# Patient Record
Sex: Female | Born: 1965 | Hispanic: Yes | Marital: Married | Smoking: Never smoker
Health system: Southern US, Community
[De-identification: ages and names within clinical notes are randomized; demographics above are authoritative.]

## PROBLEM LIST (undated history)

## (undated) DIAGNOSIS — Z8041 Family history of malignant neoplasm of ovary: Secondary | ICD-10-CM

## (undated) DIAGNOSIS — Z9221 Personal history of antineoplastic chemotherapy: Secondary | ICD-10-CM

## (undated) DIAGNOSIS — Z6841 Body Mass Index (BMI) 40.0 and over, adult: Secondary | ICD-10-CM

## (undated) DIAGNOSIS — Z923 Personal history of irradiation: Secondary | ICD-10-CM

## (undated) DIAGNOSIS — F32A Depression, unspecified: Secondary | ICD-10-CM

## (undated) DIAGNOSIS — G4730 Sleep apnea, unspecified: Secondary | ICD-10-CM

## (undated) DIAGNOSIS — F329 Major depressive disorder, single episode, unspecified: Secondary | ICD-10-CM

## (undated) DIAGNOSIS — Z800 Family history of malignant neoplasm of digestive organs: Secondary | ICD-10-CM

## (undated) HISTORY — PX: ORIF ELBOW FRACTURE: SHX5031

## (undated) HISTORY — DX: Family history of malignant neoplasm of digestive organs: Z80.0

## (undated) HISTORY — PX: OTHER SURGICAL HISTORY: SHX169

## (undated) HISTORY — DX: Family history of malignant neoplasm of ovary: Z80.41

## (undated) HISTORY — PX: BREAST LUMPECTOMY: SHX2

---

## 2007-02-27 ENCOUNTER — Inpatient Hospital Stay (HOSPITAL_COMMUNITY): Admission: AD | Admit: 2007-02-27 | Discharge: 2007-03-01 | Payer: Self-pay | Admitting: *Deleted

## 2010-09-27 LAB — CBC
HCT: 28.1 — ABNORMAL LOW
HCT: 31.8 — ABNORMAL LOW
Hemoglobin: 10.7 — ABNORMAL LOW
Hemoglobin: 9.6 — ABNORMAL LOW
MCHC: 33.8
MCHC: 34
MCV: 87.7
MCV: 87.9
Platelets: 212
Platelets: 263
RBC: 3.2 — ABNORMAL LOW
RBC: 3.62 — ABNORMAL LOW
RDW: 15.7 — ABNORMAL HIGH
RDW: 16 — ABNORMAL HIGH
WBC: 12.8 — ABNORMAL HIGH
WBC: 15.8 — ABNORMAL HIGH

## 2010-09-27 LAB — RPR: RPR Ser Ql: NONREACTIVE

## 2010-09-27 LAB — CCBB MATERNAL DONOR DRAW

## 2014-12-25 ENCOUNTER — Encounter (HOSPITAL_BASED_OUTPATIENT_CLINIC_OR_DEPARTMENT_OTHER): Payer: Self-pay | Admitting: *Deleted

## 2014-12-26 ENCOUNTER — Other Ambulatory Visit: Payer: Self-pay | Admitting: Orthopedic Surgery

## 2014-12-29 ENCOUNTER — Ambulatory Visit (HOSPITAL_BASED_OUTPATIENT_CLINIC_OR_DEPARTMENT_OTHER)
Admission: RE | Admit: 2014-12-29 | Payer: BC Managed Care – PPO | Source: Ambulatory Visit | Admitting: Orthopedic Surgery

## 2014-12-29 HISTORY — DX: Sleep apnea, unspecified: G47.30

## 2014-12-29 HISTORY — DX: Major depressive disorder, single episode, unspecified: F32.9

## 2014-12-29 HISTORY — DX: Depression, unspecified: F32.A

## 2014-12-29 SURGERY — ARTHROSCOPY, KNEE, WITH MEDIAL MENISCECTOMY
Anesthesia: Choice | Site: Knee | Laterality: Right

## 2018-06-07 HISTORY — PX: BREAST BIOPSY: SHX20

## 2018-06-16 ENCOUNTER — Other Ambulatory Visit: Payer: Self-pay | Admitting: Certified Nurse Midwife

## 2018-06-16 DIAGNOSIS — N6320 Unspecified lump in the left breast, unspecified quadrant: Secondary | ICD-10-CM

## 2018-06-16 DIAGNOSIS — N6310 Unspecified lump in the right breast, unspecified quadrant: Secondary | ICD-10-CM

## 2018-06-28 ENCOUNTER — Other Ambulatory Visit: Payer: Self-pay | Admitting: Certified Nurse Midwife

## 2018-06-28 ENCOUNTER — Ambulatory Visit
Admission: RE | Admit: 2018-06-28 | Discharge: 2018-06-28 | Disposition: A | Discharge status: Home / Self Care | Payer: BC Managed Care – PPO | Source: Ambulatory Visit | Attending: Certified Nurse Midwife | Admitting: Certified Nurse Midwife

## 2018-06-28 DIAGNOSIS — N6310 Unspecified lump in the right breast, unspecified quadrant: Secondary | ICD-10-CM

## 2018-06-28 DIAGNOSIS — N6320 Unspecified lump in the left breast, unspecified quadrant: Secondary | ICD-10-CM

## 2018-06-29 ENCOUNTER — Encounter (HOSPITAL_BASED_OUTPATIENT_CLINIC_OR_DEPARTMENT_OTHER): Payer: Self-pay | Admitting: *Deleted

## 2018-06-30 ENCOUNTER — Ambulatory Visit
Admission: RE | Admit: 2018-06-30 | Discharge: 2018-06-30 | Disposition: A | Discharge status: Home / Self Care | Payer: Self-pay | Source: Ambulatory Visit | Attending: Certified Nurse Midwife | Admitting: Certified Nurse Midwife

## 2018-06-30 ENCOUNTER — Ambulatory Visit
Admission: RE | Admit: 2018-06-30 | Discharge: 2018-06-30 | Disposition: A | Discharge status: Home / Self Care | Payer: BC Managed Care – PPO | Source: Ambulatory Visit | Attending: Certified Nurse Midwife | Admitting: Certified Nurse Midwife

## 2018-06-30 DIAGNOSIS — N6320 Unspecified lump in the left breast, unspecified quadrant: Secondary | ICD-10-CM

## 2018-07-02 ENCOUNTER — Telehealth: Payer: Self-pay | Admitting: Hematology and Oncology

## 2018-07-02 NOTE — Telephone Encounter (Signed)
Spoke to patient to confirm afternoon [facility] appointment for 7/1, packet emailed to patient

## 2018-07-05 ENCOUNTER — Encounter: Payer: Self-pay | Admitting: *Deleted

## 2018-07-05 DIAGNOSIS — C50212 Malignant neoplasm of upper-inner quadrant of left female breast: Secondary | ICD-10-CM | POA: Insufficient documentation

## 2018-07-05 DIAGNOSIS — Z170 Estrogen receptor positive status [ER+]: Secondary | ICD-10-CM | POA: Insufficient documentation

## 2018-07-06 NOTE — Progress Notes (Signed)
[place] CONSULT NOTE  Patient Care Team: Patient, [name] as PCP - General (General Practice) [name], RN as Oncology Nurse Navigator [name], RN as Oncology Nurse Navigator [name], MD as Consulting Physician (Hematology and Oncology) [name], MD as Consulting Physician (General Surgery) [name], MD as Attending Physician (Radiation Oncology)  CHIEF COMPLAINTS/PURPOSE OF CONSULTATION:  Newly diagnosed breast cancer  HISTORY OF PRESENTING ILLNESS:  Caitlin Graham 53 y.o. female is here because of recent diagnosis of invasive ductal carcinoma of the left breast. The cancer was detected on a routine screening mammogram and was not palpable prior to diagnosis. Diagnostic mammogram and [place] on 06/28/18 showed cysts in the right breast and a 1.2cm mass in the left breast at the 9 o'clock position with no left axillary adenopathy. Biopsy on 06/30/18 showed grade 1-2 invasive ductal carcinoma, HER-2 negative (1+), ER 100%, PR 60%, Ki67 50%. She presents to the clinic today for initial evaluation and consultation.   I reviewed her records extensively and collaborated the history with the patient.  SUMMARY OF ONCOLOGIC HISTORY: Oncology History  Malignant neoplasm of upper-inner quadrant of left breast in female, estrogen receptor positive ([place])  07/05/2018 Initial Diagnosis   Screening mammogram detected bilateral breast masses. Diagnostic mammogram and US showed cysts in the right breast and a 1.2cm left breast mass at the 9 o'clock position. 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    07/07/2018 Cancer Staging   Staging form: Breast, AJCC 8th Edition - Clinical stage from 07/07/2018: Stage IA (cT1c, cN0, cM0, G2, ER+, PR+, HER2-) - Signed by [name], MD on 07/07/2018     MEDICAL HISTORY:  Past Medical History:  Diagnosis Date  . Depression   . Sleep apnea    resolved with gastic bypass     SURGICAL HISTORY: Past Surgical History:  Procedure Laterality Date  . gastric bypass    . ORIF ELBOW FRACTURE      SOCIAL HISTORY: Social History   Socioeconomic History  . Marital status: Married    Spouse name: Not on file  . Number of children: Not on file  . Years of education: Not on file  . Highest education level: Not on file  Occupational History  . Not on file  Social Needs  . Financial resource strain: Not on file  . Food insecurity    Worry: Not on file    Inability: Not on file  . Transportation needs    Medical: Not on file    Non-medical: Not on file  Tobacco Use  . Smoking status: Never Smoker  . Smokeless tobacco: Never Used  Substance and Sexual Activity  . Alcohol use: Yes    Comment: 1-2  . Drug use: No  . Sexual activity: Yes    Birth control/protection: Condom  Lifestyle  . Physical activity    Days per week: Not on file    Minutes per session: Not on file  . Stress: Not on file  Relationships  . Social Herbalist on phone: Not on file    Gets together: Not on file    Attends religious service: Not on file    Active member of club or organization: Not on file    Attends meetings of clubs or organizations: Not on file    Relationship status: Not on file  . Intimate partner violence    Fear of current or  ex partner: Not on file    Emotionally abused: Not on file    Physically abused: Not on file    Forced sexual activity: Not on file  Other Topics Concern  . Not on file  Social History Narrative   ** Merged History Encounter **        FAMILY HISTORY: Family History  Problem Relation Age of Onset  . Bone cancer Paternal Grandfather   . Ovarian cancer Paternal Aunt     ALLERGIES:  is allergic to aspirin; ibuprofen; and phenergan [promethazine].  MEDICATIONS:  Current Outpatient Medications  Medication Sig Dispense Refill  . acetaminophen (TYLENOL) 325 MG tablet Take 650 mg by mouth every 6 (six) hours as needed.     . tamoxifen (NOLVADEX) 20 MG tablet Take 1 tablet (20 mg total) by mouth daily. 30 tablet 0   No current facility-administered medications for this visit.     REVIEW OF SYSTEMS:   Constitutional: Denies fevers, chills or abnormal night sweats Eyes: Denies blurriness of vision, double vision or watery eyes Ears, nose, mouth, throat, and face: Denies mucositis or sore throat Respiratory: Denies cough, dyspnea or wheezes Cardiovascular: Denies palpitation, chest discomfort or lower extremity swelling Gastrointestinal:  Denies nausea, heartburn or change in bowel habits Skin: Denies abnormal skin rashes Lymphatics: Denies new lymphadenopathy or easy bruising Neurological:Denies numbness, tingling or new weaknesses Behavioral/Psych: Mood is stable, no new changes  Breast: Denies any palpable lumps or discharge All other systems were reviewed with the patient and are negative.  PHYSICAL EXAMINATION: ECOG PERFORMANCE STATUS: 1 - Symptomatic but completely ambulatory  Vitals:   07/07/18 1241  BP: 101/70  Pulse: 68  Resp: 18  Temp: 98.5 F (36.9 C)  SpO2: 100%   Filed Weights   07/07/18 1241  Weight: 281 lb 12.8 oz (127.8 kg)    GENERAL:alert, no distress and comfortable SKIN: skin color, texture, turgor are normal, no rashes or significant lesions EYES: normal, conjunctiva are pink and non-injected, sclera clear OROPHARYNX:no exudate, no erythema and lips, buccal mucosa, and tongue normal  NECK: supple, thyroid normal size, non-tender, without nodularity LYMPH:  no palpable lymphadenopathy in the cervical, axillary or inguinal LUNGS: clear to auscultation and percussion with normal breathing effort HEART: regular rate & rhythm and no murmurs and no lower extremity edema ABDOMEN:abdomen soft, non-tender and normal bowel sounds Musculoskeletal:no cyanosis of digits and no clubbing  PSYCH: alert & oriented x 3 with fluent speech NEURO: no focal motor/sensory deficits BREAST: No  palpable nodules in breast. No palpable axillary or supraclavicular lymphadenopathy (exam performed in the presence of a chaperone)   LABORATORY DATA:  I have reviewed the data as listed Lab Results  Component Value Date   WBC 10.1 07/07/2018   HGB 12.1 07/07/2018   HCT 38.7 07/07/2018   MCV 90.6 07/07/2018   PLT 294 07/07/2018   Lab Results  Component Value Date   NA 138 07/07/2018   K 4.0 07/07/2018   CL 107 07/07/2018   CO2 23 07/07/2018    RADIOGRAPHIC STUDIES: I have personally reviewed the radiological reports and agreed with the findings in the report.  ASSESSMENT AND PLAN:  Malignant neoplasm of upper-inner quadrant of left breast in female, estrogen receptor positive ([place]) 07/05/2018:Screening mammogram detected bilateral breast masses. Diagnostic mammogram and US showed cysts in the right breast and a 1.2cm left breast mass at the 9 o'clock position. 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  Stage Ia  Pathology and radiology counseling:Discussed with the patient, the details of pathology including the type of breast cancer,the clinical staging, the significance of ER, PR and HER-2/neu receptors and the implications for treatment. After reviewing the pathology in detail, we proceeded to discuss the different treatment options between surgery, radiation, chemotherapy, antiestrogen therapies.  Recommendations: 1. Breast conserving surgery followed by 2. Oncotype DX testing to determine if chemotherapy would be of any benefit followed by 3. Adjuvant radiation therapy followed by 4. Adjuvant antiestrogen therapy  Oncotype counseling: I discussed Oncotype DX test. I explained to the patient that this is a 21 gene panel to evaluate patient tumors DNA to calculate recurrence score. This would help determine whether patient has high risk or intermediate risk or low risk breast cancer. She understands that if her tumor was found to be high  risk, she would benefit from systemic chemotherapy. If low risk, no need of chemotherapy. If she was found to be intermediate risk, we would need to evaluate the score as well as other risk factors and determine if an abbreviated chemotherapy may be of benefit.  Patient has 60 year old son.  Her husband works as a Engineer, structural in the hospital. Because it could be at least 3 weeks delay for surgery, I recommended starting her on tamoxifen.  She will stop tamoxifen 1 week prior to surgery.  We discussed the risks and benefits of tamoxifen and she is willing to proceed.  Return to clinic after surgery to discuss final pathology report and then determine if Oncotype DX testing will need to be sent. We will see her using Doximity video visit  All questions were answered. The patient knows to call the clinic with any problems, questions or concerns.   Rulon Eisenmenger, MD 07/07/2018    I, Molly Dorshimer, am acting as scribe for [name], MD.  I have reviewed the above documentation for accuracy and completeness, and I agree with the above.

## 2018-07-07 ENCOUNTER — Encounter: Payer: Self-pay | Admitting: Physical Therapy

## 2018-07-07 ENCOUNTER — Ambulatory Visit: Payer: BC Managed Care – PPO | Attending: General Surgery | Admitting: Physical Therapy

## 2018-07-07 ENCOUNTER — Encounter: Payer: Self-pay | Admitting: Licensed Clinical Social Worker

## 2018-07-07 ENCOUNTER — Inpatient Hospital Stay: Payer: BC Managed Care – PPO

## 2018-07-07 ENCOUNTER — Other Ambulatory Visit: Payer: Self-pay

## 2018-07-07 ENCOUNTER — Ambulatory Visit
Admission: RE | Admit: 2018-07-07 | Discharge: 2018-07-07 | Disposition: A | Discharge status: Home / Self Care | Payer: BC Managed Care – PPO | Source: Ambulatory Visit | Attending: Radiation Oncology | Admitting: Radiation Oncology

## 2018-07-07 ENCOUNTER — Ambulatory Visit: Payer: Self-pay | Admitting: General Surgery

## 2018-07-07 ENCOUNTER — Inpatient Hospital Stay: Payer: BC Managed Care – PPO | Attending: Hematology and Oncology | Admitting: Hematology and Oncology

## 2018-07-07 ENCOUNTER — Encounter: Payer: Self-pay | Admitting: Radiation Oncology

## 2018-07-07 ENCOUNTER — Encounter: Payer: Self-pay | Admitting: Hematology and Oncology

## 2018-07-07 ENCOUNTER — Ambulatory Visit (HOSPITAL_BASED_OUTPATIENT_CLINIC_OR_DEPARTMENT_OTHER): Payer: BC Managed Care – PPO | Admitting: Licensed Clinical Social Worker

## 2018-07-07 DIAGNOSIS — Z170 Estrogen receptor positive status [ER+]: Secondary | ICD-10-CM

## 2018-07-07 DIAGNOSIS — C50212 Malignant neoplasm of upper-inner quadrant of left female breast: Secondary | ICD-10-CM

## 2018-07-07 DIAGNOSIS — Z800 Family history of malignant neoplasm of digestive organs: Secondary | ICD-10-CM | POA: Diagnosis not present

## 2018-07-07 DIAGNOSIS — Z8041 Family history of malignant neoplasm of ovary: Secondary | ICD-10-CM | POA: Diagnosis not present

## 2018-07-07 DIAGNOSIS — R293 Abnormal posture: Secondary | ICD-10-CM | POA: Diagnosis present

## 2018-07-07 LAB — CBC WITH DIFFERENTIAL (CANCER CENTER ONLY)
Abs Immature Granulocytes: 0.08 10*3/uL — ABNORMAL HIGH (ref 0.00–0.07)
Basophils Absolute: 0.1 10*3/uL (ref 0.0–0.1)
Basophils Relative: 1 %
Eosinophils Absolute: 0.2 10*3/uL (ref 0.0–0.5)
Eosinophils Relative: 2 %
HCT: 38.7 % (ref 36.0–46.0)
Hemoglobin: 12.1 g/dL (ref 12.0–15.0)
Immature Granulocytes: 1 %
Lymphocytes Relative: 27 %
Lymphs Abs: 2.7 10*3/uL (ref 0.7–4.0)
MCH: 28.3 pg (ref 26.0–34.0)
MCHC: 31.3 g/dL (ref 30.0–36.0)
MCV: 90.6 fL (ref 80.0–100.0)
Monocytes Absolute: 0.7 10*3/uL (ref 0.1–1.0)
Monocytes Relative: 7 %
Neutro Abs: 6.3 10*3/uL (ref 1.7–7.7)
Neutrophils Relative %: 62 %
Platelet Count: 294 10*3/uL (ref 150–400)
RBC: 4.27 MIL/uL (ref 3.87–5.11)
RDW: 15.1 % (ref 11.5–15.5)
WBC Count: 10.1 10*3/uL (ref 4.0–10.5)
nRBC: 0 % (ref 0.0–0.2)

## 2018-07-07 LAB — CMP (CANCER CENTER ONLY)
ALT: 9 U/L (ref 0–44)
AST: 9 U/L — ABNORMAL LOW (ref 15–41)
Albumin: 3.4 g/dL — ABNORMAL LOW (ref 3.5–5.0)
Alkaline Phosphatase: 83 U/L (ref 38–126)
Anion gap: 8 (ref 5–15)
BUN: 12 mg/dL (ref 6–20)
CO2: 23 mmol/L (ref 22–32)
Calcium: 8.7 mg/dL — ABNORMAL LOW (ref 8.9–10.3)
Chloride: 107 mmol/L (ref 98–111)
Creatinine: 0.82 mg/dL (ref 0.44–1.00)
GFR, Est AFR Am: >60 mL/min (ref >60)
GFR, Estimated: >60 mL/min (ref >60)
Glucose, Bld: 126 mg/dL — ABNORMAL HIGH (ref 70–99)
Potassium: 4 mmol/L (ref 3.5–5.1)
Sodium: 138 mmol/L (ref 135–145)
Total Bilirubin: 0.4 mg/dL (ref 0.3–1.2)
Total Protein: 7.1 g/dL (ref 6.5–8.1)

## 2018-07-07 MED ORDER — TAMOXIFEN CITRATE 20 MG PO TABS: 20.0000 mg | ORAL_TABLET | Freq: Every day | ORAL | 0 refills | Status: DC

## 2018-07-07 NOTE — Progress Notes (Signed)
Radiation Oncology         [phone] ________________________________  Initial outpatient Consultation  Name: Caitlin Graham MRN: [id number]  Date: 07/07/2018  DOB: March 10, 1965  GY:FVCBSWH, [name]  [name], MD   REFERRING PHYSICIAN: Autumn Messing III, MD  DIAGNOSIS:    ICD-10-CM   1. Malignant neoplasm of upper-inner quadrant of left breast in female, estrogen receptor positive ([place])  C50.212    Z17.0    Cancer Staging Malignant neoplasm of upper-inner quadrant of left breast in female, estrogen receptor positive ([place]) Staging form: Breast, AJCC 8th Edition - Clinical stage from 07/07/2018: Stage IA (cT1c, cN0, cM0, G2, ER+, PR+, HER2-) - Signed by [name], MD on 07/07/2018  CHIEF COMPLAINT: Here to discuss management of left breast cancer  HISTORY OF PRESENT ILLNESS::Caitlin Graham is a 53 y.o. female who presented with left breast abnormality on the following imaging: mammogram.  Symptoms, if any, at that time, were: none.  Ultrasound of breast revealed left breast 44m mass, 9:00, axilla neg.   Biopsy of left breast 9:00 mass showed invasive ductal carcinoma.  ER status: +; PR status +, Her2 status neg; Grade 1-2.  (Right breast cysts were also seen on imaging,  No concern of malignancy in right breast).  She works as an iAstronomerfor hearing-impaired students in [employer]  PREVIOUS RADIATION THERAPY: No  PAST MEDICAL HISTORY:  has a past medical history of Depression, Family history of ovarian cancer, Family history of stomach cancer, and Sleep apnea.    PAST SURGICAL HISTORY: Past Surgical History:  Procedure Laterality Date   gastric bypass     ORIF ELBOW FRACTURE      FAMILY HISTORY: family history includes Bone cancer in her paternal grandfather; Ovarian cancer in her paternal aunt; Stomach cancer in her cousin.  SOCIAL HISTORY:  reports that she has never smoked. She has never used smokeless tobacco. She reports current alcohol use. She reports  that she does not use drugs.  ALLERGIES: Aspirin, Ibuprofen, and Phenergan [promethazine]  MEDICATIONS:  Current Outpatient Medications  Medication Sig Dispense Refill   acetaminophen (TYLENOL) 325 MG tablet Take 650 mg by mouth every 6 (six) hours as needed.     tamoxifen (NOLVADEX) 20 MG tablet Take 1 tablet (20 mg total) by mouth daily. 30 tablet 0   No current facility-administered medications for this encounter.     REVIEW OF SYSTEMS: As above   PHYSICAL EXAM:  Vitals - 1 value per visit 76/07/5914 SYSTOLIC 1384 DIASTOLIC 70  Pulse 68  Temperature 98.5  Respirations 18  Weight (lb) 281.8  Height '5\' 6"'$   BMI 45.48  VISIT REPORT     General: Alert and oriented, in no acute distress  Psychiatric: Judgment and insight are intact. Affect is appropriate. Breasts: no obvious palpable masses appreciated in the breasts or axillae    ECOG = 0  0 - Asymptomatic (Fully active, able to carry on all predisease activities without restriction)  1 - Symptomatic but completely ambulatory (Restricted in physically strenuous activity but ambulatory and able to carry out work of a light or sedentary nature. For example, light housework, office work)  2 - Symptomatic, <50% in bed during the day (Ambulatory and capable of all self care but unable to carry out any work activities. Up and about more than 50% of waking hours)  3 - Symptomatic, >50% in bed, but not bedbound (Capable of only limited self-care, confined to bed or chair 50% or more  of waking hours)  4 - Bedbound (Completely disabled. Cannot carry on any self-care. Totally confined to bed or chair)  5 - Death   Eustace Pen MM, Creech RH, Tormey DC, et al. [phone]). "Toxicity and response criteria of the [facility] Group". [place] Oncol. 5 (6): 649-55   LABORATORY DATA:  Lab Results  Component Value Date   WBC 10.1 07/07/2018   HGB 12.1 07/07/2018   HCT 38.7 07/07/2018   MCV 90.6 07/07/2018   PLT 294  07/07/2018   CMP     Component Value Date/Time   NA 138 07/07/2018 1207   K 4.0 07/07/2018 1207   CL 107 07/07/2018 1207   CO2 23 07/07/2018 1207   GLUCOSE 126 (H) 07/07/2018 1207   BUN 12 07/07/2018 1207   CREATININE 0.82 07/07/2018 1207   CALCIUM 8.7 (L) 07/07/2018 1207   PROT 7.1 07/07/2018 1207   ALBUMIN 3.4 (L) 07/07/2018 1207   AST 9 (L) 07/07/2018 1207   ALT 9 07/07/2018 1207   ALKPHOS 83 07/07/2018 1207   BILITOT 0.4 07/07/2018 1207   GFRNONAA >60 07/07/2018 1207   GFRAA >60 07/07/2018 1207         RADIOGRAPHY: US Breast Ltd Uni Left Inc Axilla  Result Date: 06/28/2018 CLINICAL DATA:  Patient was called back from screening mammogram for bilateral breast masses. EXAM: DIGITAL DIAGNOSTIC BILATERAL MAMMOGRAM WITH TOMO ULTRASOUND BILATERAL BREAST COMPARISON:  Previous exam(s). ACR Breast Density Category b: There are scattered areas of fibroglandular density. FINDINGS: Additional imaging of both breast was performed. There is a 9 mm mass in the 6 o'clock region of the right breast and a spiculated 1.2 cm mass in the 9 o'clock region of the left breast. There are no malignant type microcalcifications in either breast. Targeted ultrasound is performed, showing a benign cluster of cysts (apocrine metaplasia) in the right breast at 6 o'clock 2 cm from the nipple measuring 1.0 x 0.6 x 1.0 cm. A few smaller scattered cysts are seen in this area. No solid mass or abnormal shadowing detected. Sonographic evaluation of the left breast shows an irregular hypoechoic mass at 9 o'clock 9 cm from the nipple measuring 1.2 x 0.9 x 1.1 cm. Sonographic evaluation of the left axilla does not show any enlarged adenopathy. IMPRESSION: Suspicious mass in the 9 o'clock region of the left breast. No evidence of malignancy in the right breast. RECOMMENDATION: Ultrasound-guided core biopsy of the mass in the 9 o'clock region of the left breast is recommended. The biopsy will be scheduled the patient's  convenience. I have discussed the findings and recommendations with the patient. Results were also provided in writing at the conclusion of the visit. If applicable, a reminder letter will be sent to the patient regarding the next appointment. BI-RADS CATEGORY  5: Highly suggestive of malignancy. Electronically Signed   By: Lillia Mountain M.D.   On: 06/28/2018 11:58   US Breast Ltd Uni Right Inc Axilla  Result Date: 06/28/2018 CLINICAL DATA:  Patient was called back from screening mammogram for bilateral breast masses. EXAM: DIGITAL DIAGNOSTIC BILATERAL MAMMOGRAM WITH TOMO ULTRASOUND BILATERAL BREAST COMPARISON:  Previous exam(s). ACR Breast Density Category b: There are scattered areas of fibroglandular density. FINDINGS: Additional imaging of both breast was performed. There is a 9 mm mass in the 6 o'clock region of the right breast and a spiculated 1.2 cm mass in the 9 o'clock region of the left breast. There are no malignant type microcalcifications in either breast. Targeted ultrasound  is performed, showing a benign cluster of cysts (apocrine metaplasia) in the right breast at 6 o'clock 2 cm from the nipple measuring 1.0 x 0.6 x 1.0 cm. A few smaller scattered cysts are seen in this area. No solid mass or abnormal shadowing detected. Sonographic evaluation of the left breast shows an irregular hypoechoic mass at 9 o'clock 9 cm from the nipple measuring 1.2 x 0.9 x 1.1 cm. Sonographic evaluation of the left axilla does not show any enlarged adenopathy. IMPRESSION: Suspicious mass in the 9 o'clock region of the left breast. No evidence of malignancy in the right breast. RECOMMENDATION: Ultrasound-guided core biopsy of the mass in the 9 o'clock region of the left breast is recommended. The biopsy will be scheduled the patient's convenience. I have discussed the findings and recommendations with the patient. Results were also provided in writing at the conclusion of the visit. If applicable, a reminder letter will  be sent to the patient regarding the next appointment. BI-RADS CATEGORY  5: Highly suggestive of malignancy. Electronically Signed   By: Lillia Mountain M.D.   On: 06/28/2018 11:58   Mm Diag Breast Tomo Bilateral  Result Date: 06/28/2018 CLINICAL DATA:  Patient was called back from screening mammogram for bilateral breast masses. EXAM: DIGITAL DIAGNOSTIC BILATERAL MAMMOGRAM WITH TOMO ULTRASOUND BILATERAL BREAST COMPARISON:  Previous exam(s). ACR Breast Density Category b: There are scattered areas of fibroglandular density. FINDINGS: Additional imaging of both breast was performed. There is a 9 mm mass in the 6 o'clock region of the right breast and a spiculated 1.2 cm mass in the 9 o'clock region of the left breast. There are no malignant type microcalcifications in either breast. Targeted ultrasound is performed, showing a benign cluster of cysts (apocrine metaplasia) in the right breast at 6 o'clock 2 cm from the nipple measuring 1.0 x 0.6 x 1.0 cm. A few smaller scattered cysts are seen in this area. No solid mass or abnormal shadowing detected. Sonographic evaluation of the left breast shows an irregular hypoechoic mass at 9 o'clock 9 cm from the nipple measuring 1.2 x 0.9 x 1.1 cm. Sonographic evaluation of the left axilla does not show any enlarged adenopathy. IMPRESSION: Suspicious mass in the 9 o'clock region of the left breast. No evidence of malignancy in the right breast. RECOMMENDATION: Ultrasound-guided core biopsy of the mass in the 9 o'clock region of the left breast is recommended. The biopsy will be scheduled the patient's convenience. I have discussed the findings and recommendations with the patient. Results were also provided in writing at the conclusion of the visit. If applicable, a reminder letter will be sent to the patient regarding the next appointment. BI-RADS CATEGORY  5: Highly suggestive of malignancy. Electronically Signed   By: Lillia Mountain M.D.   On: 06/28/2018 11:58   Mm Clip  Placement Left  Result Date: 06/30/2018 CLINICAL DATA:  Status post ultrasound-guided core biopsy of mass in the 9 o'clock location of the LEFT breast. EXAM: DIAGNOSTIC LEFT MAMMOGRAM POST ULTRASOUND BIOPSY COMPARISON:  06/28/2018 and earlier FINDINGS: Mammographic images were obtained following ultrasound guided biopsy of mass in the 9 o'clock location of the LEFT breast and placement of a ribbon shaped clip. The clip is in expected location within the mass in the MEDIAL aspect of the LEFT breast. IMPRESSION: Tissue marker clip in the expected location following biopsy. Final Assessment: Post Procedure Mammograms for Marker Placement Electronically Signed   By: Nolon Nations M.D.   On: 06/30/2018 14:35   [place]  Lt Breast Bx W Loc Dev 1st Lesion Img Bx Spec US Guide  Addendum Date: 07/01/2018   ADDENDUM REPORT: 07/01/2018 13:09 ADDENDUM: Pathology revealed GRADE I-II INVASIVE DUCTAL CARCINOMA of the Left breast, 9 o'clock, (ribbon clip). This was found to be concordant by Dr. Nolon Nations. Pathology results were discussed with the patient by telephone. The patient reported doing well after the biopsy with tenderness at the site. Post biopsy instructions and care were reviewed and questions were answered. The patient was encouraged to call [facility] for any additional concerns. The patient was referred to [facility] on July 07, 2018. Pathology results reported by Terie Purser, RN on 07/01/2018. Electronically Signed   By: Nolon Nations M.D.   On: 07/01/2018 13:09   Result Date: 07/01/2018 CLINICAL DATA:  Patient presents for ultrasound-guided core biopsy of mass in the LEFT breast. EXAM: ULTRASOUND GUIDED LEFT BREAST CORE NEEDLE BIOPSY COMPARISON:  Previous exam(s). FINDINGS: I met with the patient and we discussed the procedure of ultrasound-guided biopsy, including benefits and alternatives. We  discussed the high likelihood of a successful procedure. We discussed the risks of the procedure, including infection, bleeding, tissue injury, clip migration, and inadequate sampling. Informed written consent was given. The usual time-out protocol was performed immediately prior to the procedure. Lesion quadrant: 9 o'clock LEFT breast Using sterile technique and 1% Lidocaine as local anesthetic, under direct ultrasound visualization, a 12 gauge spring-loaded device was used to perform biopsy of mass in the 9 o'clock location of the LEFT breast using a inferior to superior approach. At the conclusion of the procedure a ribbon shaped tissue marker clip was deployed into the biopsy cavity. Follow up 2 view mammogram was performed and dictated separately. IMPRESSION: Ultrasound guided biopsy of LEFT breast mass. No apparent complications. Electronically Signed: By: Nolon Nations M.D. On: 06/30/2018 14:36      IMPRESSION/PLAN: Left breast cancer.  She has been discussed at our multidisciplinary tumor board.  The consensus is that she would be a good candidate for breast conservation. I talked to her about the option of a mastectomy and informed her that her expected overall survival would be equivalent between mastectomy and breast conservation, based upon randomized controlled data. She is enthusiastic about breast conservation.  It was a pleasure meeting the patient today. We discussed the risks, benefits, and side effects of radiotherapy. I recommend radiotherapy to the left breast to reduce her risk of locoregional recurrence by 2/3.  We discussed that radiation would take approximately 4 weeks to complete and that I would give the patient a few weeks to heal following surgery before starting treatment planning.  If chemotherapy were to be given, this would precede radiotherapy. We spoke about acute effects including skin irritation and fatigue as well as much less common late effects including internal  organ injury or irritation. We spoke about the latest technology that is used to minimize the risk of late effects for patients undergoing radiotherapy to the breast or chest wall. No guarantees of treatment were given. The patient is enthusiastic about proceeding with treatment. I look forward to participating in the patient's care.  I will await her referral back to me for postoperative follow-up and eventual CT simulation/treatment planning.   Since she lives near [facility] in [facility] we discussed the option of getting her treatment there.  Right now she seems to prefer driving to [facility] given the  technology to [facility] offers, including breathhold technique for cardiac sparing.  __________________________________________   [name], MD

## 2018-07-07 NOTE — Patient Instructions (Signed)

## 2018-07-07 NOTE — Assessment & Plan Note (Signed)
07/05/2018:Screening mammogram detected bilateral breast masses. Diagnostic mammogram and US showed cysts in the right breast and a 1.2cm left breast mass at the 9 o'clock position. 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  Stage Ia  Pathology and radiology counseling:Discussed with the patient, the details of pathology including the type of breast cancer,the clinical staging, the significance of ER, PR and HER-2/neu receptors and the implications for treatment. After reviewing the pathology in detail, we proceeded to discuss the different treatment options between surgery, radiation, chemotherapy, antiestrogen therapies.  Recommendations: 1. Breast conserving surgery followed by 2. Oncotype DX testing to determine if chemotherapy would be of any benefit followed by 3. Adjuvant radiation therapy followed by 4. Adjuvant antiestrogen therapy  Oncotype counseling: I discussed Oncotype DX test. I explained to the patient that this is a 21 gene panel to evaluate patient tumors DNA to calculate recurrence score. This would help determine whether patient has high risk or intermediate risk or low risk breast cancer. She understands that if her tumor was found to be high risk, she would benefit from systemic chemotherapy. If low risk, no need of chemotherapy. If she was found to be intermediate risk, we would need to evaluate the score as well as other risk factors and determine if an abbreviated chemotherapy may be of benefit.  Return to clinic after surgery to discuss final pathology report and then determine if Oncotype DX testing will need to be sent. We will see her using D[employer] video visit

## 2018-07-07 NOTE — Therapy (Signed)
[place], [place], [address] Phone: [phone]   Fax:  [phone]  Physical Therapy Evaluation  Patient Details  Name: Caitlin Graham MRN: [id number] Date of Birth: [birth year] Referring Provider (PT): Dr. Autumn Messing   Encounter Date: 07/07/2018  PT End of Session - 07/07/18 1632    Visit Number  1    Number of Visits  2    Date for PT Re-Evaluation  09/01/18    PT Start Time  4193    PT Stop Time  1525    PT Time Calculation (min)  26 min    Activity Tolerance  Patient tolerated treatment well    Behavior During Therapy  [facility] for tasks assessed/performed       Past Medical History:  Diagnosis Date  . Depression   . Family history of ovarian cancer   . Family history of stomach cancer   . Sleep apnea    resolved with gastic bypass    Past Surgical History:  Procedure Laterality Date  . gastric bypass    . ORIF ELBOW FRACTURE      There were no vitals filed for this visit.   Subjective Assessment - 07/07/18 1621    Subjective  Patient reports she is here today to be seen by her medical team for her newly diagnosed left breast cancer.    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 It measures 1.2 cm and is located in the upper inner quadrant. It is ER/PR positive and HER2 negative with a Ki67 of 50%. She has a plate in her left forearm from surgery in 2000 and had bariatric surgery 16 years ago.    Patient Stated Goals  Reduce lymphedema risk and learn post op shoulder ROM HEP    Currently in Pain?  No/denies         [facility] PT Assessment - 07/07/18 0001      Assessment   Medical Diagnosis  Left breast cancer    Referring Provider (PT)  Dr. Autumn Messing    Onset Date/Surgical Date  06/15/18    Hand Dominance  Right    Prior Therapy  None      Precautions   Precautions  Other (comment)    Precaution Comments  active  cancer      Restrictions   Weight Bearing Restrictions  No      Balance Screen   Has the patient fallen in the past 6 months  No    Has the patient had a decrease in activity level because of a fear of falling?   No    Is the patient reluctant to leave their home because of a fear of falling?   No      Home Environment   Living Environment  Private residence    Living Arrangements  Spouse/significant other;Children   Husband and 32 y.o. son   Available Help at Discharge  Family      Prior Function   Level of Independence  Independent    Vocation  Full time employment    Vocation Requirements  Sign language interpreter at [employer]  She does not exercise      Cognition   Overall Cognitive Status  Within Functional Limits for tasks assessed      Posture/Postural Control   Posture/Postural Control  Postural limitations    Postural Limitations  Rounded Shoulders;Forward  head      ROM / Strength   AROM / PROM / Strength  AROM;Strength      AROM   Overall AROM Comments  Cervical AROM WNL    AROM Assessment Site  Shoulder    Right/Left Shoulder  Right;Left    Right Shoulder Extension  47 Degrees    Right Shoulder Flexion  139 Degrees    Right Shoulder ABduction  158 Degrees    Right Shoulder Internal Rotation  68 Degrees    Right Shoulder External Rotation  87 Degrees    Left Shoulder Extension  45 Degrees    Left Shoulder Flexion  137 Degrees    Left Shoulder ABduction  160 Degrees    Left Shoulder Internal Rotation  75 Degrees    Left Shoulder External Rotation  90 Degrees      Strength   Overall Strength  Within functional limits for tasks performed        LYMPHEDEMA/ONCOLOGY QUESTIONNAIRE - 07/07/18 1630      Type   Cancer Type  Left breast      Lymphedema Assessments   Lymphedema Assessments  Upper extremities      Right Upper Extremity Lymphedema   10 cm Proximal to Olecranon Process  36.5 cm    Olecranon Process  27.9 cm    10 cm  Proximal to Ulnar Styloid Process  25.6 cm    Just Proximal to Ulnar Styloid Process  17.4 cm    Across Hand at [employer]  18.9 cm    At [place] of 2nd Digit  6.1 cm      Left Upper Extremity Lymphedema   10 cm Proximal to Olecranon Process  37.4 cm    Olecranon Process  27.7 cm    10 cm Proximal to Ulnar Styloid Process  25.2 cm    Just Proximal to Ulnar Styloid Process  17.3 cm    Across Hand at [employer]  19.1 cm    At [place] of 2nd Digit  5.9 cm             Objective measurements completed on examination: See above findings.         Patient was instructed today in a home exercise program today for post op shoulder range of motion. These included active assist shoulder flexion in sitting, scapular retraction, wall walking with shoulder abduction, and hands behind head external rotation.  She was encouraged to do these twice a day, holding 3 seconds and repeating 5 times when permitted by her physician.         PT Education - 07/07/18 1631    Education Details  Lymphedema risk reduction and post op shoulder ROM HEP    Person(s) Educated  Patient    Methods  Explanation;Demonstration;Handout    Comprehension  Returned demonstration;Verbalized understanding          PT Long Term Goals - 07/07/18 1649      PT LONG TERM GOAL #1   Title  Patient will demonstrate she has regained shoulder ROM and function post operatively compared to baselines.    Time  8    Period  Weeks    Status  New    Target Date  09/01/18      Breast Clinic Goals - 07/07/18 1648      Patient will be able to verbalize understanding of pertinent lymphedema risk reduction practices relevant to her diagnosis specifically related to skin care.   Time  1    Period  Days    Status  Achieved    Target Date  07/07/18      Patient will be able to return demonstrate and/or verbalize understanding of the post-op home exercise program related to regaining shoulder range of motion.   Time   1    Period  Days    Status  Achieved    Target Date  07/07/18      Patient will be able to verbalize understanding of the importance of attending the postoperative After Breast Cancer Class for further lymphedema risk reduction education and therapeutic exercise.   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 It measures 1.2 cm and is located in the upper inner quadrant. It is ER/PR positive and HER2 negative with a Ki67 of 50%. She has a plate in her left forearm from surgery in 2000 and had bariatric surgery 16 years ago. Her multidisciplinary medical team met prior to her assessments to determine a recommended treatment plan. She is planning to have a left lumpectomy and sentinel node biopsy followed by Oncotype testing, radiation, and anti-estrogen therapy. She will benefit from a post op reassessment to determine needs.    Stability/Clinical Decision Making  Stable/Uncomplicated    Clinical Decision Making  Low    Rehab Potential  Excellent    PT Frequency  --   Eval and 1 f/u visit   PT Treatment/Interventions  ADLs/Self Care Home Management;Therapeutic exercise;Patient/family education    PT Next Visit Plan  Will reassess 3-4 weeks post op to determine needs    PT Home Exercise Plan  Post op shoulder ROM HEP    Consulted and Agree with Plan of Care  Patient       Patient will benefit from skilled therapeutic intervention in order to improve the following deficits and impairments:  Pain, Impaired UE functional use, Decreased knowledge of precautions, Postural dysfunction, Decreased range of motion  Visit Diagnosis: 1. Malignant neoplasm of upper-inner quadrant of left breast in female, estrogen receptor positive ([place])   2. Abnormal posture    Patient will follow up at outpatient cancer  rehab 3-4 weeks following surgery.  If the patient requires physical therapy at that time, a specific plan will be dictated and sent to the referring physician for approval. The patient was educated today on appropriate basic range of motion exercises to begin post operatively and the importance of attending the After Breast Cancer class following surgery.  Patient was educated today on lymphedema risk reduction practices as it pertains to recommendations that will benefit the patient immediately following surgery.  She verbalized good understanding.       Problem List Patient Active Problem List   Diagnosis Date Noted  . Family history of ovarian cancer   . Family history of stomach cancer   . Malignant neoplasm of upper-inner quadrant of left breast in female, estrogen receptor positive ([place]) 07/05/2018   Annia Friendly, PT 07/07/18 4:53 PM  [place], [place], [address] Phone: [phone]   Fax:  [phone]  Name: Reygan Heagle MRN: [id number] Date of Birth: Jun 18, 1965

## 2018-07-07 NOTE — Progress Notes (Signed)
REFERRING PROVIDER: [name], MD [place],  [place] [address]  PRIMARY PROVIDER:  Patient, [name]  PRIMARY REASON FOR VISIT:  1. Malignant neoplasm of upper-inner quadrant of left breast in female, estrogen receptor positive ([place])   2. Family history of ovarian cancer   3. Family history of stomach cancer    I connected with Ms. Lewellyn on 07/07/2018 at 3:45 PM EDT by Webex and verified that I am speaking with the correct person using two identifiers.    Patient location: clinic Provider location: clinic   HISTORY OF PRESENT ILLNESS:   Ms. Yingling, a 53 y.o. female, was seen for a [facility] cancer genetics consultation at the request of Dr. Lindi Adie due to a personal and family history of cancer.  Ms. Billet presents to clinic today to discuss the possibility of a hereditary predisposition to cancer, genetic testing, and to further clarify her future cancer risks, as well as potential cancer risks for family members.   In 2020, at the age of 58, Ms. Wisman was diagnosed with IDC of the left breast, ER+, PR+, Her2-. The treatment plan includes surgery, Oncotype DX testing, adjuvant radiation therapy and adjuvant antiestrogen therapy.  CANCER HISTORY:  Oncology History  Malignant neoplasm of upper-inner quadrant of left breast in female, estrogen receptor positive ([place])  07/05/2018 Initial Diagnosis   Screening mammogram detected bilateral breast masses. Diagnostic mammogram and US showed cysts in the right breast and a 1.2cm left breast mass at the 9 o'clock position. 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    07/07/2018 Cancer Staging   Staging form: Breast, AJCC 8th Edition - Clinical stage from 07/07/2018: Stage IA (cT1c, cN0, cM0, G2, ER+, PR+, HER2-) - Signed by [name], MD on 07/07/2018     RISK FACTORS:  Menarche was at age 48.  First live birth at age 37.   Ovaries intact: yes.  Hysterectomy: no.    Past Medical History:  Diagnosis Date  . Depression   . Family history of ovarian cancer   . Family history of stomach cancer   . Sleep apnea    resolved with gastic bypass    Past Surgical History:  Procedure Laterality Date  . gastric bypass    . ORIF ELBOW FRACTURE      Social History   Socioeconomic History  . Marital status: Married    Spouse name: Not on file  . Number of children: Not on file  . Years of education: Not on file  . Highest education level: Not on file  Occupational History  . Not on file  Social Needs  . Financial resource strain: Not on file  . Food insecurity    Worry: Not on file    Inability: Not on file  . Transportation needs    Medical: Not on file    Non-medical: Not on file  Tobacco Use  . Smoking status: Never Smoker  . Smokeless tobacco: Never Used  Substance and Sexual Activity  . Alcohol use: Yes    Comment: 1-2  . Drug use: No  . Sexual activity: Yes    Birth control/protection: Condom  Lifestyle  . Physical activity    Days per week: Not on file    Minutes per session: Not on file  . Stress: Not on file  Relationships  . Social Herbalist on phone: Not on file    Gets together: Not on  file    Attends religious service: Not on file    Active member of club or organization: Not on file    Attends meetings of clubs or organizations: Not on file    Relationship status: Not on file  Other Topics Concern  . Not on file  Social History Narrative   ** Merged History Encounter **         FAMILY HISTORY:  We obtained a detailed, 4-generation family history.  Significant diagnoses are listed below: Family History  Problem Relation Age of Onset  . Bone cancer Paternal Grandfather   . Ovarian cancer Paternal Aunt   . Stomach cancer Cousin        dx young   Ms. Laser has one son, age 74. She has 2 sisters and 2 brothers, no history of cancer.  Ms. Hanneman's mother is living at 69, no history of cancer. The  patient had 5 maternal uncles, 1 maternal aunt. No known cancers in these individuals, or in her maternal cousins. Her maternal grandmother died in her late 18s, maternal grandfather died in his 42s.  Ms. Raisch's father is living at 90, no history of cancer. The patient had 1 paternal aunt, 1 paternal uncle. Her aunt had ovarian cancer in her 7s and died in her 20s. Her uncle had a son (patient's cousin) who died from stomach cancer, unsure of the age. She believes this uncle had other children who had cancers as well. Her paternal grandmother died in her 29s, paternal grandfather died in his 37s and had bone cancer.  Ms. Ballo is unaware of previous family history of genetic testing for hereditary cancer risks.There is no reported Ashkenazi Jewish ancestry. There is no known consanguinity.  GENETIC COUNSELING ASSESSMENT: Ms. Wiens is a 53 y.o. female with a personal and family history which is somewhat suggestive of a hereditary cancer syndrome and predisposition to cancer. We, therefore, discussed and recommended the following at today's visit.   DISCUSSION: We discussed that 5 - 10% of breast cancer is hereditary, with most cases associated with BRCA1/BRCA2.  There are other genes that can be associated with hereditary breast cancer syndromes.  These include PALB2, ATM, CHEK2.    We reviewed the characteristics, features and inheritance patterns of hereditary cancer syndromes. We also discussed genetic testing, including the appropriate family members to test, the process of testing, insurance coverage and turn-around-time for results. We discussed the implications of a negative, positive and/or variant of uncertain significant result. In order to get genetic test results in a timely manner so that Ms. Basden can use these genetic test results for surgical decisions, we recommended Ms. Kimberlin pursue genetic testing for the Breast Cancer STAT Panel. Once complete, we recommend Ms. Labine pursue  reflex genetic testing to the Common Hereditary Cancers gene panel.   The STAT Breast cancer panel offered by [facility] includes sequencing and rearrangement analysis for the following 9 genes:  ATM, BRCA1, BRCA2, CDH1, CHEK2, PALB2, PTEN, STK11 and TP53.    The Common Hereditary Cancers Panel offered by [facility] includes sequencing and/or deletion duplication testing of the following 48 genes: APC, ATM, AXIN2, BARD1, BMPR1A, BRCA1, BRCA2, BRIP1, CDH1, CDKN2A (p14ARF), CDKN2A (p16INK4a), CKD4, CHEK2, CTNNA1, DICER1, EPCAM (Deletion/duplication testing only), GREM1 (promoter region deletion/duplication testing only), KIT, MEN1, MLH1, MSH2, MSH3, MSH6, MUTYH, NBN, NF1, NHTL1, PALB2, PDGFRA, PMS2, POLD1, POLE, PTEN, RAD50, RAD51C, RAD51D, RNF43, SDHB, SDHC, SDHD, SMAD4, SMARCA4. STK11, TP53, TSC1, TSC2, and VHL.  The following genes were evaluated for sequence  changes only: SDHA and HOXB13 c.251G>A variant only.  Based on Ms. Vosler's personal and family history of cancer, she meets medical criteria for genetic testing. Despite that she meets criteria, she may still have an out of pocket cost.   PLAN: After considering the risks, benefits, and limitations, Ms. Hollings provided informed consent to pursue genetic testing and the blood sample was sent to [facility] for analysis of the Breast Cancer STAT Panel + Common Hereditary Cancers Panel. Results should be available within approximately 1 weeks' time, at which point they will be disclosed by telephone to Ms. Berent, as will any additional recommendations warranted by these results. Ms. Washinton will receive a summary of her genetic counseling visit and a copy of her results once available. This information will also be available in Epic.   Based on Ms. Donathan's family history, we recommended her paternal relatives  have genetic counseling and testing. Ms. Morrisette will let us know if we can be of any assistance in coordinating genetic counseling  and/or testing for these family members.   Lastly, we encouraged Ms. Vitrano to remain in contact with cancer genetics annually so that we can continuously update the family history and inform her of any changes in cancer genetics and testing that may be of benefit for this family.   Ms. Heber's questions were answered to her satisfaction today. Our contact information was provided should additional questions or concerns arise. Thank you for the referral and allowing [place] to share in the care of your patient.   Faith Rogue, MS Genetic Counselor [email or web address] Phone: [phone]  The patient was seen for a total of 15 minutes in virtual genetic counseling.  Drs. Magrinat, Lindi Adie and/or Burr Medico were available for discussion regarding this case.

## 2018-07-12 ENCOUNTER — Encounter: Payer: Self-pay | Admitting: General Practice

## 2018-07-12 ENCOUNTER — Telehealth: Payer: Self-pay | Admitting: *Deleted

## 2018-07-12 NOTE — Telephone Encounter (Signed)
Spoke to pt concerning [place] from 7.1.20. Denies questions or concerns regarding dx or tx care plan. Pt did discuss possibility of changing her sx plan based on genetic testing results. Informed pt that results should be back between 10-14 days. If pt would like to change her sx decision we will get her scheduled to see Dr. [name] for surgical plan. Encourage pt to call with further questions or needs. Received verbal understanding.

## 2018-07-12 NOTE — Progress Notes (Signed)
[place] Psychosocial Distress Screening Spiritual Care  Met with Claiborne Rigg by phone following Breast Multidisciplinary Clinic to introduce [facility] team/resources, reviewing distress screen per protocol.  The patient scored a 5 on the Psychosocial Distress Thermometer which indicates moderate distress. Also assessed for distress and other psychosocial needs.   ONCBCN DISTRESS SCREENING 07/12/2018  Screening Type Initial Screening  Distress experienced in past week (1-10) 5  Family Problem type Other (comment)  Emotional problem type Nervousness/Anxiety  Referral to support programs Yes   Per pt, she has good emotional support from her extended family (all in [place]) as well as her husband at home. She is helping her son, 97, cope and understand the curable nature of her dx.    Follow up needed: No. Once she is clear about surgical path (lumpectomy vs mastectomy), Ms Dolinski may reach out to request an [employer]. She is aware of ongoing [facility] team/programming availability and plans to call as needed/desired.    [place], [place], [facility] Pager [phone] Voicemail [phone]

## 2018-07-14 ENCOUNTER — Other Ambulatory Visit: Payer: Self-pay | Admitting: General Surgery

## 2018-07-14 DIAGNOSIS — C50212 Malignant neoplasm of upper-inner quadrant of left female breast: Secondary | ICD-10-CM

## 2018-07-15 ENCOUNTER — Telehealth: Payer: Self-pay | Admitting: Hematology and Oncology

## 2018-07-15 ENCOUNTER — Encounter: Payer: Self-pay | Admitting: *Deleted

## 2018-07-15 NOTE — Telephone Encounter (Signed)
Scheduled appt per 7/09 sch message- pt is aware of appt date and time.  

## 2018-07-19 ENCOUNTER — Ambulatory Visit: Payer: Self-pay | Admitting: Licensed Clinical Social Worker

## 2018-07-19 ENCOUNTER — Telehealth: Payer: Self-pay | Admitting: Licensed Clinical Social Worker

## 2018-07-19 ENCOUNTER — Encounter: Payer: Self-pay | Admitting: Licensed Clinical Social Worker

## 2018-07-19 DIAGNOSIS — C50212 Malignant neoplasm of upper-inner quadrant of left female breast: Secondary | ICD-10-CM

## 2018-07-19 DIAGNOSIS — Z800 Family history of malignant neoplasm of digestive organs: Secondary | ICD-10-CM

## 2018-07-19 DIAGNOSIS — Z1379 Encounter for other screening for genetic and chromosomal anomalies: Secondary | ICD-10-CM | POA: Insufficient documentation

## 2018-07-19 DIAGNOSIS — Z8041 Family history of malignant neoplasm of ovary: Secondary | ICD-10-CM

## 2018-07-19 NOTE — Progress Notes (Signed)
HPI:  Ms. Dowling was previously seen in the [facility] due to a personal and family history of cancer and concerns regarding a hereditary predisposition to cancer. Please refer to our prior cancer genetics clinic note for more information regarding our discussion, assessment and recommendations, at the time. Ms. Laughery's recent genetic test results were disclosed to her, as were recommendations warranted by these results. These results and recommendations are discussed in more detail below.  CANCER HISTORY:  Oncology History  Malignant neoplasm of upper-inner quadrant of left breast in female, estrogen receptor positive ([place])  07/05/2018 Initial Diagnosis   Screening mammogram detected bilateral breast masses. Diagnostic mammogram and US showed cysts in the right breast and a 1.2cm left breast mass at the 9 o'clock position. 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    07/07/2018 Cancer Staging   Staging form: Breast, AJCC 8th Edition - Clinical stage from 07/07/2018: Stage IA (cT1c, cN0, cM0, G2, ER+, PR+, HER2-) - Signed by [name], MD on 07/07/2018    Genetic Testing   Negative testing. No pathogenic variants identified on the [facility] Common Hereditary Cancers Panel. The Common Hereditary Cancers Panel offered by [facility] includes sequencing and/or deletion duplication testing of the following 47 genes: APC, ATM, AXIN2, BARD1, BMPR1A, BRCA1, BRCA2, BRIP1, CDH1, CDKN2A (p14ARF), CDKN2A (p16INK4a), CKD4, CHEK2, CTNNA1, DICER1, EPCAM (Deletion/duplication testing only), GREM1 (promoter region deletion/duplication testing only), KIT, MEN1, MLH1, MSH2, MSH3, MSH6, MUTYH, NBN, NF1, NHTL1, PALB2, PDGFRA, PMS2, POLD1, POLE, PTEN, RAD50, RAD51C, RAD51D, SDHB, SDHC, SDHD, SMAD4, SMARCA4. STK11, TP53, TSC1, TSC2, and VHL.  The following genes were evaluated for sequence changes only: SDHA and HOXB13 c.251G>A variant only. The report date is  07/18/2018.     FAMILY HISTORY:  We obtained a detailed, 4-generation family history.  Significant diagnoses are listed below: Family History  Problem Relation Age of Onset   Bone cancer Paternal Grandfather    Ovarian cancer Paternal Aunt    Stomach cancer Cousin        dx young    Ms. Rosetti has one son, age 44. She has 2 sisters and 2 brothers, no history of cancer.  Ms. Leer's mother is living at 25, no history of cancer. The patient had 5 maternal uncles, 1 maternal aunt. No known cancers in these individuals, or in her maternal cousins. Her maternal grandmother died in her late 29s, maternal grandfather died in his 98s.  Ms. Mcelhinney's father is living at 41, no history of cancer. The patient had 1 paternal aunt, 1 paternal uncle. Her aunt had ovarian cancer in her 79s and died in her 3s. Her uncle had a son (patient's cousin) who died from stomach cancer, unsure of the age. She believes this uncle had other children who had cancers as well. Her paternal grandmother died in her 1s, paternal grandfather died in his 36s and had bone cancer.  Ms. Varma is unaware of previous family history of genetic testing for hereditary cancer risks.There is no reported Ashkenazi Jewish ancestry. There is no known consanguinity.  GENETIC TEST RESULTS: Genetic testing reported out on 07/18/2018 through the [facility] Breast Cancer STAT Panel + Common Hereditary cancer panel found no pathogenic mutations.   The STAT Breast cancer panel offered by [facility] includes sequencing and rearrangement analysis for the following 9 genes:  ATM, BRCA1, BRCA2, CDH1, CHEK2, PALB2, PTEN, STK11 and TP53.    The Common Hereditary Cancers Panel offered by [facility] includes sequencing  and/or deletion duplication testing of the following 47 genes: APC, ATM, AXIN2, BARD1, BMPR1A, BRCA1, BRCA2, BRIP1, CDH1, CDKN2A (p14ARF), CDKN2A (p16INK4a), CKD4, CHEK2, CTNNA1, DICER1, EPCAM (Deletion/duplication testing only), GREM1  (promoter region deletion/duplication testing only), KIT, MEN1, MLH1, MSH2, MSH3, MSH6, MUTYH, NBN, NF1, NHTL1, PALB2, PDGFRA, PMS2, POLD1, POLE, PTEN, RAD50, RAD51C, RAD51D, SDHB, SDHC, SDHD, SMAD4, SMARCA4. STK11, TP53, TSC1, TSC2, and VHL.  The following genes were evaluated for sequence changes only: SDHA and HOXB13 c.251G>A variant only.  The test report has been scanned into EPIC and is located under the Molecular Pathology section of the Results Review tab.  A portion of the result report is included below for reference.     We discussed with Ms. Notte that because current genetic testing is not perfect, it is possible there may be a gene mutation in one of these genes that current testing cannot detect, but that chance is small.  We also discussed, that there could be another gene that has not yet been discovered, or that we have not yet tested, that is responsible for the cancer diagnoses in the family. It is also possible there is a hereditary cause for the cancer in the family that Ms. Prochnow did not inherit and therefore was not identified in her testing.  Therefore, it is important to remain in touch with cancer genetics in the future so that we can continue to offer Ms. Rech the most up to date genetic testing.   ADDITIONAL GENETIC TESTING: We discussed with Ms. Rigaud that her genetic testing was fairly extensive.  If there are genes identified to increase cancer risk that can be analyzed in the future, we would be happy to discuss and coordinate this testing at that time.    CANCER SCREENING RECOMMENDATIONS: Ms. Appleman's test result is considered negative (normal).  This means that we have not identified a hereditary cause for her  personal and family history of cancer at this time. Most cancers happen by chance and this negative test suggests that her cancer may fall into this category.    While reassuring, this does not definitively rule out a hereditary predisposition to  cancer. It is still possible that there could be genetic mutations that are undetectable by current technology. There could be genetic mutations in genes that have not been tested or identified to increase cancer risk.  Therefore, it is recommended she continue to follow the cancer management and screening guidelines provided by her oncology and primary healthcare provider.   An individual's cancer risk and medical management are not determined by genetic test results alone. Overall cancer risk assessment incorporates additional factors, including personal medical history, family history, and any available genetic information that may result in a personalized plan for cancer prevention and surveillance  RECOMMENDATIONS FOR FAMILY MEMBERS:  Relatives in this family might be at some increased risk of developing cancer, over the general population risk, simply due to the family history of cancer.  We recommended female relatives in this family have a yearly mammogram beginning at age 43, or 48 years younger than the earliest onset of cancer, an annual clinical breast exam, and perform monthly breast self-exams. Female relatives in this family should also have a gynecological exam as recommended by their primary provider. All family members should have a colonoscopy by age 73, or as directed by their physicians.  It is also possible there is a hereditary cause for the cancer in Ms. Winters's family that she did not inherit and therefore was  not identified in her.  Based on Ms. Retz's family history, we recommended her paternal relatives have genetic counseling and testing. Ms. Adcox will let us know if we can be of any assistance in coordinating genetic counseling and/or testing for these family members.  FOLLOW-UP: Lastly, we discussed with Ms. Mcbean that cancer genetics is a rapidly advancing field and it is possible that new genetic tests will be appropriate for her and/or her family members in the  future. We encouraged her to remain in contact with cancer genetics on an annual basis so we can update her personal and family histories and let her know of advances in cancer genetics that may benefit this family.   Our contact number was provided. Ms. Komatsu's questions were answered to her satisfaction, and she knows she is welcome to call us at anytime with additional questions or concerns.   Faith Rogue, MS Genetic Counselor [email or web address] Phone: [phone]

## 2018-07-19 NOTE — Telephone Encounter (Signed)
Revealed negative genetic testing. This normal result is reassuring and indicates that it is unlikely Caitlin Graham's cancer is due to a hereditary cause.  It is unlikely that there is an increased risk of another cancer due to a mutation in one of these genes.  However, genetic testing is not perfect, and cannot definitively rule out a hereditary cause.  It will be important for her to keep in contact with genetics to learn if any additional testing may be needed in the future.

## 2018-07-27 ENCOUNTER — Other Ambulatory Visit: Payer: Self-pay

## 2018-07-27 ENCOUNTER — Encounter (HOSPITAL_BASED_OUTPATIENT_CLINIC_OR_DEPARTMENT_OTHER): Payer: Self-pay | Admitting: *Deleted

## 2018-07-31 ENCOUNTER — Other Ambulatory Visit (HOSPITAL_COMMUNITY)
Admission: RE | Admit: 2018-07-31 | Discharge: 2018-07-31 | Disposition: A | Discharge status: Home / Self Care | Payer: BC Managed Care – PPO | Source: Ambulatory Visit | Attending: General Surgery | Admitting: General Surgery

## 2018-07-31 DIAGNOSIS — Z1159 Encounter for screening for other viral diseases: Secondary | ICD-10-CM | POA: Insufficient documentation

## 2018-07-31 LAB — SARS CORONAVIRUS 2 (TAT 6-24 HRS): SARS Coronavirus 2: NEGATIVE

## 2018-08-03 ENCOUNTER — Ambulatory Visit
Admission: RE | Admit: 2018-08-03 | Discharge: 2018-08-03 | Disposition: A | Discharge status: Home / Self Care | Payer: BC Managed Care – PPO | Source: Ambulatory Visit | Attending: General Surgery | Admitting: General Surgery

## 2018-08-03 ENCOUNTER — Encounter (HOSPITAL_BASED_OUTPATIENT_CLINIC_OR_DEPARTMENT_OTHER)
Admission: RE | Admit: 2018-08-03 | Discharge: 2018-08-03 | Disposition: A | Discharge status: Home / Self Care | Payer: BC Managed Care – PPO | Source: Ambulatory Visit | Attending: General Surgery | Admitting: General Surgery

## 2018-08-03 ENCOUNTER — Other Ambulatory Visit: Payer: Self-pay

## 2018-08-03 DIAGNOSIS — C50212 Malignant neoplasm of upper-inner quadrant of left female breast: Secondary | ICD-10-CM

## 2018-08-03 DIAGNOSIS — Z170 Estrogen receptor positive status [ER+]: Secondary | ICD-10-CM

## 2018-08-03 NOTE — Progress Notes (Signed)
Anesthesia consult per Dr. Kalman Shan, will proceed with surgery as scheduled.   Ensure drink and soap given with instructions, pt verbalized understanding.

## 2018-08-04 ENCOUNTER — Other Ambulatory Visit: Payer: Self-pay

## 2018-08-04 ENCOUNTER — Encounter (HOSPITAL_BASED_OUTPATIENT_CLINIC_OR_DEPARTMENT_OTHER)
Admission: RE | Disposition: A | Discharge status: Home / Self Care | Payer: Self-pay | Source: Home / Self Care | Attending: General Surgery

## 2018-08-04 ENCOUNTER — Ambulatory Visit (HOSPITAL_BASED_OUTPATIENT_CLINIC_OR_DEPARTMENT_OTHER)
Admission: RE | Admit: 2018-08-04 | Discharge: 2018-08-04 | Disposition: A | Discharge status: Home / Self Care | Payer: BC Managed Care – PPO | Attending: General Surgery | Admitting: General Surgery

## 2018-08-04 ENCOUNTER — Encounter (HOSPITAL_BASED_OUTPATIENT_CLINIC_OR_DEPARTMENT_OTHER): Payer: Self-pay

## 2018-08-04 ENCOUNTER — Encounter (HOSPITAL_COMMUNITY)
Admission: RE | Admit: 2018-08-04 | Discharge: 2018-08-04 | Disposition: A | Discharge status: Home / Self Care | Payer: BC Managed Care – PPO | Source: Ambulatory Visit | Attending: General Surgery | Admitting: General Surgery

## 2018-08-04 ENCOUNTER — Ambulatory Visit (HOSPITAL_BASED_OUTPATIENT_CLINIC_OR_DEPARTMENT_OTHER): Payer: BC Managed Care – PPO | Admitting: Anesthesiology

## 2018-08-04 ENCOUNTER — Ambulatory Visit
Admission: RE | Admit: 2018-08-04 | Discharge: 2018-08-04 | Disposition: A | Discharge status: Home / Self Care | Payer: BC Managed Care – PPO | Source: Ambulatory Visit | Attending: General Surgery | Admitting: General Surgery

## 2018-08-04 DIAGNOSIS — D0512 Intraductal carcinoma in situ of left breast: Secondary | ICD-10-CM | POA: Diagnosis not present

## 2018-08-04 DIAGNOSIS — G4730 Sleep apnea, unspecified: Secondary | ICD-10-CM | POA: Insufficient documentation

## 2018-08-04 DIAGNOSIS — Z170 Estrogen receptor positive status [ER+]: Secondary | ICD-10-CM | POA: Insufficient documentation

## 2018-08-04 DIAGNOSIS — Z6841 Body Mass Index (BMI) 40.0 and over, adult: Secondary | ICD-10-CM | POA: Diagnosis not present

## 2018-08-04 DIAGNOSIS — F329 Major depressive disorder, single episode, unspecified: Secondary | ICD-10-CM | POA: Insufficient documentation

## 2018-08-04 DIAGNOSIS — Z833 Family history of diabetes mellitus: Secondary | ICD-10-CM | POA: Diagnosis not present

## 2018-08-04 DIAGNOSIS — Z8249 Family history of ischemic heart disease and other diseases of the circulatory system: Secondary | ICD-10-CM | POA: Diagnosis not present

## 2018-08-04 DIAGNOSIS — Z9049 Acquired absence of other specified parts of digestive tract: Secondary | ICD-10-CM | POA: Insufficient documentation

## 2018-08-04 DIAGNOSIS — Z9884 Bariatric surgery status: Secondary | ICD-10-CM | POA: Insufficient documentation

## 2018-08-04 DIAGNOSIS — C50212 Malignant neoplasm of upper-inner quadrant of left female breast: Secondary | ICD-10-CM

## 2018-08-04 DIAGNOSIS — E669 Obesity, unspecified: Secondary | ICD-10-CM | POA: Insufficient documentation

## 2018-08-04 HISTORY — DX: Body Mass Index (BMI) 40.0 and over, adult: Z684

## 2018-08-04 HISTORY — PX: BREAST LUMPECTOMY: SHX2

## 2018-08-04 HISTORY — PX: BREAST LUMPECTOMY WITH RADIOACTIVE SEED AND SENTINEL LYMPH NODE BIOPSY: SHX6550

## 2018-08-04 LAB — POCT PREGNANCY, URINE: Preg Test, Ur: NEGATIVE

## 2018-08-04 SURGERY — BREAST LUMPECTOMY WITH RADIOACTIVE SEED AND SENTINEL LYMPH NODE BIOPSY
Anesthesia: General | Site: Breast | Laterality: Left

## 2018-08-04 MED ORDER — ONDANSETRON HCL 4 MG/2ML IJ SOLN
INTRAMUSCULAR | Status: DC | PRN
  Administered 2018-08-04: 4 mg via INTRAVENOUS

## 2018-08-04 MED ORDER — CEFAZOLIN SODIUM-DEXTROSE 1-4 GM/50ML-% IV SOLN
INTRAVENOUS | Status: AC
  Filled 2018-08-04: qty 50

## 2018-08-04 MED ORDER — CEFAZOLIN SODIUM-DEXTROSE 2-4 GM/100ML-% IV SOLN
2.0000 g | INTRAVENOUS | Status: AC
  Administered 2018-08-04: 14:00:00 3 g via INTRAVENOUS

## 2018-08-04 MED ORDER — CHLORHEXIDINE GLUCONATE CLOTH 2 % EX PADS: 6.0000 | MEDICATED_PAD | Freq: Once | CUTANEOUS | Status: DC

## 2018-08-04 MED ORDER — DEXAMETHASONE SODIUM PHOSPHATE 4 MG/ML IJ SOLN
INTRAMUSCULAR | Status: DC | PRN
  Administered 2018-08-04: 10 mg via INTRAVENOUS

## 2018-08-04 MED ORDER — BUPIVACAINE LIPOSOME 1.3 % IJ SUSP
INTRAMUSCULAR | Status: DC | PRN
  Administered 2018-08-04: 10 mL

## 2018-08-04 MED ORDER — ONDANSETRON HCL 4 MG/2ML IJ SOLN
INTRAMUSCULAR | Status: AC
  Filled 2018-08-04: qty 2

## 2018-08-04 MED ORDER — FENTANYL CITRATE (PF) 100 MCG/2ML IJ SOLN
50.0000 ug | INTRAMUSCULAR | Status: DC | PRN
  Administered 2018-08-04: 100 ug via INTRAVENOUS
  Administered 2018-08-04: 50 ug via INTRAVENOUS

## 2018-08-04 MED ORDER — LIDOCAINE 2% (20 MG/ML) 5 ML SYRINGE
INTRAMUSCULAR | Status: AC
  Filled 2018-08-04: qty 5

## 2018-08-04 MED ORDER — TECHNETIUM TC 99M SULFUR COLLOID FILTERED
1.0000 | Freq: Once | INTRAVENOUS | Status: AC | PRN
  Administered 2018-08-04: 13:00:00 1 via INTRADERMAL

## 2018-08-04 MED ORDER — SCOPOLAMINE 1 MG/3DAYS TD PT72: 1.0000 | MEDICATED_PATCH | Freq: Once | TRANSDERMAL | Status: DC

## 2018-08-04 MED ORDER — PHENYLEPHRINE HCL (PRESSORS) 10 MG/ML IV SOLN
INTRAVENOUS | Status: DC | PRN
  Administered 2018-08-04: 120 ug via INTRAVENOUS

## 2018-08-04 MED ORDER — SUCCINYLCHOLINE 20MG/ML (10ML) SYRINGE FOR MEDFUSION PUMP - OPTIME
INTRAMUSCULAR | Status: DC | PRN
  Administered 2018-08-04: 140 mg via INTRAVENOUS

## 2018-08-04 MED ORDER — CEFAZOLIN SODIUM-DEXTROSE 2-4 GM/100ML-% IV SOLN
INTRAVENOUS | Status: AC
  Filled 2018-08-04: qty 100

## 2018-08-04 MED ORDER — ACETAMINOPHEN 500 MG PO TABS
ORAL_TABLET | ORAL | Status: AC
  Filled 2018-08-04: qty 2

## 2018-08-04 MED ORDER — MIDAZOLAM HCL 2 MG/2ML IJ SOLN
INTRAMUSCULAR | Status: AC
  Filled 2018-08-04: qty 2

## 2018-08-04 MED ORDER — FENTANYL CITRATE (PF) 100 MCG/2ML IJ SOLN
INTRAMUSCULAR | Status: AC
  Filled 2018-08-04: qty 2

## 2018-08-04 MED ORDER — PROPOFOL 10 MG/ML IV BOLUS
INTRAVENOUS | Status: AC
  Filled 2018-08-04: qty 20

## 2018-08-04 MED ORDER — HYDROCODONE-ACETAMINOPHEN 5-325 MG PO TABS: 1.0000 | ORAL_TABLET | Freq: Four times a day (QID) | ORAL | 0 refills | Status: DC | PRN

## 2018-08-04 MED ORDER — ACETAMINOPHEN 500 MG PO TABS
1000.0000 mg | ORAL_TABLET | ORAL | Status: AC
  Administered 2018-08-04: 1000 mg via ORAL

## 2018-08-04 MED ORDER — LIDOCAINE HCL (CARDIAC) PF 100 MG/5ML IV SOSY
PREFILLED_SYRINGE | INTRAVENOUS | Status: DC | PRN
  Administered 2018-08-04: 100 mg via INTRAVENOUS

## 2018-08-04 MED ORDER — MIDAZOLAM HCL 2 MG/2ML IJ SOLN
1.0000 mg | INTRAMUSCULAR | Status: DC | PRN
  Administered 2018-08-04: 2 mg via INTRAVENOUS

## 2018-08-04 MED ORDER — PROPOFOL 10 MG/ML IV BOLUS
INTRAVENOUS | Status: DC | PRN
  Administered 2018-08-04: 200 mg via INTRAVENOUS

## 2018-08-04 MED ORDER — GABAPENTIN 300 MG PO CAPS
ORAL_CAPSULE | ORAL | Status: AC
  Filled 2018-08-04: qty 1

## 2018-08-04 MED ORDER — BUPIVACAINE HCL (PF) 0.5 % IJ SOLN
INTRAMUSCULAR | Status: DC | PRN
  Administered 2018-08-04: 15 mL

## 2018-08-04 MED ORDER — BUPIVACAINE-EPINEPHRINE 0.25% -1:200000 IJ SOLN
INTRAMUSCULAR | Status: DC | PRN
  Administered 2018-08-04: 30 mL

## 2018-08-04 MED ORDER — DEXAMETHASONE SODIUM PHOSPHATE 10 MG/ML IJ SOLN
INTRAMUSCULAR | Status: AC
  Filled 2018-08-04: qty 1

## 2018-08-04 MED ORDER — PROPOFOL 500 MG/50ML IV EMUL
INTRAVENOUS | Status: DC | PRN
  Administered 2018-08-04: 35 ug/kg/min via INTRAVENOUS

## 2018-08-04 MED ORDER — LACTATED RINGERS IV SOLN
INTRAVENOUS | Status: DC
  Administered 2018-08-04 (×2): via INTRAVENOUS

## 2018-08-04 MED ORDER — GABAPENTIN 300 MG PO CAPS
300.0000 mg | ORAL_CAPSULE | ORAL | Status: AC
  Administered 2018-08-04: 300 mg via ORAL

## 2018-08-04 SURGICAL SUPPLY — 47 items
APPLIER CLIP 9.375 MED OPEN (MISCELLANEOUS) ×2
BINDER BREAST 3XL (GAUZE/BANDAGES/DRESSINGS) ×2 IMPLANT
BLADE SURG 15 STRL LF DISP TIS (BLADE) ×1 IMPLANT
BLADE SURG 15 STRL SS (BLADE) ×1
CANISTER SUC SOCK COL 7IN (MISCELLANEOUS) IMPLANT
CANISTER SUCT 1200ML W/VALVE (MISCELLANEOUS) ×2 IMPLANT
CHLORAPREP W/TINT 26 (MISCELLANEOUS) ×2 IMPLANT
CLIP APPLIE 9.375 MED OPEN (MISCELLANEOUS) ×1 IMPLANT
COVER BACK TABLE REUSABLE LG (DRAPES) ×2 IMPLANT
COVER MAYO STAND REUSABLE (DRAPES) ×2 IMPLANT
COVER PROBE W GEL 5X96 (DRAPES) ×2 IMPLANT
COVER WAND RF STERILE (DRAPES) IMPLANT
DECANTER SPIKE VIAL GLASS SM (MISCELLANEOUS) IMPLANT
DERMABOND ADVANCED (GAUZE/BANDAGES/DRESSINGS) ×1
DERMABOND ADVANCED .7 DNX12 (GAUZE/BANDAGES/DRESSINGS) ×1 IMPLANT
DRAPE LAPAROSCOPIC ABDOMINAL (DRAPES) ×2 IMPLANT
DRAPE UTILITY XL STRL (DRAPES) ×2 IMPLANT
DRSG PAD ABDOMINAL 8X10 ST (GAUZE/BANDAGES/DRESSINGS) ×4 IMPLANT
ELECT COATED BLADE 2.86 ST (ELECTRODE) ×2 IMPLANT
ELECT REM PT RETURN 9FT ADLT (ELECTROSURGICAL) ×2
ELECTRODE REM PT RTRN 9FT ADLT (ELECTROSURGICAL) ×1 IMPLANT
GLOVE BIO SURGEON STRL SZ7 (GLOVE) ×2 IMPLANT
GLOVE BIO SURGEON STRL SZ7.5 (GLOVE) ×2 IMPLANT
GLOVE BIOGEL PI IND STRL 7.0 (GLOVE) ×1 IMPLANT
GLOVE BIOGEL PI INDICATOR 7.0 (GLOVE) ×1
GLOVE EXAM NITRILE MD LF STRL (GLOVE) ×2 IMPLANT
GOWN STRL REUS W/ TWL LRG LVL3 (GOWN DISPOSABLE) ×3 IMPLANT
GOWN STRL REUS W/TWL LRG LVL3 (GOWN DISPOSABLE) ×3
ILLUMINATOR WAVEGUIDE N/F (MISCELLANEOUS) IMPLANT
KIT MARKER MARGIN INK (KITS) ×2 IMPLANT
LIGHT WAVEGUIDE WIDE FLAT (MISCELLANEOUS) ×2 IMPLANT
NDL SAFETY ECLIPSE 18X1.5 (NEEDLE) IMPLANT
NEEDLE HYPO 18GX1.5 SHARP (NEEDLE)
NEEDLE HYPO 25X1 1.5 SAFETY (NEEDLE) ×2 IMPLANT
NS IRRIG 1000ML POUR BTL (IV SOLUTION) ×2 IMPLANT
PACK BASIN DAY SURGERY FS (CUSTOM PROCEDURE TRAY) ×2 IMPLANT
PENCIL BUTTON HOLSTER BLD 10FT (ELECTRODE) ×2 IMPLANT
SLEEVE SCD COMPRESS KNEE MED (MISCELLANEOUS) ×2 IMPLANT
SPONGE LAP 18X18 RF (DISPOSABLE) ×2 IMPLANT
SUT MON AB 4-0 PC3 18 (SUTURE) ×6 IMPLANT
SUT SILK 2 0 SH (SUTURE) IMPLANT
SUT VICRYL 3-0 CR8 SH (SUTURE) ×2 IMPLANT
SYR CONTROL 10ML LL (SYRINGE) ×2 IMPLANT
TOWEL GREEN STERILE FF (TOWEL DISPOSABLE) ×2 IMPLANT
TRAY FAXITRON CT DISP (TRAY / TRAY PROCEDURE) ×2 IMPLANT
TUBE CONNECTING 20X1/4 (TUBING) ×2 IMPLANT
YANKAUER SUCT BULB TIP NO VENT (SUCTIONS) ×2 IMPLANT

## 2018-08-04 NOTE — Op Note (Signed)
08/04/2018  3:29 PM  PATIENT:  Caitlin Graham  53 y.o. female  PRE-OPERATIVE DIAGNOSIS:  LEFT BREAST CANCER  POST-OPERATIVE DIAGNOSIS:  LEFT BREAST CANCER  PROCEDURE:  Procedure(s): LEFT BREAST LUMPECTOMY WITH RADIOACTIVE SEED LOCALIZATION AND DEEP LEFT AXILLARY SENTINEL LYMPH NODE BIOPSY (Left)  SURGEON:  Surgeon(s) and Role:    * [name], MD - Primary  PHYSICIAN ASSISTANT:   ASSISTANTS: none   ANESTHESIA:   local and general  EBL:  15 mL   BLOOD ADMINISTERED:none  DRAINS: none   LOCAL MEDICATIONS USED:  MARCAINE     SPECIMEN:  Source of Specimen:  left breast tissue with sentinel node  DISPOSITION OF SPECIMEN:  PATHOLOGY  COUNTS:  YES  TOURNIQUET:  * No tourniquets in log *  DICTATION: .Dragon Dictation   After informed consent was obtained the patient was brought to the operating room and placed in the supine position on the operating table.  After adequate induction of general anesthesia the patient's left chest, breast, and axillary area were prepped with ChloraPrep, allowed to dry, and draped in usual sterile manner.  An appropriate timeout was performed.  Previously an I-125 seed was placed in the inner aspect of the left breast to mark an area of invasive breast cancer.  Earlier in the day the patient also underwent injection of 1 mCi of technetium sulfur colloid in the subareolar position of the left breast.  The neoprobe was initially set to I-125 in the area of radioactivity was readily identified in the medial left breast.  The neoprobe was then set to technetium and an area of radioactivity was readily identified in the left axilla.  This area was infiltrated with quarter percent Marcaine.  A small transversely oriented incision was made with a 15 blade knife overlying the area of radioactivity in the axilla.  The incision was carried through the skin and subcutaneous tissue sharply with the electrocautery until the deep left axillary space was entered.  The  neoprobe was then used to direct blunt hemostat dissection until I was able to identify a hot lymph node.  The lymph node was excised sharply with the electrocautery and the lymphatics and small vessels were controlled with clips.  When the lymph node was removed it appeared as though there may be 2 and possibly 3 lymph nodes within the specimen.  Ex vivo counts on this sentinel node were approximately 600.  No other hot or palpable lymph nodes were identified in the left axilla.  Hemostasis was achieved using the Bovie electrocautery.  The deep layer of the wound was then closed with interrupted 3-0 Vicryl stitches.  The skin was closed with a running 4-0 Monocryl subcuticular stitch.  Attention was then turned to the left breast.  The neoprobe was set to I-125 in the area of radioactivity was identified.  The area of the seed was marked on the skin the area around this was infiltrated with quarter percent Marcaine.  A curvilinear incision was then made along the inner aspect of the areola of the left breast.  The incision was carried through the skin and subcutaneous tissue sharply with the electrocautery.  Dissection was then carried throughout the medial left breast between the breast tissue layer and the subcutaneous fat as if doing a mastectomy.  The dissection was carried all the way to the medial edge of the breast.  Next the area of the radioactive seed was marked on the anterior surface of the breast tissue with a Vicryl stitch.  The dissection was then carried around the area of the radioactive seed with the electrocautery.  Once the specimen was removed from the patient it was oriented with the appropriate paint colors.  A specimen radiograph was obtained that showed the clip and seed to be near the center of the specimen.  The specimen was then sent to pathology for further evaluation.  The cavity was irrigated with saline and infiltrated with more quarter percent Marcaine.  The cavity was marked with  clips.  The cavity was then closed with layers of interrupted 3-0 Vicryl stitches.  The skin was then closed with interrupted 4-0 Monocryl subcuticular stitches.  Dermabond dressings were applied.  The patient tolerated the procedure well.  At the end of the case all needle sponge and instrument counts were correct.  The patient was then awakened and taken to recovery in stable condition.  PLAN OF CARE: Discharge to home after PACU  PATIENT DISPOSITION:  PACU - hemodynamically stable.   Delay start of Pharmacological VTE agent (>24hrs) due to surgical blood loss or risk of bleeding: not applicable

## 2018-08-04 NOTE — Progress Notes (Signed)
Assisted Dr. Fransisco Beau with left, ultrasound guided, pectoralis block. Side rails up, monitors on throughout procedure. See vital signs in flow sheet. Tolerated Procedure well.

## 2018-08-04 NOTE — Anesthesia Preprocedure Evaluation (Addendum)
Anesthesia Evaluation  Patient identified by MRN, date of birth, ID band Patient awake    Reviewed: Allergy & Precautions, NPO status , Patient's Chart, lab work & pertinent test results  History of Anesthesia Complications Negative for: history of anesthetic complications  Airway Mallampati: II  TM Distance: >3 FB Neck ROM: Full    Dental  (+) Teeth Intact, Dental Advisory Given   Pulmonary sleep apnea ,    breath sounds clear to auscultation       Cardiovascular negative cardio ROS   Rhythm:Regular Rate:Normal     Neuro/Psych PSYCHIATRIC DISORDERS Depression negative neurological ROS     GI/Hepatic Neg liver ROS,  S/p gastric bypass    Endo/Other  Morbid obesity  Renal/GU negative Renal ROS     Musculoskeletal negative musculoskeletal ROS (+)   Abdominal (+) + obese,   Peds  Hematology negative hematology ROS (+)   Anesthesia Other Findings   Reproductive/Obstetrics  Breast cancer                             Anesthesia Physical Anesthesia Plan  ASA: III  Anesthesia Plan: General   Post-op Pain Management:  Regional for Post-op pain   Induction: Intravenous  PONV Risk Score and Plan: 3 and Treatment may vary due to age or medical condition, Ondansetron, Dexamethasone and Midazolam  Airway Management Planned:   Additional Equipment: None  Intra-op Plan:   Post-operative Plan: Extubation in OR  Informed Consent:   Plan Discussed with: CRNA and Anesthesiologist  Anesthesia Plan Comments:        Anesthesia Quick Evaluation

## 2018-08-04 NOTE — Transfer of Care (Signed)
Immediate Anesthesia Transfer of Care Note  Patient: Caitlin Graham  Procedure(s) Performed: LEFT BREAST LUMPECTOMY WITH RADIOACTIVE SEED AND SENTINEL LYMPH NODE BIOPSY (Left Breast)  Patient Location: PACU  Anesthesia Type:General  Level of Consciousness: awake, alert  and oriented  Airway & Oxygen Therapy: Patient Spontanous Breathing and Patient connected to face mask oxygen  Post-op Assessment: Report given to RN and Post -op Vital signs reviewed and stable  Post vital signs: Reviewed and stable  Last Vitals:  Vitals Value Taken Time  BP    Temp    Pulse 84 08/04/18 1531  Resp 17 08/04/18 1531  SpO2 99 % 08/04/18 1531  Vitals shown include unvalidated device data.  Last Pain:  Vitals:   08/04/18 1152  TempSrc: Oral  PainSc: 0-No pain         Complications: No apparent anesthesia complications

## 2018-08-04 NOTE — H&P (Signed)
Caitlin Graham  Location: [facility] Patient #: [id number] DOB: January 03, 1966 Undefined / Language: [name] / Race: White Female   History of Present Illness  The patient is a 53 year old female who presents with breast cancer.We are asked to see the patient in consultation by Dr. Lindi Adie to evaluate her for a new left breast cancer. The patient is a 53 year old white female who presents with a mass on screening mammogram in the UIQ of the left breast. Mass on right breast was a cyst. The left mass was 1.2 cm and was an IDC that was ER and PR+ and Her2- with a Ki67 of 50%. She has no family history of breast cancer and does not smoke.   Past Surgical History  Gallbladder Surgery - Laparoscopic  Gastric Bypass   Diagnostic Studies History  Colonoscopy  never Mammogram  within last year Pap Smear  1-5 years ago  Medication History Medications Reconciled  Social History  Alcohol use  Occasional alcohol use. No caffeine use  No drug use  Tobacco use  Never smoker.  Family History Diabetes Mellitus  Mother. Hypertension  Mother.  Pregnancy / Birth History Age at menarche  56 years. Gravida  1 Maternal age  48-40 Para  1 Regular periods     Review of Systems  General Not Present- Appetite Loss, Chills, Fatigue, Fever, Night Sweats, Weight Gain and Weight Loss. Skin Not Present- Change in Wart/Mole, Dryness, Hives, Jaundice, New Lesions, Non-Healing Wounds, Rash and Ulcer. HEENT Not Present- Earache, Hearing Loss, Hoarseness, Nose Bleed, Oral Ulcers, Ringing in the Ears, Seasonal Allergies, Sinus Pain, Sore Throat, Visual Disturbances, Wears glasses/contact lenses and Yellow Eyes. Respiratory Not Present- Bloody sputum, Chronic Cough, Difficulty Breathing, Snoring and Wheezing. Breast Not Present- Breast Mass, Breast Pain, Nipple Discharge and Skin Changes. Cardiovascular Not Present- Chest Pain, Difficulty Breathing Lying Down, Leg Cramps,  Palpitations, Rapid Heart Rate, Shortness of Breath and Swelling of Extremities. Gastrointestinal Not Present- Abdominal Pain, Bloating, Bloody Stool, Change in Bowel Habits, Chronic diarrhea, Constipation, Difficulty Swallowing, Excessive gas, Gets full quickly at meals, Hemorrhoids, Indigestion, Nausea, Rectal Pain and Vomiting. Female Genitourinary Not Present- Frequency, Nocturia, Painful Urination, Pelvic Pain and Urgency. Musculoskeletal Not Present- Back Pain, Joint Pain, Joint Stiffness, Muscle Pain, Muscle Weakness and Swelling of Extremities. Neurological Not Present- Decreased Memory, Fainting, Headaches, Numbness, Seizures, Tingling, Tremor, Trouble walking and Weakness. Psychiatric Not Present- Anxiety, Bipolar, Change in Sleep Pattern, Depression, Fearful and Frequent crying. Endocrine Not Present- Cold Intolerance, Excessive Hunger, Hair Changes, Heat Intolerance, Hot flashes and New Diabetes. Hematology Not Present- Blood Thinners, Easy Bruising, Excessive bleeding, Gland problems, HIV and Persistent Infections.   Physical Exam General Mental Status-Alert. General Appearance-Consistent with stated age. Hydration-Well hydrated. Voice-Normal.  Head and Neck Head-normocephalic, atraumatic with no lesions or palpable masses. Trachea-midline. Thyroid Gland Characteristics - normal size and consistency.  Eye Eyeball - Bilateral-Extraocular movements intact. Sclera/Conjunctiva - Bilateral-No scleral icterus.  Chest and Lung Exam Chest and lung exam reveals -quiet, even and easy respiratory effort with no use of accessory muscles and on auscultation, normal breath sounds, no adventitious sounds and normal vocal resonance. Inspection Chest Wall - Normal. Back - normal.  Breast Note: There is no palpable mass in either breast. There is no palpable axillary, supraclavicular, or cervical lymphadenopathy   Cardiovascular Cardiovascular examination reveals  -normal heart sounds, regular rate and rhythm with no murmurs and normal pedal pulses bilaterally.  Abdomen Inspection Inspection of the abdomen reveals - No Hernias. Skin -  Scar - no surgical scars. Palpation/Percussion Palpation and Percussion of the abdomen reveal - Soft, Non Tender, No Rebound tenderness, No Rigidity (guarding) and No hepatosplenomegaly. Auscultation Auscultation of the abdomen reveals - Bowel sounds normal.  Neurologic Neurologic evaluation reveals -alert and oriented x 3 with no impairment of recent or remote memory. Mental Status-Normal.  Musculoskeletal Normal Exam - Left-Upper Extremity Strength Normal and Lower Extremity Strength Normal. Normal Exam - Right-Upper Extremity Strength Normal and Lower Extremity Strength Normal.  Lymphatic Head & Neck  General Head & Neck Lymphatics: Bilateral - Description - Normal. Axillary  General Axillary Region: Bilateral - Description - Normal. Tenderness - Non Tender. Femoral & Inguinal  Generalized Femoral & Inguinal Lymphatics: Bilateral - Description - Normal. Tenderness - Non Tender.    Assessment & Plan   MALIGNANT NEOPLASM OF UPPER-INNER QUADRANT OF LEFT BREAST IN FEMALE, ESTROGEN RECEPTOR POSITIVE (C50.212) Impression: The patient appears to have a stage I cancer in the UIQ of the left breast. I have talked to her about the different options for treatment and at this point she favors breast conservation. She is also a good candidate for sentinel node mapping. I have discussed with her in detail the risks and benefits of the surgery as well as some of the technical aspects including the use of seed for localization and she understands and wishes to proceed. she will see med and rad onc for adjuvant therapy  Current Plans Referred to Oncology, for evaluation and follow up (Oncology). Routine.

## 2018-08-04 NOTE — Discharge Instructions (Signed)

## 2018-08-04 NOTE — Anesthesia Postprocedure Evaluation (Signed)
Anesthesia Post Note  Patient: Caitlin Graham  Procedure(s) Performed: LEFT BREAST LUMPECTOMY WITH RADIOACTIVE SEED AND SENTINEL LYMPH NODE BIOPSY (Left Breast)     Patient location during evaluation: PACU Anesthesia Type: General Level of consciousness: awake and alert Pain management: pain level controlled Vital Signs Assessment: post-procedure vital signs reviewed and stable Respiratory status: spontaneous breathing, nonlabored ventilation, respiratory function stable and patient connected to nasal cannula oxygen Cardiovascular status: blood pressure returned to baseline and stable Postop Assessment: no apparent nausea or vomiting Anesthetic complications: no    Last Vitals:  Vitals:   08/04/18 1534 08/04/18 1545  BP: 110/71 112/64  Pulse: 77 73  Resp: 19 (!) 21  Temp:    SpO2: 99% 99%    Last Pain:  Vitals:   08/04/18 1545  TempSrc:   PainSc: 0-No pain                 JOSLIN,DAVID COKER

## 2018-08-04 NOTE — Interval H&P Note (Signed)
History and Physical Interval Note:  08/04/2018 11:55 AM  Caitlin Graham  has presented today for surgery, with the diagnosis of LEFT BREAST CANCER.  The various methods of treatment have been discussed with the patient and family. After consideration of risks, benefits and other options for treatment, the patient has consented to  Procedure(s): LEFT BREAST LUMPECTOMY WITH RADIOACTIVE SEED AND SENTINEL LYMPH NODE BIOPSY (Left) as a surgical intervention.  The patient's history has been reviewed, patient examined, no change in status, stable for surgery.  I have reviewed the patient's chart and labs.  Questions were answered to the patient's satisfaction.     Autumn Messing III

## 2018-08-04 NOTE — Anesthesia Procedure Notes (Signed)
Procedure Name: Intubation Performed by: Verita Lamb, CRNA Pre-anesthesia Checklist: Patient identified, Emergency Drugs available, Suction available, Timeout performed and Patient being monitored Patient Re-evaluated:Patient Re-evaluated prior to induction Oxygen Delivery Method: Circle system utilized Preoxygenation: Pre-oxygenation with 100% oxygen Induction Type: IV induction Ventilation: Mask ventilation without difficulty Laryngoscope Size: Mac and 4 Grade View: Grade I Tube type: Oral Tube size: 7.0 mm Airway Equipment and Method: Stylet Placement Confirmation: ETT inserted through vocal cords under direct vision,  positive ETCO2,  CO2 detector and breath sounds checked- equal and bilateral Secured at: 21 cm Tube secured with: Tape Dental Injury: Teeth and Oropharynx as per pre-operative assessment

## 2018-08-04 NOTE — Anesthesia Procedure Notes (Signed)
Anesthesia Regional Block: Pectoralis block   Pre-Anesthetic Checklist: ,, timeout performed, Correct Patient, Correct Site, Correct Laterality, Correct Procedure, Correct Position, site marked, Risks and benefits discussed,  Surgical consent,  Pre-op evaluation,  At surgeon's request and post-op pain management  Laterality: Left  Prep: chloraprep       Needles:  Injection technique: Single-shot  Needle Type: Echogenic Needle     Needle Length: 10cm  Needle Gauge: 21     Additional Needles:   Narrative:  Start time: 08/04/2018 12:13 PM End time: 08/04/2018 12:17 PM Injection made incrementally with aspirations every 5 mL.  Performed by: Personally  Anesthesiologist: Audry Pili, MD  Additional Notes: No pain on injection. No increased resistance to injection. Injection made in 5cc increments. Good needle visualization. Patient tolerated the procedure well.

## 2018-08-05 ENCOUNTER — Encounter (HOSPITAL_BASED_OUTPATIENT_CLINIC_OR_DEPARTMENT_OTHER): Payer: Self-pay | Admitting: General Surgery

## 2018-08-09 ENCOUNTER — Telehealth: Payer: Self-pay | Admitting: *Deleted

## 2018-08-09 NOTE — Assessment & Plan Note (Addendum)
07/05/2018:Screening mammogram detected bilateral breast masses. Diagnostic mammogram and US showed cysts in the right breast and a 1.2cm left breast mass at the 9 o'clock position. 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  Stage Ia Brief course of neoadjuvant tamoxifen therapy started 07/07/2018 08/04/2018: Left lumpectomy:Left lumpectomy [name]): IDC with intermediate grade DCIS, 1.2cm, grade 1, clear margins, with 3 axillary lymph nodes negative for carcinoma ER 100%, PR 60%, Ki-67 50%  Pathology counseling: I discussed the final pathology report of the patient provided  a copy of this report. I discussed the margins as well as lymph node surgeries. We also discussed the final staging along with previously performed ER/PR and HER-2/neu testing.  Treatment plan: 1. Oncotype DX testing to determine if chemotherapy would be of any benefit followed by 2. Adjuvant radiation therapy followed by 3. Adjuvant antiestrogen therapy  Return to clinic based upon Oncotype DX test result

## 2018-08-09 NOTE — Telephone Encounter (Signed)
Ordered oncotype per Dr. Lindi Adie. Faxed requisition to Pathology.

## 2018-08-12 NOTE — Progress Notes (Signed)
Patient Care Team: Patient, [name] as PCP - General (General Practice) [name], RN as Oncology Nurse Navigator [name], RN as Oncology Nurse Navigator [name], MD as Consulting Physician (Hematology and Oncology) [name], MD as Consulting Physician (General Surgery) [name], MD as Attending Physician (Radiation Oncology)  DIAGNOSIS:    ICD-10-CM   1. Malignant neoplasm of upper-inner quadrant of left breast in female, estrogen receptor positive ([place])  C50.212    Z17.0     SUMMARY OF ONCOLOGIC HISTORY: Oncology History  Malignant neoplasm of upper-inner quadrant of left breast in female, estrogen receptor positive ([place])  07/05/2018 Initial Diagnosis   Screening mammogram detected bilateral breast masses. Diagnostic mammogram and US showed cysts in the right breast and a 1.2cm left breast mass at the 9 o'clock position. 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    07/07/2018 Cancer Staging   Staging form: Breast, AJCC 8th Edition - Clinical stage from 07/07/2018: Stage IA (cT1c, cN0, cM0, G2, ER+, PR+, HER2-) - Signed by [name], MD on 07/07/2018    Genetic Testing   Negative testing. No pathogenic variants identified on the [facility] Common Hereditary Cancers Panel. The Common Hereditary Cancers Panel offered by [facility] includes sequencing and/or deletion duplication testing of the following 47 genes: APC, ATM, AXIN2, BARD1, BMPR1A, BRCA1, BRCA2, BRIP1, CDH1, CDKN2A (p14ARF), CDKN2A (p16INK4a), CKD4, CHEK2, CTNNA1, DICER1, EPCAM (Deletion/duplication testing only), GREM1 (promoter region deletion/duplication testing only), KIT, MEN1, MLH1, MSH2, MSH3, MSH6, MUTYH, NBN, NF1, NHTL1, PALB2, PDGFRA, PMS2, POLD1, POLE, PTEN, RAD50, RAD51C, RAD51D, SDHB, SDHC, SDHD, SMAD4, SMARCA4. STK11, TP53, TSC1, TSC2, and VHL.  The following genes were evaluated for sequence changes only: SDHA and HOXB13 c.251G>A variant only. The  report date is 07/18/2018.   08/04/2018 Surgery   Left lumpectomy [name]): IDC with intermediate grade DCIS, 1.2cm, grade 1, clear margins, with 3 axillary lymph nodes negative for carcinoma     CHIEF COMPLIANT: Follow-p s/p lumpectomy to review pathology   INTERVAL HISTORY: Shaneta Cervenka is a 53 y.o. with above-mentioned history of left breast cancer. Genetic testing was negative. She underwent a left lumpectomy on 08/04/18 with Dr. [name] for which pathology showed 1.2cm grade 1 invasive ductal carcinoma with intermediate grade DCIS, clear margins, with 3 axillary lymph nodes negative for carcinoma. She presents to the clinic today to review the pathology report.  Her major complaint is numbness underneath the left arm  REVIEW OF SYSTEMS:   Constitutional: Denies fevers, chills or abnormal weight loss Eyes: Denies blurriness of vision Ears, nose, mouth, throat, and face: Denies mucositis or sore throat Respiratory: Denies cough, dyspnea or wheezes Cardiovascular: Denies palpitation, chest discomfort Gastrointestinal: Denies nausea, heartburn or change in bowel habits Skin: Denies abnormal skin rashes Lymphatics: Denies new lymphadenopathy or easy bruising Neurological: Denies numbness, tingling or new weaknesses Behavioral/Psych: Mood is stable, no new changes  Extremities: No lower extremity edema Breast: Recent left lumpectomy All other systems were reviewed with the patient and are negative.  I have reviewed the past medical history, past surgical history, social history and family history with the patient and they are unchanged from previous note.  ALLERGIES:  is allergic to aspirin; ibuprofen; and phenergan [promethazine].  MEDICATIONS:  Current Outpatient Medications  Medication Sig Dispense Refill   acetaminophen (TYLENOL) 325 MG tablet Take 650 mg by mouth every 6 (six) hours as needed.     HYDROcodone-acetaminophen (NORCO/VICODIN) 5-325 MG tablet Take  1-2 tablets by mouth  every 6 (six) hours as needed for moderate pain or severe pain. 15 tablet 0   tamoxifen (NOLVADEX) 20 MG tablet Take 1 tablet (20 mg total) by mouth daily. 30 tablet 0   No current facility-administered medications for this visit.     PHYSICAL EXAMINATION: ECOG PERFORMANCE STATUS: 1 - Symptomatic but completely ambulatory  Vitals:   08/13/18 1039  BP: 129/89  Pulse: 73  Resp: 18  Temp: 98.3 F (36.8 C)  SpO2: 100%   Filed Weights   08/13/18 1039  Weight: 280 lb 7 oz (127.2 kg)    GENERAL: alert, no distress and comfortable SKIN: skin color, texture, turgor are normal, no rashes or significant lesions EYES: normal, Conjunctiva are pink and non-injected, sclera clear OROPHARYNX: no exudate, no erythema and lips, buccal mucosa, and tongue normal  NECK: supple, thyroid normal size, non-tender, without nodularity LYMPH: no palpable lymphadenopathy in the cervical, axillary or inguinal LUNGS: clear to auscultation and percussion with normal breathing effort HEART: regular rate & rhythm and no murmurs and no lower extremity edema ABDOMEN: abdomen soft, non-tender and normal bowel sounds MUSCULOSKELETAL: no cyanosis of digits and no clubbing  NEURO: alert & oriented x 3 with fluent speech, no focal motor/sensory deficits EXTREMITIES: No lower extremity edema  LABORATORY DATA:  I have reviewed the data as listed CMP Latest Ref Rng & Units 07/07/2018  Glucose 70 - 99 mg/dL 126(H)  BUN 6 - 20 mg/dL 12  Creatinine 0.44 - 1.00 mg/dL 0.82  Sodium 135 - 145 mmol/L 138  Potassium 3.5 - 5.1 mmol/L 4.0  Chloride 98 - 111 mmol/L 107  CO2 22 - 32 mmol/L 23  Calcium 8.9 - 10.3 mg/dL 8.7(L)  Total Protein 6.5 - 8.1 g/dL 7.1  Total Bilirubin 0.3 - 1.2 mg/dL 0.4  Alkaline Phos 38 - 126 U/L 83  AST 15 - 41 U/L 9(L)  ALT 0 - 44 U/L 9    Lab Results  Component Value Date   WBC 10.1 07/07/2018   HGB 12.1 07/07/2018   HCT 38.7 07/07/2018   MCV 90.6 07/07/2018   PLT 294 07/07/2018    NEUTROABS 6.3 07/07/2018    ASSESSMENT & PLAN:  Malignant neoplasm of upper-inner quadrant of left breast in female, estrogen receptor positive ([place]) 07/05/2018:Screening mammogram detected bilateral breast masses. Diagnostic mammogram and US showed cysts in the right breast and a 1.2cm left breast mass at the 9 o'clock position. 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  Stage Ia Brief course of neoadjuvant tamoxifen therapy started 07/07/2018 08/04/2018: Left lumpectomy:Left lumpectomy [name]): IDC with intermediate grade DCIS, 1.2cm, grade 1, clear margins, with 3 axillary lymph nodes negative for carcinoma ER 100%, PR 60%, Ki-67 50%  Pathology counseling: I discussed the final pathology report of the patient provided  a copy of this report. I discussed the margins as well as lymph node surgeries. We also discussed the final staging along with previously performed ER/PR and HER-2/neu testing.  Treatment plan: 1. Oncotype DX testing to determine if chemotherapy would be of any benefit followed by 2. Adjuvant radiation therapy followed by 3. 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  She will base this on the Oncotype test result.  Return to clinic based upon Oncotype DX test result    No orders of the defined types were  placed in this encounter.  The patient has a good understanding of the overall plan. she agrees with it. she will call with any problems that may develop before the next visit here.  [name], MD 08/13/2018  Julious Oka Dorshimer am acting as scribe for Dr. [name].  I have reviewed the above documentation for accuracy and completeness, and I agree with the above.

## 2018-08-13 ENCOUNTER — Other Ambulatory Visit: Payer: Self-pay

## 2018-08-13 ENCOUNTER — Inpatient Hospital Stay: Payer: BC Managed Care – PPO | Attending: Hematology and Oncology | Admitting: Hematology and Oncology

## 2018-08-13 DIAGNOSIS — Z170 Estrogen receptor positive status [ER+]: Secondary | ICD-10-CM | POA: Insufficient documentation

## 2018-08-13 DIAGNOSIS — C50212 Malignant neoplasm of upper-inner quadrant of left female breast: Secondary | ICD-10-CM | POA: Insufficient documentation

## 2018-08-18 ENCOUNTER — Encounter (HOSPITAL_COMMUNITY): Payer: Self-pay | Admitting: Hematology and Oncology

## 2018-08-23 ENCOUNTER — Telehealth: Payer: Self-pay | Admitting: *Deleted

## 2018-08-23 NOTE — Telephone Encounter (Signed)
Received call back from patient.  Confirmed appointment with Dr. Lindi Adie for 8/18 at 11:15am to discuss oncotype results.

## 2018-08-23 NOTE — Telephone Encounter (Signed)
Received oncotype results of 32/20%.  Left message for a return phone call to have her come in to discuss with Dr. Lindi Adie.

## 2018-08-24 ENCOUNTER — Inpatient Hospital Stay: Payer: BC Managed Care – PPO | Admitting: Hematology and Oncology

## 2018-08-24 ENCOUNTER — Other Ambulatory Visit: Payer: Self-pay

## 2018-08-24 ENCOUNTER — Encounter: Payer: Self-pay | Admitting: Medical Oncology

## 2018-08-24 DIAGNOSIS — C50212 Malignant neoplasm of upper-inner quadrant of left female breast: Secondary | ICD-10-CM

## 2018-08-24 DIAGNOSIS — Z170 Estrogen receptor positive status [ER+]: Secondary | ICD-10-CM

## 2018-08-24 NOTE — Progress Notes (Signed)
Patient Care Team: Patient, [name] as PCP - General (General Practice) [name], RN as Oncology Nurse Navigator [name], RN as Oncology Nurse Navigator [name], MD as Consulting Physician (Hematology and Oncology) [name], MD as Consulting Physician (General Surgery) [name], MD as Attending Physician (Radiation Oncology)  DIAGNOSIS:  Encounter Diagnosis  Name Primary?  . Malignant neoplasm of upper-inner quadrant of left breast in female, estrogen receptor positive ([place])     SUMMARY OF ONCOLOGIC HISTORY: Oncology History  Malignant neoplasm of upper-inner quadrant of left breast in female, estrogen receptor positive ([place])  07/05/2018 Initial Diagnosis   Screening mammogram detected bilateral breast masses. Diagnostic mammogram and US showed cysts in the right breast and a 1.2cm left breast mass at the 9 o'clock position. 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    07/07/2018 Cancer Staging   Staging form: Breast, AJCC 8th Edition - Clinical stage from 07/07/2018: Stage IA (cT1c, cN0, cM0, G2, ER+, PR+, HER2-) - Signed by [name], MD on 07/07/2018    Genetic Testing   Negative testing. No pathogenic variants identified on the [facility] Common Hereditary Cancers Panel. The Common Hereditary Cancers Panel offered by [facility] includes sequencing and/or deletion duplication testing of the following 47 genes: APC, ATM, AXIN2, BARD1, BMPR1A, BRCA1, BRCA2, BRIP1, CDH1, CDKN2A (p14ARF), CDKN2A (p16INK4a), CKD4, CHEK2, CTNNA1, DICER1, EPCAM (Deletion/duplication testing only), GREM1 (promoter region deletion/duplication testing only), KIT, MEN1, MLH1, MSH2, MSH3, MSH6, MUTYH, NBN, NF1, NHTL1, PALB2, PDGFRA, PMS2, POLD1, POLE, PTEN, RAD50, RAD51C, RAD51D, SDHB, SDHC, SDHD, SMAD4, SMARCA4. STK11, TP53, TSC1, TSC2, and VHL.  The following genes were evaluated for sequence changes only: SDHA and HOXB13 c.251G>A variant only.  The report date is 07/18/2018.   08/04/2018 Surgery   Left lumpectomy [name]): IDC with intermediate grade DCIS, 1.2cm, grade 1, clear margins, with 3 axillary lymph nodes negative for carcinoma ER 100%, PR 60%, Ki-67 50%     CHIEF COMPLIANT: Follow-up to discuss results of Oncotype DX  INTERVAL HISTORY: Caitlin Graham is a 53 year old with above-mentioned history of left breast cancer who underwent lumpectomy and Oncotype DX testing.  She is here today to discuss the results of the test.  She is healed and recovered very well from the recent surgery.  She had fluid drained from her breast yesterday and was informed by surgery that her Oncotype came back as high risk.  REVIEW OF SYSTEMS:   Constitutional: Denies fevers, chills or abnormal weight loss Eyes: Denies blurriness of vision Ears, nose, mouth, throat, and face: Denies mucositis or sore throat Respiratory: Denies cough, dyspnea or wheezes Cardiovascular: Denies palpitation, chest discomfort Gastrointestinal:  Denies nausea, heartburn or change in bowel habits Skin: Denies abnormal skin rashes Lymphatics: Denies new lymphadenopathy or easy bruising Neurological:Denies numbness, tingling or new weaknesses Behavioral/Psych: Mood is stable, no new changes  Extremities: No lower extremity edema Breast: Healed and recovered from left lumpectomy All other systems were reviewed with the patient and are negative.  I have reviewed the past medical history, past surgical history, social history and family history with the patient and they are unchanged from previous note.  ALLERGIES:  is allergic to aspirin; ibuprofen; and phenergan [promethazine].  MEDICATIONS:  Current Outpatient Medications  Medication Sig Dispense Refill  . acetaminophen (TYLENOL) 325 MG tablet Take 650 mg by mouth every 6 (six) hours as needed.    . tamoxifen (NOLVADEX) 20 MG tablet Take 1 tablet (20 mg  total) by mouth daily. 30 tablet 0   No current  facility-administered medications for this visit.     PHYSICAL EXAMINATION: ECOG PERFORMANCE STATUS: 1 - Symptomatic but completely ambulatory  Vitals:   08/24/18 1124  BP: 109/65  Pulse: 84  Resp: 16  Temp: 98.5 F (36.9 C)  SpO2: 99%   Filed Weights   08/24/18 1124  Weight: 285 lb (129.3 kg)    GENERAL:alert, no distress and comfortable SKIN: skin color, texture, turgor are normal, no rashes or significant lesions EYES: normal, Conjunctiva are pink and non-injected, sclera clear OROPHARYNX:no exudate, no erythema and lips, buccal mucosa, and tongue normal  NECK: supple, thyroid normal size, non-tender, without nodularity LYMPH:  no palpable lymphadenopathy in the cervical, axillary or inguinal LUNGS: clear to auscultation and percussion with normal breathing effort HEART: regular rate & rhythm and no murmurs and no lower extremity edema ABDOMEN:abdomen soft, non-tender and normal bowel sounds MUSCULOSKELETAL:no cyanosis of digits and no clubbing  NEURO: alert & oriented x 3 with fluent speech, no focal motor/sensory deficits EXTREMITIES: No lower extremity edema   LABORATORY DATA:  I have reviewed the data as listed CMP Latest Ref Rng & Units 07/07/2018  Glucose 70 - 99 mg/dL 126(H)  BUN 6 - 20 mg/dL 12  Creatinine 0.44 - 1.00 mg/dL 0.82  Sodium 135 - 145 mmol/L 138  Potassium 3.5 - 5.1 mmol/L 4.0  Chloride 98 - 111 mmol/L 107  CO2 22 - 32 mmol/L 23  Calcium 8.9 - 10.3 mg/dL 8.7(L)  Total Protein 6.5 - 8.1 g/dL 7.1  Total Bilirubin 0.3 - 1.2 mg/dL 0.4  Alkaline Phos 38 - 126 U/L 83  AST 15 - 41 U/L 9(L)  ALT 0 - 44 U/L 9    Lab Results  Component Value Date   WBC 10.1 07/07/2018   HGB 12.1 07/07/2018   HCT 38.7 07/07/2018   MCV 90.6 07/07/2018   PLT 294 07/07/2018   NEUTROABS 6.3 07/07/2018    ASSESSMENT & PLAN:  Malignant neoplasm of upper-inner quadrant of left breast in female, estrogen receptor positive ([place]) receptor positive ([place])  07/05/2018:Screening mammogram detected bilateral breast masses. Diagnostic mammogram and US showed cysts in the right breast and a 1.2cm left breast mass at the 9 o'clock position. 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 Stage Ia Brief course of neoadjuvant tamoxifen therapy started 07/07/2018 08/04/2018: Left lumpectomy:Left lumpectomy [name]): IDC with intermediate grade DCIS, 1.2cm, grade 1, clear margins, with 3 axillary lymph nodes negative for carcinoma ER 100%, PR 60%, Ki-67 50%  Oncotype DX score: 32, distant recurrence at 9 years with hormone therapy alone: 20%  Treatment plan: 1.  Adjuvant chemotherapy with dose dense Adriamycin and Cytoxan x4 followed by Taxol x12 2.  Given radiation therapy followed by 3.  Adjuvant antiestrogen therapy ----------------------------------------------------------------------------------------------------------------------------- Chemotherapy Counseling: I discussed the risks and benefits of chemotherapy including the risks of nausea/ vomiting, risk of infection from low WBC count, fatigue due to chemo or anemia, bruising or bleeding due to low platelets, mouth sores, loss/ change in taste and decreased appetite. Liver and kidney function will be monitored through out chemotherapy as abnormalities in liver and kidney function may be a side effect of treatment. Cardiac dysfunction due to Adriamycin was discussed in detail. Risk of permanent bone marrow dysfunction and leukemia due to chemo were also discussed.  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   Plan: 1.  Port placement 2.  Echocardiogram 3.  Chemo class 4. UPBEAT clinical trial ([id number]): Newly diagnosed stage I to III breast cancer patients receiving either adjuvant or neoadjuvant chemotherapy undergo cardiac MRI before treatment and at 24 months along with neurocognitive testing, exercise  and disability measures at baseline 3, 12 and 24 months.  Patient is reluctant to consider chemotherapy.  However she will think about it after discussing with her husband. She is very worried about her ability to go to work.  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  If she is willing to receive chemo then we will plan to start chemo in 2 weeks.       No orders of the defined types were placed in this encounter.  The patient has a good understanding of the overall plan. she agrees with it. she will call with any problems that may develop before the next visit here.   Harriette Ohara, MD 08/24/18

## 2018-08-24 NOTE — Assessment & Plan Note (Signed)
receptor positive ([place]) 07/05/2018:Screening mammogram detected bilateral breast masses. Diagnostic mammogram and US showed cysts in the right breast and a 1.2cm left breast mass at the 9 o'clock position. 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 Stage Ia Brief course of neoadjuvant tamoxifen therapy started 07/07/2018 08/04/2018: Left lumpectomy:Left lumpectomy [name]): IDC with intermediate grade DCIS, 1.2cm, grade 1, clear margins, with 3 axillary lymph nodes negative for carcinoma ER 100%, PR 60%, Ki-67 50%  Oncotype DX score: 32, distant recurrence at 9 years with hormone therapy alone: 20%  Treatment plan: 1.  Adjuvant chemotherapy with dose dense Adriamycin and Cytoxan x4 followed by Taxol x12 2.  Given radiation therapy followed by 3.  Adjuvant antiestrogen therapy ----------------------------------------------------------------------------------------------------------------------------- Chemotherapy Counseling: I discussed the risks and benefits of chemotherapy including the risks of nausea/ vomiting, risk of infection from low WBC count, fatigue due to chemo or anemia, bruising or bleeding due to low platelets, mouth sores, loss/ change in taste and decreased appetite. Liver and kidney function will be monitored through out chemotherapy as abnormalities in liver and kidney function may be a side effect of treatment. Cardiac dysfunction due to Adriamycin was discussed in detail. Risk of permanent bone marrow dysfunction and leukemia due to chemo were also discussed.  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   Plan: 1.  Port placement 2.  Echocardiogram 3.  Chemo class 4. UPBEAT clinical trial ([id number]): Newly diagnosed stage I to III breast cancer patients receiving either adjuvant or neoadjuvant chemotherapy undergo cardiac MRI before treatment and at 24 months along with  neurocognitive testing, exercise and disability measures at baseline 3, 12 and 24 months.  Return to clinic in 2 weeks to start chemo.

## 2018-08-24 NOTE — Research (Signed)
UPBEAT: Referral Dr. Lindi Adie referred patient to study.  I met with patient, who is here alone, after her scheduled appointment with Dr. Lindi Adie. Patient confirms MD gave her a brief explanation of study and she has expressed interest in knowing more. I spoke with patient about study and provided her with the study consent and authorization, for her review, to take home. I also gave patient a brief explanation of what the study is about, the assessments, including the cardiac MRI, physical function and neurocognitive's, as well as questionnaires. I provided patient with information on when study visits occur would be expected to occur. Patient understands that study is voluntary and she is aware that study participation is pending her receiving chemotherapy, as well as meeting eligibility requirements. I confirmed with patient that she does not have symptomatic claustrophobia, patient states she does not think she does. Patient also confirms that she is able to hold her breath for 10 seconds, able to walk at least 2 blocks without chest pain, dyspnea, shortness of breath or fainting, able to exercise on a treadmill or stationary cycle and confirms she has no metal implants that may be non-compatible with an MRI.  At today's visit with patient, she was provided with the study consent, for her review, a Clinical Trials Information Pamphlet and my contact information. Patient was thanked for her time and encouraged to call with questions.  Maxwell Marion, RN, BSN, [facility] Clinical Research 08/24/2018 1:59 PM

## 2018-08-25 ENCOUNTER — Other Ambulatory Visit: Payer: Self-pay

## 2018-08-25 ENCOUNTER — Other Ambulatory Visit: Payer: Self-pay | Admitting: Hematology and Oncology

## 2018-08-25 ENCOUNTER — Telehealth: Payer: Self-pay | Admitting: Hematology and Oncology

## 2018-08-25 DIAGNOSIS — C50212 Malignant neoplasm of upper-inner quadrant of left female breast: Secondary | ICD-10-CM

## 2018-08-25 DIAGNOSIS — Z170 Estrogen receptor positive status [ER+]: Secondary | ICD-10-CM

## 2018-08-25 MED ORDER — LIDOCAINE-PRILOCAINE 2.5-2.5 % EX CREA: TOPICAL_CREAM | CUTANEOUS | 3 refills | Status: DC

## 2018-08-25 MED ORDER — DEXAMETHASONE 4 MG PO TABS: ORAL_TABLET | ORAL | 0 refills | Status: DC

## 2018-08-25 MED ORDER — ONDANSETRON HCL 8 MG PO TABS: 8.0000 mg | ORAL_TABLET | Freq: Two times a day (BID) | ORAL | 1 refills | Status: DC | PRN

## 2018-08-25 MED ORDER — PROCHLORPERAZINE MALEATE 10 MG PO TABS: 10.0000 mg | ORAL_TABLET | Freq: Four times a day (QID) | ORAL | 1 refills | Status: DC | PRN

## 2018-08-25 MED ORDER — LORAZEPAM 0.5 MG PO TABS: 0.5000 mg | ORAL_TABLET | Freq: Every evening | ORAL | 0 refills | Status: DC | PRN

## 2018-08-25 NOTE — Telephone Encounter (Signed)
I talk with patient regarding schedule  

## 2018-08-25 NOTE — Progress Notes (Signed)
START ON PATHWAY REGIMEN - Breast   Dose-Dense AC q14 days:   A cycle is every 14 days:     Doxorubicin      Cyclophosphamide      Pegfilgrastim-xxxx   **Always confirm dose/schedule in your pharmacy ordering system**  Paclitaxel 80 mg/m2 Weekly:   Administer weekly:     Paclitaxel   **Always confirm dose/schedule in your pharmacy ordering system**  Patient Characteristics: Postoperative without Neoadjuvant Therapy (Pathologic Staging), Invasive Disease, Adjuvant Therapy, HER2 Negative/Unknown/Equivocal, ER Positive, Node Negative, pT1a-c, pN0/N35m or pT2 or Higher, pN0, Oncotype High Risk (? 26) Therapeutic Status: Postoperative without Neoadjuvant Therapy (Pathologic Staging) AJCC Grade: G1 AJCC N Category: pN0 AJCC M Category: cM0 ER Status: Positive (+) AJCC 8 Stage Grouping: IA HER2 Status: Negative (-) Oncotype Dx Recurrence Score: 32 AJCC T Category: pT1c PR Status: Positive (+) Has this patient completed genomic testing<= Yes - Oncotype DX(R) Intent of Therapy: Curative Intent, Discussed with Patient

## 2018-08-25 NOTE — Progress Notes (Signed)
RN returned call to patient. Patient voiced that she would like to proceed with chemotherapy.    RN will initiate scheduling chemotherapy, education, port placement and echocardiogram.  Patient voiced understanding and agreement.    Scheduling message sent, and orders placed.

## 2018-08-26 ENCOUNTER — Ambulatory Visit: Payer: Self-pay | Admitting: General Surgery

## 2018-08-30 ENCOUNTER — Other Ambulatory Visit: Payer: Self-pay

## 2018-08-30 ENCOUNTER — Ambulatory Visit: Payer: BC Managed Care – PPO | Attending: General Surgery | Admitting: Physical Therapy

## 2018-08-30 ENCOUNTER — Encounter (HOSPITAL_BASED_OUTPATIENT_CLINIC_OR_DEPARTMENT_OTHER): Payer: Self-pay | Admitting: *Deleted

## 2018-08-30 ENCOUNTER — Encounter: Payer: Self-pay | Admitting: Physical Therapy

## 2018-08-30 DIAGNOSIS — M25612 Stiffness of left shoulder, not elsewhere classified: Secondary | ICD-10-CM | POA: Insufficient documentation

## 2018-08-30 DIAGNOSIS — Z483 Aftercare following surgery for neoplasm: Secondary | ICD-10-CM

## 2018-08-30 DIAGNOSIS — Z170 Estrogen receptor positive status [ER+]: Secondary | ICD-10-CM | POA: Insufficient documentation

## 2018-08-30 DIAGNOSIS — R293 Abnormal posture: Secondary | ICD-10-CM | POA: Insufficient documentation

## 2018-08-30 DIAGNOSIS — C50212 Malignant neoplasm of upper-inner quadrant of left female breast: Secondary | ICD-10-CM | POA: Diagnosis present

## 2018-08-30 NOTE — Therapy (Signed)
[place], [place], [address] Phone: [phone]   Fax:  [phone]  Physical Therapy Treatment  Patient Details  Name: Caitlin Graham MRN: [id number] Date of Birth: 1965/12/26 Referring Provider (PT): Dr. Autumn Messing   Encounter Date: 08/30/2018  PT End of Session - 08/30/18 1346    Visit Number  2    Number of Visits  2    PT Start Time  1250    PT Stop Time  1350    PT Time Calculation (min)  60 min    Activity Tolerance  Patient tolerated treatment well    Behavior During Therapy  [facility] for tasks assessed/performed       Past Medical History:  Diagnosis Date  . BMI 45.0-49.9, adult ([place])   . Depression   . Family history of ovarian cancer   . Family history of stomach cancer   . Sleep apnea    resolved with gastic bypass    Past Surgical History:  Procedure Laterality Date  . BREAST LUMPECTOMY WITH RADIOACTIVE SEED AND SENTINEL LYMPH NODE BIOPSY Left 08/04/2018   Procedure: LEFT BREAST LUMPECTOMY WITH RADIOACTIVE SEED AND SENTINEL LYMPH NODE BIOPSY;  Surgeon: [name], MD;  Location: [place];  Service: General;  Laterality: Left;  . gastric bypass    . ORIF ELBOW FRACTURE      There were no vitals filed for this visit.  Subjective Assessment - 08/30/18 1259    Subjective  Patient reports she underwent a left lumpectomy and sentinel node biopsy 08/04/2018. She had 3 axillary nodes removed and they were negative. She had a large seroma and fluid was drawn on 07/23/2018 by her surgeon. Her Oncotype score was high and she will undergo chemotherapy. She is very anxious about needing chemo. She will also undergo radiation and anti-estrogen therapy.    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 It is ER/PR positive and HER2 negative with a Ki67 of 50%. She has a plate in her left forearm from surgery in 2000  and had bariatric surgery 16 years ago. She underwent a left lumpectomy and sentinel node biopsy 08/04/2018. She had 3 axillary nodes removed and they were negative.    Patient Stated Goals  See how my arm is    Currently in Pain?  Yes    Pain Score  4     Pain Location  Axilla   And into left breast   Pain Orientation  Left    Pain Descriptors / Indicators  Numbness;Tingling;Tender    Pain Type  Surgical pain    Pain Onset  More than a month ago    Pain Frequency  Intermittent    Aggravating Factors   Using left arm    Pain Relieving Factors  Rest    Multiple Pain Sites  No         [facility] PT Assessment - 08/30/18 0001      Assessment   Medical Diagnosis  s/p left lumpectomy and SLNB    Referring Provider (PT)  Dr. Autumn Messing    Onset Date/Surgical Date  08/04/18    Hand Dominance  Right    Prior Therapy  Baselines      Precautions   Precautions  Other (comment)    Precaution Comments  recent surgery      Restrictions   Weight Bearing Restrictions  No  Balance Screen   Has the patient fallen in the past 6 months  No    Has the patient had a decrease in activity level because of a fear of falling?   No    Is the patient reluctant to leave their home because of a fear of falling?   No      Home Environment   Living Environment  Private residence    Living Arrangements  Spouse/significant other;Children   Husband and 47 y.o. son   Available Help at Discharge  Family      Prior Function   Level of Independence  Independent    Vocation  Full time employment    Vocation Requirements  Sign language interpreter at [employer]  She does not exercise      Cognition   Overall Cognitive Status  Within Functional Limits for tasks assessed      Observation/Other Assessments   Observations  Incisions appear to be healing well. Breast appears slightly red and edematous on the medial inferior side. Compression foam placed on medical breast and lateral breast  to reduce edema.      Posture/Postural Control   Posture/Postural Control  Postural limitations    Postural Limitations  Rounded Shoulders;Forward head      ROM / Strength   AROM / PROM / Strength  AROM      AROM   AROM Assessment Site  Shoulder    Right/Left Shoulder  Left    Left Shoulder Extension  42 Degrees    Left Shoulder Flexion  133 Degrees    Left Shoulder ABduction  129 Degrees    Left Shoulder Internal Rotation  69 Degrees    Left Shoulder External Rotation  90 Degrees        LYMPHEDEMA/ONCOLOGY QUESTIONNAIRE - 08/30/18 1313      Type   Cancer Type  Left breast cancer      Surgeries   Lumpectomy Date  08/04/18    Sentinel Lymph Node Biopsy Date  08/04/18    Number Lymph Nodes Removed  3      Treatment   Active Chemotherapy Treatment  No   Begins chemo 09/07/2018   Past Chemotherapy Treatment  No    Active Radiation Treatment  No    Past Radiation Treatment  No    Current Hormone Treatment  No    Past Hormone Therapy  No      What other symptoms do you have   Are you Having Heaviness or Tightness  No    Are you having Pain  Yes    Are you having pitting edema  No    Is it Hard or Difficult finding clothes that fit  No    Do you have infections  No    Is there Decreased scar mobility  No    Stemmer Sign  No      Lymphedema Assessments   Lymphedema Assessments  Upper extremities      Right Upper Extremity Lymphedema   10 cm Proximal to Olecranon Process  34.2 cm    Olecranon Process  27.7 cm    10 cm Proximal to Ulnar Styloid Process  25.6 cm    Just Proximal to Ulnar Styloid Process  17.4 cm    Across Hand at [employer]  19.5 cm    At [place] of 2nd Digit  5.8 cm      Left Upper Extremity Lymphedema   10 cm  Proximal to Olecranon Process  35.6 cm    Olecranon Process  27.2 cm    10 cm Proximal to Ulnar Styloid Process  25 cm    Just Proximal to Ulnar Styloid Process  17.1 cm    Across Hand at [employer]  18.6 cm    At [place] of 2nd Digit   5.6 cm        Quick Dash - 08/30/18 0001    Open a tight or new jar  Mild difficulty    Do heavy household chores (wash walls, wash floors)  Mild difficulty    Carry a shopping bag or briefcase  Mild difficulty    Wash your back  Mild difficulty    Use a knife to cut food  Mild difficulty    Recreational activities in which you take some force or impact through your arm, shoulder, or hand (golf, hammering, tennis)  Mild difficulty    During the past week, to what extent has your arm, shoulder or hand problem interfered with your normal social activities with family, friends, neighbors, or groups?  Modererately    During the past week, to what extent has your arm, shoulder or hand problem limited your work or other regular daily activities  Slightly    Arm, shoulder, or hand pain.  Mild    Tingling (pins and needles) in your arm, shoulder, or hand  Moderate    Difficulty Sleeping  Mild difficulty    DASH Score  29.55 %                     PT Education - 08/30/18 1342    Education Details  HEP and lymphedema education    Person(s) Educated  Patient    Methods  Explanation;Demonstration;Handout    Comprehension  Returned demonstration;Verbalized understanding          PT Long Term Goals - 08/30/18 1404      PT LONG TERM GOAL #1   Title  Patient will demonstrate she has regained shoulder ROM and function post operatively compared to baselines.    Time  8    Period  Weeks    Status  Partially Met            Plan - 08/30/18 1400    Clinical Impression Statement  Patient is doing well s/p left lumpectomy and SLNB but is struggling with pain and the fact that she needs to undergo chemotherapy. She was tearful during her session today and had many questions related to chemotherapy. She has chemo education class on 09/01/2018 so I encouraged her to write down all questions so those can be addessed. Her shoulder ROM and function is back to baseline but with  complaints of pain. Encouraged her to do some desensitization for her incision site as she reports hypersensitivity with light touch. She does not want to pursue PT at this time because she feels she can do the exercises at home but agrees to contact us if her breast pain and limitations do not improve.    PT Treatment/Interventions  ADLs/Self Care Home Management;Therapeutic exercise;Patient/family education    PT Next Visit Plan  D/C - pt to contact us if she wants to return    PT Home Exercise Plan  Post op shoulder ROM HEP    Consulted and Agree with Plan of Care  Patient       Patient will benefit from skilled therapeutic intervention in order to improve the following  deficits and impairments:  Pain, Impaired UE functional use, Decreased knowledge of precautions, Postural dysfunction, Decreased range of motion  Visit Diagnosis: Malignant neoplasm of upper-inner quadrant of left breast in female, estrogen receptor positive (HCC)  Abnormal posture  Aftercare following surgery for neoplasm  Stiffness of left shoulder, not elsewhere classified     Problem List Patient Active Problem List   Diagnosis Date Noted  . Genetic testing 07/19/2018  . Family history of ovarian cancer   . Family history of stomach cancer   . 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   Remaining deficits: Breast / axillary pain and left shoulder abduction ROM limitation   Education / Equipment: HEP and lymphedema education Plan: Patient agrees to discharge.  Patient goals were partially met. Patient is being discharged due to being pleased with the current functional level.  ?????        Annia Friendly,  [place] 08/30/18 2:05 PM  [place], [place], [address] Phone: [phone]   Fax:  [phone]  Name: Caitlin Graham MRN: [id number] Date of Birth: 04-Feb-1965

## 2018-09-01 ENCOUNTER — Other Ambulatory Visit: Payer: Self-pay

## 2018-09-01 ENCOUNTER — Ambulatory Visit (HOSPITAL_COMMUNITY)
Admission: RE | Admit: 2018-09-01 | Discharge: 2018-09-01 | Disposition: A | Discharge status: Home / Self Care | Payer: BC Managed Care – PPO | Source: Ambulatory Visit | Attending: Hematology and Oncology | Admitting: Hematology and Oncology

## 2018-09-01 ENCOUNTER — Encounter: Payer: Self-pay | Admitting: Hematology and Oncology

## 2018-09-01 ENCOUNTER — Inpatient Hospital Stay: Payer: BC Managed Care – PPO

## 2018-09-01 DIAGNOSIS — C50212 Malignant neoplasm of upper-inner quadrant of left female breast: Secondary | ICD-10-CM

## 2018-09-01 DIAGNOSIS — Z170 Estrogen receptor positive status [ER+]: Secondary | ICD-10-CM | POA: Insufficient documentation

## 2018-09-01 NOTE — Progress Notes (Signed)
  Echocardiogram 2D Echocardiogram has been performed.  Caitlin Graham 09/01/2018, 9:53 AM

## 2018-09-01 NOTE — Progress Notes (Signed)
Met with patient at registration to introduce myself as Arboriculturist and to offer available resources.  Discussed one-time $1000 Radio broadcast assistant.  Discussed ins ded/OOP. Patient thinks she has almost met. Advised there is copay assistance available for injection of needed.  Gave her my card for any additional financial questions or concerns and if interested in applying.  Gave her brochure on [employer] program to address her billing concerns.  She was very Patent attorney.

## 2018-09-02 ENCOUNTER — Other Ambulatory Visit (HOSPITAL_COMMUNITY)
Admission: RE | Admit: 2018-09-02 | Discharge: 2018-09-02 | Disposition: A | Discharge status: Home / Self Care | Payer: BC Managed Care – PPO | Source: Ambulatory Visit | Attending: General Surgery | Admitting: General Surgery

## 2018-09-02 DIAGNOSIS — Z01812 Encounter for preprocedural laboratory examination: Secondary | ICD-10-CM | POA: Diagnosis not present

## 2018-09-02 DIAGNOSIS — Z20828 Contact with and (suspected) exposure to other viral communicable diseases: Secondary | ICD-10-CM | POA: Insufficient documentation

## 2018-09-02 LAB — SARS CORONAVIRUS 2 (TAT 6-24 HRS): SARS Coronavirus 2: NEGATIVE

## 2018-09-02 NOTE — Progress Notes (Signed)
Anesthesia consult per Dr. Hollis, will proceed with surgery as scheduled.  

## 2018-09-02 NOTE — Progress Notes (Signed)

## 2018-09-06 ENCOUNTER — Ambulatory Visit (HOSPITAL_BASED_OUTPATIENT_CLINIC_OR_DEPARTMENT_OTHER): Payer: BC Managed Care – PPO | Admitting: Certified Registered"

## 2018-09-06 ENCOUNTER — Ambulatory Visit (HOSPITAL_COMMUNITY): Payer: BC Managed Care – PPO

## 2018-09-06 ENCOUNTER — Ambulatory Visit (HOSPITAL_BASED_OUTPATIENT_CLINIC_OR_DEPARTMENT_OTHER)
Admission: RE | Admit: 2018-09-06 | Discharge: 2018-09-06 | Disposition: A | Discharge status: Home / Self Care | Payer: BC Managed Care – PPO | Attending: General Surgery | Admitting: General Surgery

## 2018-09-06 ENCOUNTER — Encounter (HOSPITAL_BASED_OUTPATIENT_CLINIC_OR_DEPARTMENT_OTHER): Payer: Self-pay

## 2018-09-06 ENCOUNTER — Encounter (HOSPITAL_BASED_OUTPATIENT_CLINIC_OR_DEPARTMENT_OTHER)
Admission: RE | Disposition: A | Discharge status: Home / Self Care | Payer: Self-pay | Source: Home / Self Care | Attending: General Surgery

## 2018-09-06 ENCOUNTER — Other Ambulatory Visit: Payer: Self-pay

## 2018-09-06 DIAGNOSIS — Z7952 Long term (current) use of systemic steroids: Secondary | ICD-10-CM | POA: Insufficient documentation

## 2018-09-06 DIAGNOSIS — Z886 Allergy status to analgesic agent status: Secondary | ICD-10-CM | POA: Insufficient documentation

## 2018-09-06 DIAGNOSIS — Z6841 Body Mass Index (BMI) 40.0 and over, adult: Secondary | ICD-10-CM | POA: Diagnosis not present

## 2018-09-06 DIAGNOSIS — Z888 Allergy status to other drugs, medicaments and biological substances status: Secondary | ICD-10-CM | POA: Insufficient documentation

## 2018-09-06 DIAGNOSIS — G4730 Sleep apnea, unspecified: Secondary | ICD-10-CM | POA: Diagnosis not present

## 2018-09-06 DIAGNOSIS — Z885 Allergy status to narcotic agent status: Secondary | ICD-10-CM | POA: Diagnosis not present

## 2018-09-06 DIAGNOSIS — C50212 Malignant neoplasm of upper-inner quadrant of left female breast: Secondary | ICD-10-CM | POA: Insufficient documentation

## 2018-09-06 DIAGNOSIS — Z170 Estrogen receptor positive status [ER+]: Secondary | ICD-10-CM | POA: Insufficient documentation

## 2018-09-06 DIAGNOSIS — Z95828 Presence of other vascular implants and grafts: Secondary | ICD-10-CM

## 2018-09-06 HISTORY — PX: PORTACATH PLACEMENT: SHX2246

## 2018-09-06 LAB — POCT PREGNANCY, URINE: Preg Test, Ur: NEGATIVE

## 2018-09-06 SURGERY — INSERTION, TUNNELED CENTRAL VENOUS DEVICE, WITH PORT
Anesthesia: General | Site: Chest | Laterality: Right

## 2018-09-06 MED ORDER — FENTANYL CITRATE (PF) 100 MCG/2ML IJ SOLN
50.0000 ug | INTRAMUSCULAR | Status: DC | PRN
  Administered 2018-09-06: 12:00:00 100 ug via INTRAVENOUS

## 2018-09-06 MED ORDER — HEPARIN (PORCINE) IN NACL 2-0.9 UNITS/ML
INTRAMUSCULAR | Status: AC | PRN
  Administered 2018-09-06: 1 via INTRAVENOUS

## 2018-09-06 MED ORDER — DEXAMETHASONE SODIUM PHOSPHATE 4 MG/ML IJ SOLN
INTRAMUSCULAR | Status: DC | PRN
  Administered 2018-09-06: 4 mg via INTRAVENOUS

## 2018-09-06 MED ORDER — GABAPENTIN 300 MG PO CAPS
ORAL_CAPSULE | ORAL | Status: AC
  Filled 2018-09-06: qty 1

## 2018-09-06 MED ORDER — CEFAZOLIN SODIUM-DEXTROSE 2-4 GM/100ML-% IV SOLN
INTRAVENOUS | Status: AC
  Filled 2018-09-06: qty 100

## 2018-09-06 MED ORDER — CHLORHEXIDINE GLUCONATE CLOTH 2 % EX PADS: 6.0000 | MEDICATED_PAD | Freq: Once | CUTANEOUS | Status: DC

## 2018-09-06 MED ORDER — MIDAZOLAM HCL 2 MG/2ML IJ SOLN
INTRAMUSCULAR | Status: AC
  Filled 2018-09-06: qty 2

## 2018-09-06 MED ORDER — TRAMADOL HCL 50 MG PO TABS: 50.0000 mg | ORAL_TABLET | Freq: Four times a day (QID) | ORAL | 0 refills | Status: DC | PRN

## 2018-09-06 MED ORDER — CEFAZOLIN SODIUM-DEXTROSE 2-4 GM/100ML-% IV SOLN
2.0000 g | INTRAVENOUS | Status: AC
  Administered 2018-09-06: 12:00:00 3 g via INTRAVENOUS

## 2018-09-06 MED ORDER — HEPARIN SOD (PORK) LOCK FLUSH 100 UNIT/ML IV SOLN
INTRAVENOUS | Status: DC | PRN
  Administered 2018-09-06: 500 [IU] via INTRAVENOUS

## 2018-09-06 MED ORDER — LACTATED RINGERS IV SOLN
INTRAVENOUS | Status: DC
  Administered 2018-09-06 (×2): via INTRAVENOUS

## 2018-09-06 MED ORDER — HEPARIN (PORCINE) IN NACL 1000-0.9 UT/500ML-% IV SOLN
INTRAVENOUS | Status: AC
  Filled 2018-09-06: qty 500

## 2018-09-06 MED ORDER — ACETAMINOPHEN 500 MG PO TABS
ORAL_TABLET | ORAL | Status: AC
  Filled 2018-09-06: qty 2

## 2018-09-06 MED ORDER — BUPIVACAINE HCL (PF) 0.25 % IJ SOLN
INTRAMUSCULAR | Status: AC
  Filled 2018-09-06: qty 30

## 2018-09-06 MED ORDER — ACETAMINOPHEN 500 MG PO TABS
1000.0000 mg | ORAL_TABLET | ORAL | Status: AC
  Administered 2018-09-06: 1000 mg via ORAL

## 2018-09-06 MED ORDER — PROPOFOL 10 MG/ML IV BOLUS
INTRAVENOUS | Status: DC | PRN
  Administered 2018-09-06: 150 mg via INTRAVENOUS

## 2018-09-06 MED ORDER — FENTANYL CITRATE (PF) 100 MCG/2ML IJ SOLN
INTRAMUSCULAR | Status: AC
  Filled 2018-09-06: qty 2

## 2018-09-06 MED ORDER — MIDAZOLAM HCL 2 MG/2ML IJ SOLN
1.0000 mg | INTRAMUSCULAR | Status: DC | PRN
  Administered 2018-09-06: 2 mg via INTRAVENOUS

## 2018-09-06 MED ORDER — CEFAZOLIN SODIUM-DEXTROSE 1-4 GM/50ML-% IV SOLN
INTRAVENOUS | Status: AC
  Filled 2018-09-06: qty 50

## 2018-09-06 MED ORDER — GABAPENTIN 300 MG PO CAPS
300.0000 mg | ORAL_CAPSULE | ORAL | Status: AC
  Administered 2018-09-06: 300 mg via ORAL

## 2018-09-06 MED ORDER — ONDANSETRON HCL 4 MG/2ML IJ SOLN
INTRAMUSCULAR | Status: DC | PRN
  Administered 2018-09-06: 4 mg via INTRAVENOUS

## 2018-09-06 MED ORDER — LIDOCAINE HCL (CARDIAC) PF 100 MG/5ML IV SOSY
PREFILLED_SYRINGE | INTRAVENOUS | Status: DC | PRN
  Administered 2018-09-06: 60 mg via INTRAVENOUS

## 2018-09-06 MED ORDER — HEPARIN SOD (PORK) LOCK FLUSH 100 UNIT/ML IV SOLN
INTRAVENOUS | Status: AC
  Filled 2018-09-06: qty 5

## 2018-09-06 MED ORDER — BUPIVACAINE HCL (PF) 0.25 % IJ SOLN
INTRAMUSCULAR | Status: DC | PRN
  Administered 2018-09-06: 7 mL

## 2018-09-06 MED ORDER — FENTANYL CITRATE (PF) 100 MCG/2ML IJ SOLN
25.0000 ug | INTRAMUSCULAR | Status: DC | PRN
  Administered 2018-09-06 (×2): 50 ug via INTRAVENOUS

## 2018-09-06 SURGICAL SUPPLY — 52 items
BAG DECANTER FOR FLEXI CONT (MISCELLANEOUS) ×2 IMPLANT
BLADE SURG 15 STRL LF DISP TIS (BLADE) ×1 IMPLANT
BLADE SURG 15 STRL SS (BLADE) ×1
CANISTER SUCT 1200ML W/VALVE (MISCELLANEOUS) IMPLANT
CHLORAPREP W/TINT 26 (MISCELLANEOUS) ×2 IMPLANT
CLEANER CAUTERY TIP 5X5 PAD (MISCELLANEOUS) ×1 IMPLANT
COVER BACK TABLE REUSABLE LG (DRAPES) ×2 IMPLANT
COVER MAYO STAND REUSABLE (DRAPES) ×2 IMPLANT
COVER WAND RF STERILE (DRAPES) IMPLANT
DECANTER SPIKE VIAL GLASS SM (MISCELLANEOUS) IMPLANT
DERMABOND ADVANCED (GAUZE/BANDAGES/DRESSINGS) ×1
DERMABOND ADVANCED .7 DNX12 (GAUZE/BANDAGES/DRESSINGS) ×1 IMPLANT
DRAPE C-ARM 42X72 X-RAY (DRAPES) ×2 IMPLANT
DRAPE LAPAROSCOPIC ABDOMINAL (DRAPES) ×2 IMPLANT
DRAPE UTILITY XL STRL (DRAPES) ×2 IMPLANT
ELECT REM PT RETURN 9FT ADLT (ELECTROSURGICAL) ×2
ELECTRODE REM PT RTRN 9FT ADLT (ELECTROSURGICAL) ×1 IMPLANT
GLOVE BIO SURGEON STRL SZ7 (GLOVE) ×2 IMPLANT
GLOVE BIO SURGEON STRL SZ7.5 (GLOVE) ×2 IMPLANT
GLOVE BIOGEL PI IND STRL 7.0 (GLOVE) IMPLANT
GLOVE BIOGEL PI INDICATOR 7.0 (GLOVE) ×2
GLOVE EXAM NITRILE MD LF STRL (GLOVE) ×1 IMPLANT
GOWN STRL REUS W/ TWL LRG LVL3 (GOWN DISPOSABLE) ×2 IMPLANT
GOWN STRL REUS W/ TWL XL LVL3 (GOWN DISPOSABLE) IMPLANT
GOWN STRL REUS W/TWL LRG LVL3 (GOWN DISPOSABLE) ×1
GOWN STRL REUS W/TWL XL LVL3 (GOWN DISPOSABLE) ×2
IV KIT MINILOC 20X1 SAFETY (NEEDLE) IMPLANT
KIT PORT POWER 8FR ISP CVUE (Port) ×1 IMPLANT
NDL HYPO 25X1 1.5 SAFETY (NEEDLE) ×1 IMPLANT
NDL SAFETY ECLIPSE 18X1.5 (NEEDLE) IMPLANT
NDL SPNL 22GX3.5 QUINCKE BK (NEEDLE) IMPLANT
NEEDLE HYPO 18GX1.5 SHARP (NEEDLE) ×1
NEEDLE HYPO 22GX1.5 SAFETY (NEEDLE) ×1 IMPLANT
NEEDLE HYPO 25X1 1.5 SAFETY (NEEDLE) ×2 IMPLANT
NEEDLE SPNL 22GX3.5 QUINCKE BK (NEEDLE) IMPLANT
PACK BASIN DAY SURGERY FS (CUSTOM PROCEDURE TRAY) ×2 IMPLANT
PAD CLEANER CAUTERY TIP 5X5 (MISCELLANEOUS)
PENCIL BUTTON HOLSTER BLD 10FT (ELECTRODE) ×2 IMPLANT
SLEEVE SCD COMPRESS KNEE MED (MISCELLANEOUS) ×1 IMPLANT
SUT MON AB 4-0 PC3 18 (SUTURE) ×2 IMPLANT
SUT PROLENE 2 0 SH DA (SUTURE) ×2 IMPLANT
SUT SILK 2 0 TIES 17X18 (SUTURE)
SUT SILK 2-0 18XBRD TIE BLK (SUTURE) IMPLANT
SUT VIC AB 3-0 SH 27 (SUTURE) ×1
SUT VIC AB 3-0 SH 27X BRD (SUTURE) ×1 IMPLANT
SYR 10ML LL (SYRINGE) ×1 IMPLANT
SYR 30ML LL (SYRINGE) ×1 IMPLANT
SYR 5ML LL (SYRINGE) ×2 IMPLANT
SYR CONTROL 10ML LL (SYRINGE) ×4 IMPLANT
TOWEL GREEN STERILE FF (TOWEL DISPOSABLE) ×4 IMPLANT
TUBE CONNECTING 20X1/4 (TUBING) IMPLANT
YANKAUER SUCT BULB TIP NO VENT (SUCTIONS) IMPLANT

## 2018-09-06 NOTE — Anesthesia Postprocedure Evaluation (Signed)
Anesthesia Post Note  Patient: Caitlin Graham  Procedure(s) Performed: INSERTION PORT-A-CATHETER (Right Chest)     Patient location during evaluation: PACU Anesthesia Type: General Level of consciousness: awake and alert Pain management: pain level controlled Vital Signs Assessment: post-procedure vital signs reviewed and stable Respiratory status: spontaneous breathing, nonlabored ventilation, respiratory function stable and patient connected to nasal cannula oxygen Cardiovascular status: blood pressure returned to baseline and stable Postop Assessment: no apparent nausea or vomiting Anesthetic complications: no    Last Vitals:  Vitals:   09/06/18 1430 09/06/18 1442  BP: 111/62 115/62  Pulse: 66 (!) 57  Resp: 16 16  Temp:  36.5 C  SpO2: 96% 94%    Last Pain:  Vitals:   09/06/18 1442  TempSrc:   PainSc: 3                  Chelsey L Woodrum

## 2018-09-06 NOTE — H&P (Signed)
Caitlin Graham  Location: [facility] Patient #: [id number] DOB: [birth year] Married / Language: English / Race: White Female   History of Present Illness  The patient is a 53 year old female who presents for a follow-up for Breast cancer. The patient is a 53 year old white female who is about 2 weeks status post left breast lumpectomy and sentinel node biopsy for a T1 cN0 left breast cancer that was ER and PR positive and HER-2 negative with a Ki-67 of 50%. She tolerated the surgery well but did develop some swelling in the left axilla that was painful. She denies any fevers or chills. She did have an Oncotype sent in the recurrence score was 32.   Allergies Codeine/Codeine Derivatives  Abdominal pain. Aspirin *ANALGESICS - NonNarcotic*  Swelling. Norco *ANALGESICS - OPIOID*  Abdominal pain. Ibuprofen *ANALGESICS - ANTI-INFLAMMATORY*  Status Post gastric bypass Promethazine HCl *ANTIHISTAMINES*  Anxiety Allergies Reconciled   Medication History  traMADol HCl ('50MG'$  Tablet, 1 (one) Oral every six hours, as needed, Taken starting 08/05/2018) Active. Tylenol ('325MG'$  Tablet, Oral) Active. prednisoLONE ('5MG'$  Tablet, Oral) Active. Medications Reconciled  Vitals 08/23/2018 10:24 AM Weight: 285 lb Height: 65in Body Surface Area: 2.3 m Body Mass Index: 47.43 kg/m  Temp.: 1F (Temporal)  Pulse: 99 (Regular)  BP: 144/84(Sitting, Left Arm, Standard)  Review of Systems Sunday Spillers Ledford RN; 07/07/2018 7:28 AM) General Not Present- Appetite Loss, Chills, Fatigue, Fever, Night Sweats, Weight Gain and Weight Loss. Skin Not Present- Change in Wart/Mole, Dryness, Hives, Jaundice, New Lesions, Non-Healing Wounds, Rash and Ulcer. HEENT Not Present- Earache, Hearing Loss, Hoarseness, Nose Bleed, Oral Ulcers, Ringing in the Ears, Seasonal Allergies, Sinus Pain, Sore Throat, Visual Disturbances, Wears glasses/contact lenses and Yellow Eyes. Respiratory Not Present- Bloody  sputum, Chronic Cough, Difficulty Breathing, Snoring and Wheezing. Breast Not Present- Breast Mass, Breast Pain, Nipple Discharge and Skin Changes. Cardiovascular Not Present- Chest Pain, Difficulty Breathing Lying Down, Leg Cramps, Palpitations, Rapid Heart Rate, Shortness of Breath and Swelling of Extremities. Gastrointestinal Not Present- Abdominal Pain, Bloating, Bloody Stool, Change in Bowel Habits, Chronic diarrhea, Constipation, Difficulty Swallowing, Excessive gas, Gets full quickly at meals, Hemorrhoids, Indigestion, Nausea, Rectal Pain and Vomiting. Female Genitourinary Not Present- Frequency, Nocturia, Painful Urination, Pelvic Pain and Urgency. Musculoskeletal Not Present- Back Pain, Joint Pain, Joint Stiffness, Muscle Pain, Muscle Weakness and Swelling of Extremities. Neurological Not Present- Decreased Memory, Fainting, Headaches, Numbness, Seizures, Tingling, Tremor, Trouble walking and Weakness. Psychiatric Not Present- Anxiety, Bipolar, Change in Sleep Pattern, Depression, Fearful and Frequent crying. Endocrine Not Present- Cold Intolerance, Excessive Hunger, Hair Changes, Heat Intolerance, Hot flashes and New Diabetes. Hematology Not Present- Blood Thinners, Easy Bruising, Excessive bleeding, Gland problems, HIV and Persistent Infections.   Physical Exam Eddie Dibbles S. [name] MD; 07/07/2018 3:34 PM) General Mental Status-Alert. General Appearance-Consistent with stated age. Hydration-Well hydrated. Voice-Normal.  Head and Neck Head-normocephalic, atraumatic with no lesions or palpable masses. Trachea-midline. Thyroid Gland Characteristics - normal size and consistency.  Eye Eyeball - Bilateral-Extraocular movements intact. Sclera/Conjunctiva - Bilateral-No scleral icterus.  Chest and Lung Exam Chest and lung exam reveals -quiet, even and easy respiratory effort with no use of accessory muscles and on auscultation, normal breath sounds, no adventitious sounds  and normal vocal resonance. Inspection Chest Wall - Normal. Back - normal.   Cardiovascular Cardiovascular examination reveals -normal heart sounds, regular rate and rhythm with no murmurs and normal pedal pulses bilaterally.  Abdomen Inspection Inspection of the abdomen reveals - No Hernias. Skin - Scar - no surgical scars.  Palpation/Percussion Palpation and Percussion of the abdomen reveal - Soft, Non Tender, No Rebound tenderness, No Rigidity (guarding) and No hepatosplenomegaly. Auscultation Auscultation of the abdomen reveals - Bowel sounds normal.  Neurologic Neurologic evaluation reveals -alert and oriented x 3 with no impairment of recent or remote memory. Mental Status-Normal.  Musculoskeletal Normal Exam - Left-Upper Extremity Strength Normal and Lower Extremity Strength Normal. Normal Exam - Right-Upper Extremity Strength Normal and Lower Extremity Strength Normal.  Lymphatic Head & Neck  General Head & Neck Lymphatics: Bilateral - Description - Normal. Axillary  General Axillary Region: Bilateral - Description - Normal. Tenderness - Non Tender. Femoral & Inguinal  Generalized Femoral & Inguinal Lymphatics: Bilateral - Description - Normal. Tenderness - Non Tender.     Physical Exam  Breast Note: The left breast and axillary incisions are healing nicely with no sign of infection. She does have a moderate sized seroma in the left axilla. The axilla was prepped with ChloraPrep and infiltrated with 1% lidocaine. I was able to aspirate 120 cc of serous fluid and she tolerated this well.     Assessment & Plan   MALIGNANT NEOPLASM OF UPPER-INNER QUADRANT OF LEFT BREAST IN FEMALE, ESTROGEN RECEPTOR POSITIVE (C50.212) Impression: The patient is about 2 weeks status post left breast lumpectomy for breast cancer. Her postoperative course is complicated by a seroma in the left axilla which we were successfully able to aspirate today. I also went over her  pathology results including the Oncotype score of 32. I did discuss the possibility of needing a Port-A-Cath for chemotherapy with her including the risks and benefits as well as some of the technical aspects of the surgery. She will discuss this with medical oncology and let us know what she decides. Otherwise I'll see her back in about 2 weeks to check for any residual fluid. She should refrain from overhead activity with the left arm for the next 2-3 days  Current Plans Follow up with [place] in the office in 2 weeks.  Call us sooner as needed.

## 2018-09-06 NOTE — Anesthesia Procedure Notes (Signed)
Procedure Name: LMA Insertion Date/Time: 09/06/2018 12:16 PM Performed by: Signe Colt, CRNA Pre-anesthesia Checklist: Patient identified, Emergency Drugs available, Suction available and Patient being monitored Patient Re-evaluated:Patient Re-evaluated prior to induction Oxygen Delivery Method: Circle system utilized Preoxygenation: Pre-oxygenation with 100% oxygen Induction Type: IV induction Ventilation: Mask ventilation without difficulty LMA: LMA inserted LMA Size: 4.0 Number of attempts: 1 Airway Equipment and Method: Bite block Placement Confirmation: positive ETCO2 Tube secured with: Tape Dental Injury: Teeth and Oropharynx as per pre-operative assessment

## 2018-09-06 NOTE — Transfer of Care (Signed)
Immediate Anesthesia Transfer of Care Note  Patient: Caitlin Graham  Procedure(s) Performed: INSERTION PORT-A-CATHETER (Right Chest)  Patient Location: PACU  Anesthesia Type:General  Level of Consciousness: awake, alert , oriented and patient cooperative  Airway & Oxygen Therapy: Patient Spontanous Breathing and Patient connected to face mask oxygen  Post-op Assessment: Report given to RN and Post -op Vital signs reviewed and stable  Post vital signs: Reviewed and stable  Last Vitals:  Vitals Value Taken Time  BP    Temp    Pulse    Resp    SpO2      Last Pain:  Vitals:   09/06/18 1025  TempSrc: Oral  PainSc: 3       Patients Stated Pain Goal: 8 ([id number])  Complications: No apparent anesthesia complications

## 2018-09-06 NOTE — Discharge Instructions (Signed)
No Tylenol until 4:30 pm!    Post Anesthesia Home Care Instructions  Activity: Get plenty of rest for the remainder of the day. A responsible individual must stay with you for 24 hours following the procedure.  For the next 24 hours, DO NOT: -Drive a car -Paediatric nurse -Drink alcoholic beverages -Take any medication unless instructed by your physician -Make any legal decisions or sign important papers.  Meals: Start with liquid foods such as gelatin or soup. Progress to regular foods as tolerated. Avoid greasy, spicy, heavy foods. If nausea and/or vomiting occur, drink only clear liquids until the nausea and/or vomiting subsides. Call your physician if vomiting continues.  Special Instructions/Symptoms: Your throat may feel dry or sore from the anesthesia or the breathing tube placed in your throat during surgery. If this causes discomfort, gargle with warm salt water. The discomfort should disappear within 24 hours.  If you had a scopolamine patch placed behind your ear for the management of post- operative nausea and/or vomiting:  1. The medication in the patch is effective for 72 hours, after which it should be removed.  Wrap patch in a tissue and discard in the trash. Wash hands thoroughly with soap and water. 2. You may remove the patch earlier than 72 hours if you experience unpleasant side effects which may include dry mouth, dizziness or visual disturbances. 3. Avoid touching the patch. Wash your hands with soap and water after contact with the patch.

## 2018-09-06 NOTE — Progress Notes (Signed)
Patient Care Team: Patient, [name] as PCP - General (General Practice) [name], RN as Oncology Nurse Navigator [name], RN as Oncology Nurse Navigator [name], MD as Consulting Physician (Hematology and Oncology) [name], MD as Consulting Physician (General Surgery) [name], MD as Attending Physician (Radiation Oncology)  DIAGNOSIS:    ICD-10-CM   1. Malignant neoplasm of upper-inner quadrant of left breast in female, estrogen receptor positive ([place])  C50.212    Z17.0     SUMMARY OF ONCOLOGIC HISTORY: Oncology History  Malignant neoplasm of upper-inner quadrant of left breast in female, estrogen receptor positive ([place])  07/05/2018 Initial Diagnosis   Screening mammogram detected bilateral breast masses. Diagnostic mammogram and US showed cysts in the right breast and a 1.2cm left breast mass at the 9 o'clock position. 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    07/07/2018 Cancer Staging   Staging form: Breast, AJCC 8th Edition - Clinical stage from 07/07/2018: Stage IA (cT1c, cN0, cM0, G2, ER+, PR+, HER2-) - Signed by [name], MD on 07/07/2018    Genetic Testing   Negative testing. No pathogenic variants identified on the [facility] Common Hereditary Cancers Panel. The Common Hereditary Cancers Panel offered by [facility] includes sequencing and/or deletion duplication testing of the following 47 genes: APC, ATM, AXIN2, BARD1, BMPR1A, BRCA1, BRCA2, BRIP1, CDH1, CDKN2A (p14ARF), CDKN2A (p16INK4a), CKD4, CHEK2, CTNNA1, DICER1, EPCAM (Deletion/duplication testing only), GREM1 (promoter region deletion/duplication testing only), KIT, MEN1, MLH1, MSH2, MSH3, MSH6, MUTYH, NBN, NF1, NHTL1, PALB2, PDGFRA, PMS2, POLD1, POLE, PTEN, RAD50, RAD51C, RAD51D, SDHB, SDHC, SDHD, SMAD4, SMARCA4. STK11, TP53, TSC1, TSC2, and VHL.  The following genes were evaluated for sequence changes only: SDHA and HOXB13 c.251G>A variant only. The  report date is 07/18/2018.   08/04/2018 Surgery   Left lumpectomy [name]): IDC with intermediate grade DCIS, 1.2cm, grade 1, clear margins, with 3 axillary lymph nodes negative for carcinoma ER 100%, PR 60%, Ki-67 50%   09/07/2018 -  Chemotherapy   The patient had DOXOrubicin (ADRIAMYCIN) chemo injection 146 mg, 60 mg/m2 = 146 mg, Intravenous,  Once, 0 of 4 cycles palonosetron (ALOXI) injection 0.25 mg, 0.25 mg, Intravenous,  Once, 0 of 4 cycles pegfilgrastim-jmdb (FULPHILA) injection 6 mg, 6 mg, Subcutaneous,  Once, 0 of 4 cycles cyclophosphamide (CYTOXAN) 1,460 mg in sodium chloride 0.9 % 250 mL chemo infusion, 600 mg/m2 = 1,460 mg, Intravenous,  Once, 0 of 4 cycles PACLitaxel (TAXOL) 198 mg in sodium chloride 0.9 % 250 mL chemo infusion (</= 61m/m2), 80 mg/m2 = 198 mg, Intravenous,  Once, 0 of 12 cycles fosaprepitant (EMEND) 150 mg, dexamethasone (DECADRON) 12 mg in sodium chloride 0.9 % 145 mL IVPB, , Intravenous,  Once, 0 of 4 cycles  for chemotherapy treatment.      CHIEF COMPLIANT: Cycle 1 Adriamycin and Cytoxan   INTERVAL HISTORY: IBrityn Mastrogiovanniis a 53y.o. with above-mentioned history of left breast cancer who underwent a lumpectomy. Oncotype DX testing showed she was high risk. Her port was inserted by Dr. TMarlou Starkson 09/06/18. She is currently on adjuvant chemotherapy treatment with dose dense Adriamycin and Cytoxan. She presents to the clinic today for cycle 1.  Patient is in a lot of pain from the port placement yesterday.  She is also extremely anxious and nervous.  REVIEW OF SYSTEMS:   Constitutional: Denies fevers, chills or abnormal weight loss Eyes: Denies blurriness of vision Ears, nose, mouth, throat, and face: Denies  mucositis or sore throat Respiratory: Denies cough, dyspnea or wheezes Cardiovascular: Denies palpitation, chest discomfort Gastrointestinal: Denies nausea, heartburn or change in bowel habits Skin: Denies abnormal skin rashes Lymphatics: Denies new lymphadenopathy  or easy bruising Neurological: Denies numbness, tingling or new weaknesses Behavioral/Psych: Mood is stable, no new changes  Extremities: No lower extremity edema Breast: denies any pain or lumps or nodules in either breasts All other systems were reviewed with the patient and are negative.  I have reviewed the past medical history, past surgical history, social history and family history with the patient and they are unchanged from previous note.  ALLERGIES:  is allergic to aspirin; ibuprofen; hydrocodone; oxycodone; and phenergan [promethazine].  MEDICATIONS:  Current Outpatient Medications  Medication Sig Dispense Refill   acetaminophen (TYLENOL) 325 MG tablet Take 650 mg by mouth every 6 (six) hours as needed.     dexamethasone (DECADRON) 4 MG tablet Take 1 tablet day after chemo and 1 tablet 2 days after chemo with food 8 tablet 0   lidocaine-prilocaine (EMLA) cream Apply to affected area once 30 g 3   LORazepam (ATIVAN) 0.5 MG tablet Take 1 tablet (0.5 mg total) by mouth at bedtime as needed for sleep. 30 tablet 0   ondansetron (ZOFRAN) 8 MG tablet Take 1 tablet (8 mg total) by mouth 2 (two) times daily as needed. Start on the third day after chemotherapy. 30 tablet 1   prochlorperazine (COMPAZINE) 10 MG tablet Take 1 tablet (10 mg total) by mouth every 6 (six) hours as needed (Nausea or vomiting). 30 tablet 1   tamoxifen (NOLVADEX) 20 MG tablet Take 1 tablet (20 mg total) by mouth daily. 30 tablet 0   traMADol (ULTRAM) 50 MG tablet Take 50 mg by mouth every 6 (six) hours as needed.     traMADol (ULTRAM) 50 MG tablet Take 1-2 tablets (50-100 mg total) by mouth every 6 (six) hours as needed. 10 tablet 0   No current facility-administered medications for this visit.     PHYSICAL EXAMINATION: ECOG PERFORMANCE STATUS: 1 - Symptomatic but completely ambulatory  Vitals:   09/07/18 1113  BP: 126/71  Pulse: 71  Resp: 17  Temp: 97.8 F (36.6 C)  SpO2: 98%   Filed Weights     09/07/18 1113  Weight: 282 lb 6.4 oz (128.1 kg)    GENERAL: alert, no distress and comfortable SKIN: skin color, texture, turgor are normal, no rashes or significant lesions EYES: normal, Conjunctiva are pink and non-injected, sclera clear OROPHARYNX: no exudate, no erythema and lips, buccal mucosa, and tongue normal  NECK: supple, thyroid normal size, non-tender, without nodularity LYMPH: no palpable lymphadenopathy in the cervical, axillary or inguinal LUNGS: clear to auscultation and percussion with normal breathing effort HEART: regular rate & rhythm and no murmurs and no lower extremity edema ABDOMEN: abdomen soft, non-tender and normal bowel sounds MUSCULOSKELETAL: no cyanosis of digits and no clubbing  NEURO: alert & oriented x 3 with fluent speech, no focal motor/sensory deficits EXTREMITIES: No lower extremity edema  LABORATORY DATA:  I have reviewed the data as listed CMP Latest Ref Rng & Units 07/07/2018  Glucose 70 - 99 mg/dL 126(H)  BUN 6 - 20 mg/dL 12  Creatinine 0.44 - 1.00 mg/dL 0.82  Sodium 135 - 145 mmol/L 138  Potassium 3.5 - 5.1 mmol/L 4.0  Chloride 98 - 111 mmol/L 107  CO2 22 - 32 mmol/L 23  Calcium 8.9 - 10.3 mg/dL 8.7(L)  Total Protein 6.5 - 8.1 g/dL 7.1  Total Bilirubin 0.3 - 1.2 mg/dL 0.4  Alkaline Phos 38 - 126 U/L 83  AST 15 - 41 U/L 9(L)  ALT 0 - 44 U/L 9    Lab Results  Component Value Date   WBC 15.8 (H) 09/07/2018   HGB 11.9 (L) 09/07/2018   HCT 37.5 09/07/2018   MCV 91.2 09/07/2018   PLT 288 09/07/2018   NEUTROABS 11.7 (H) 09/07/2018    ASSESSMENT & PLAN:  Malignant neoplasm of upper-inner quadrant of left breast in female, estrogen receptor positive ([place]) 07/05/2018:Screening mammogram detected bilateral breast masses. Diagnostic mammogram and US showed cysts in the right breast and a 1.2cm left breast mass at the 9 o'clock position. 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 Stage Ia Brief course  of neoadjuvant tamoxifen therapy started 07/07/2018 08/04/2018: Left lumpectomy:Left lumpectomy [name]): IDC with intermediate grade DCIS, 1.2cm, grade 1, clear margins, with 3 axillary lymph nodes negative for carcinoma ER 100%, PR 60%, Ki-67 50%  Oncotype DX score: 32, distant recurrence at 9 years with hormone therapy alone: 20%  Treatment plan: 1.  Adjuvant chemotherapy with dose dense Adriamycin and Cytoxan x4 followed by Taxol x12 2.  Given radiation therapy followed by 3.  Adjuvant antiestrogen therapy ----------------------------------------------------------------------------------------------------------------------------- Current treatment: Cycle 1 day 1 dose dense Adriamycin Cytoxan Antiemetics were reviewed Chemotherapy consent obtained Chemotherapy education completed Echocardiogram 09/01/2018: EF 60-65 % Closely monitoring for chemotherapy toxicities. Return to clinic in one week for toxicity check Pain related to port placement yesterday: We will give 1 mg of morphine today.  She cannot tolerate Percocets but she cannot tolerate morphine.    No orders of the defined types were placed in this encounter.  The patient has a good understanding of the overall plan. she agrees with it. she will call with any problems that may develop before the next visit here.  [name], MD 09/07/2018  Julious Oka Dorshimer am acting as scribe for Dr. [name].  I have reviewed the above documentation for accuracy and completeness, and I agree with the above.

## 2018-09-06 NOTE — Interval H&P Note (Signed)
History and Physical Interval Note:  09/06/2018 11:55 AM  Caitlin Graham  has presented today for surgery, with the diagnosis of LEFT BREAST CANCER.  The various methods of treatment have been discussed with the patient and family. After consideration of risks, benefits and other options for treatment, the patient has consented to  Procedure(s): INSERTION PORT-A-CATH POSSIBLE [place] (N/A) as a surgical intervention.  The patient's history has been reviewed, patient examined, no change in status, stable for surgery.  I have reviewed the patient's chart and labs.  Questions were answered to the patient's satisfaction.     Autumn Messing III

## 2018-09-06 NOTE — Anesthesia Preprocedure Evaluation (Addendum)
Anesthesia Evaluation  Patient identified by MRN, date of birth, ID band Patient awake    Reviewed: Allergy & Precautions, NPO status , Patient's Chart, lab work & pertinent test results  Airway Mallampati: I  TM Distance: >3 FB Neck ROM: Full    Dental no notable dental hx. (+) Teeth Intact, Dental Advisory Given   Pulmonary sleep apnea (does not wear CPAP) ,    Pulmonary exam normal breath sounds clear to auscultation       Cardiovascular negative cardio ROS Normal cardiovascular exam Rhythm:Regular Rate:Normal  TTE 08/2018 EF 60-65%, no valvular abnormalities    Neuro/Psych PSYCHIATRIC DISORDERS Depression negative neurological ROS     GI/Hepatic negative GI ROS, Neg liver ROS,   Endo/Other  Morbid obesity  Renal/GU negative Renal ROS  negative genitourinary   Musculoskeletal negative musculoskeletal ROS (+)   Abdominal   Peds  Hematology negative hematology ROS (+)   Anesthesia Other Findings Left breast cancer  Reproductive/Obstetrics                            Anesthesia Physical Anesthesia Plan  ASA: III  Anesthesia Plan: General   Post-op Pain Management:    Induction: Intravenous  PONV Risk Score and Plan: 3 and Ondansetron, Dexamethasone and Midazolam  Airway Management Planned: LMA  Additional Equipment:   Intra-op Plan:   Post-operative Plan: Extubation in OR  Informed Consent: I have reviewed the patients History and Physical, chart, labs and discussed the procedure including the risks, benefits and alternatives for the proposed anesthesia with the patient or authorized representative who has indicated his/her understanding and acceptance.     Dental advisory given  Plan Discussed with: CRNA  Anesthesia Plan Comments:         Anesthesia Quick Evaluation

## 2018-09-06 NOTE — Op Note (Signed)
09/06/2018  1:11 PM  PATIENT:  Caitlin Graham  53 y.o. female  PRE-OPERATIVE DIAGNOSIS:  LEFT BREAST CANCER  POST-OPERATIVE DIAGNOSIS:  LEFT BREAST CANCER  PROCEDURE:  Procedure(s): INSERTION PORT-A-CATH (Right)  SURGEON:  Surgeon(s) and Role:    * [name], MD - Primary  PHYSICIAN ASSISTANT:   ASSISTANTS: none   ANESTHESIA:   local and general  EBL:  minimal   BLOOD ADMINISTERED:none  DRAINS: none   LOCAL MEDICATIONS USED:  MARCAINE     SPECIMEN:  No Specimen  DISPOSITION OF SPECIMEN:  N/A  COUNTS:  YES  TOURNIQUET:  * No tourniquets in log *  DICTATION: .Dragon Dictation   After informed consent was obtained the patient was brought to the operating room and placed in the supine position on the operating table.  After adequate induction of general anesthesia a roll was placed between the patient's shoulder blades to extend the shoulder slightly.  The right chest and neck area were then prepped with ChloraPrep, allowed to dry, and draped in usual sterile manner.  An appropriate timeout was performed.  The area lateral to the bend of the clavicle on the right chest wall was infiltrated with quarter percent Marcaine.  The patient was placed in Trendelenburg position.  I attempted to access the right subclavian vein with the finder needle from the Port-A-Cath kit but given her obesity I could not safely get behind the clavicle.  I decided to move up to the neck to find the right internal jugular vein.  I was able to find this vein with a 10 cc syringe and 22-gauge needle.  I then placed the large bore needle from the Port-A-Cath kit into the right internal jugular vein without difficulty.  The wire was fed through the needle using the Seldinger technique without difficulty.  The wire was confirmed in the central venous system using real-time fluoroscopy.  Next a small incision was made with a 15 blade knife at the wire entry site as well as along the right chest wall.  The  incision on the right chest wall was opened through the skin and subcutaneous tissue sharply with the electrocautery.  A subcutaneous pocket was created by blunt finger dissection inferior to the incision.  Next blunt hemostat dissection was carried out between the incision on the chest wall and the incision on the neck.  I was then able to place a tunneling device through this to connect the 2 incisions.  The tubing was brought through the tunnel from the neck to the chest wall.  The tubing was placed on the reservoir and the reservoir was placed in the pocket and the length of the tubing was estimated using real-time fluoroscopy and cut to the appropriate length.  Next a sheath and dilator were fed over the wire also using the Seldinger technique.  The dilator and wire were removed from the patient.  The tubing was fed through the sheath as far as it would go and then held in place while the sheath was gently cracked and separated.  Another real-time fluoroscopy image showed the tip of the catheter to be in the right heart so I removed about a centimeter and a half from the tubing and reattached it to the reservoir.  Another real-time fluoroscopy image showed the tip of the catheter to be in the distal superior vena cava.  I then permanently anchored the tubing to the reservoir.  The reservoir was anchored in the pocket with two 2-0 Prolene stitches.  The port was then aspirated and it aspirated blood easily.  The port was then flushed initially with a dilute heparin solution and then with a more concentrated heparin solution.  The incision in the neck was closed with an interrupted 4-0 Monocryl subcuticular stitch.  Care was taken to avoid the catheter.  The incision on the right chest wall was then closed with a deep layer of interrupted 3-0 Vicryl stitches and the skin was closed with a running 4-0 Monocryl subcuticular stitch.  Dermabond dressings were applied.  The patient tolerated the procedure well.  At  the end of the case all needle sponge and instrument counts were correct.  The patient was then awakened and taken to recovery in stable condition.  The left breast was examined and the color change seemed to be more consistent with some localized ischemia the skin from the undermining dissection.  There did not appear to be a large fluid collection that needed to be aspirated.  PLAN OF CARE: Discharge to home after PACU  PATIENT DISPOSITION:  PACU - hemodynamically stable.   Delay start of Pharmacological VTE agent (>24hrs) due to surgical blood loss or risk of bleeding: not applicable

## 2018-09-07 ENCOUNTER — Inpatient Hospital Stay: Payer: BC Managed Care – PPO | Attending: Hematology and Oncology

## 2018-09-07 ENCOUNTER — Encounter: Payer: Self-pay | Admitting: *Deleted

## 2018-09-07 ENCOUNTER — Telehealth: Payer: Self-pay | Admitting: *Deleted

## 2018-09-07 ENCOUNTER — Encounter (HOSPITAL_BASED_OUTPATIENT_CLINIC_OR_DEPARTMENT_OTHER): Payer: Self-pay | Admitting: General Surgery

## 2018-09-07 ENCOUNTER — Inpatient Hospital Stay (HOSPITAL_BASED_OUTPATIENT_CLINIC_OR_DEPARTMENT_OTHER): Payer: BC Managed Care – PPO | Admitting: Hematology and Oncology

## 2018-09-07 ENCOUNTER — Other Ambulatory Visit: Payer: Self-pay

## 2018-09-07 ENCOUNTER — Inpatient Hospital Stay: Payer: BC Managed Care – PPO

## 2018-09-07 ENCOUNTER — Other Ambulatory Visit: Payer: BC Managed Care – PPO

## 2018-09-07 DIAGNOSIS — D6481 Anemia due to antineoplastic chemotherapy: Secondary | ICD-10-CM | POA: Insufficient documentation

## 2018-09-07 DIAGNOSIS — R110 Nausea: Secondary | ICD-10-CM | POA: Diagnosis not present

## 2018-09-07 DIAGNOSIS — C50212 Malignant neoplasm of upper-inner quadrant of left female breast: Secondary | ICD-10-CM

## 2018-09-07 DIAGNOSIS — R102 Pelvic and perineal pain: Secondary | ICD-10-CM | POA: Insufficient documentation

## 2018-09-07 DIAGNOSIS — Z170 Estrogen receptor positive status [ER+]: Secondary | ICD-10-CM

## 2018-09-07 DIAGNOSIS — K5903 Drug induced constipation: Secondary | ICD-10-CM | POA: Diagnosis not present

## 2018-09-07 DIAGNOSIS — Z23 Encounter for immunization: Secondary | ICD-10-CM | POA: Insufficient documentation

## 2018-09-07 DIAGNOSIS — Z95828 Presence of other vascular implants and grafts: Secondary | ICD-10-CM

## 2018-09-07 DIAGNOSIS — D696 Thrombocytopenia, unspecified: Secondary | ICD-10-CM | POA: Insufficient documentation

## 2018-09-07 DIAGNOSIS — Z5111 Encounter for antineoplastic chemotherapy: Secondary | ICD-10-CM | POA: Diagnosis not present

## 2018-09-07 DIAGNOSIS — D72819 Decreased white blood cell count, unspecified: Secondary | ICD-10-CM | POA: Diagnosis not present

## 2018-09-07 DIAGNOSIS — Z5189 Encounter for other specified aftercare: Secondary | ICD-10-CM | POA: Insufficient documentation

## 2018-09-07 LAB — CBC WITH DIFFERENTIAL (CANCER CENTER ONLY)
Abs Immature Granulocytes: 0.11 10*3/uL — ABNORMAL HIGH (ref 0.00–0.07)
Basophils Absolute: 0.1 10*3/uL (ref 0.0–0.1)
Basophils Relative: 1 %
Eosinophils Absolute: 0 10*3/uL (ref 0.0–0.5)
Eosinophils Relative: 0 %
HCT: 37.5 % (ref 36.0–46.0)
Hemoglobin: 11.9 g/dL — ABNORMAL LOW (ref 12.0–15.0)
Immature Granulocytes: 1 %
Lymphocytes Relative: 16 %
Lymphs Abs: 2.6 10*3/uL (ref 0.7–4.0)
MCH: 29 pg (ref 26.0–34.0)
MCHC: 31.7 g/dL (ref 30.0–36.0)
MCV: 91.2 fL (ref 80.0–100.0)
Monocytes Absolute: 1.4 10*3/uL — ABNORMAL HIGH (ref 0.1–1.0)
Monocytes Relative: 9 %
Neutro Abs: 11.7 10*3/uL — ABNORMAL HIGH (ref 1.7–7.7)
Neutrophils Relative %: 73 %
Platelet Count: 288 10*3/uL (ref 150–400)
RBC: 4.11 MIL/uL (ref 3.87–5.11)
RDW: 15.6 % — ABNORMAL HIGH (ref 11.5–15.5)
WBC Count: 15.8 10*3/uL — ABNORMAL HIGH (ref 4.0–10.5)
nRBC: 0 % (ref 0.0–0.2)

## 2018-09-07 LAB — CMP (CANCER CENTER ONLY)
ALT: 118 U/L — ABNORMAL HIGH (ref 0–44)
AST: 73 U/L — ABNORMAL HIGH (ref 15–41)
Albumin: 3.3 g/dL — ABNORMAL LOW (ref 3.5–5.0)
Alkaline Phosphatase: 170 U/L — ABNORMAL HIGH (ref 38–126)
Anion gap: 9 (ref 5–15)
BUN: 11 mg/dL (ref 6–20)
CO2: 24 mmol/L (ref 22–32)
Calcium: 8.8 mg/dL — ABNORMAL LOW (ref 8.9–10.3)
Chloride: 107 mmol/L (ref 98–111)
Creatinine: 0.8 mg/dL (ref 0.44–1.00)
GFR, Est AFR Am: >60 mL/min (ref >60)
GFR, Estimated: >60 mL/min (ref >60)
Glucose, Bld: 108 mg/dL — ABNORMAL HIGH (ref 70–99)
Potassium: 3.8 mmol/L (ref 3.5–5.1)
Sodium: 140 mmol/L (ref 135–145)
Total Bilirubin: 0.5 mg/dL (ref 0.3–1.2)
Total Protein: 7.1 g/dL (ref 6.5–8.1)

## 2018-09-07 MED ORDER — PALONOSETRON HCL INJECTION 0.25 MG/5ML
0.2500 mg | Freq: Once | INTRAVENOUS | Status: AC
  Administered 2018-09-07: 0.25 mg via INTRAVENOUS

## 2018-09-07 MED ORDER — HEPARIN SOD (PORK) LOCK FLUSH 100 UNIT/ML IV SOLN
500.0000 [IU] | Freq: Once | INTRAVENOUS | Status: AC | PRN
  Administered 2018-09-07: 500 [IU]
  Filled 2018-09-07: qty 5

## 2018-09-07 MED ORDER — SODIUM CHLORIDE 0.9% FLUSH
10.0000 mL | INTRAVENOUS | Status: DC | PRN
  Administered 2018-09-07: 10:00:00 10 mL via INTRAVENOUS
  Filled 2018-09-07: qty 10

## 2018-09-07 MED ORDER — SODIUM CHLORIDE 0.9 % IV SOLN
Freq: Once | INTRAVENOUS | Status: AC
  Administered 2018-09-07: 13:00:00 via INTRAVENOUS
  Filled 2018-09-07: qty 5

## 2018-09-07 MED ORDER — SODIUM CHLORIDE 0.9 % IV SOLN
600.0000 mg/m2 | Freq: Once | INTRAVENOUS | Status: AC
  Administered 2018-09-07: 14:00:00 1460 mg via INTRAVENOUS
  Filled 2018-09-07: qty 73

## 2018-09-07 MED ORDER — MORPHINE SULFATE 4 MG/ML IJ SOLN
1.0000 mg | Freq: Once | INTRAMUSCULAR | Status: AC
  Administered 2018-09-07: 1 mg via INTRAVENOUS
  Filled 2018-09-07: qty 1

## 2018-09-07 MED ORDER — SODIUM CHLORIDE 0.9 % IV SOLN
Freq: Once | INTRAVENOUS | Status: AC
  Administered 2018-09-07: 13:00:00 via INTRAVENOUS
  Filled 2018-09-07: qty 250

## 2018-09-07 MED ORDER — MORPHINE SULFATE (PF) 4 MG/ML IV SOLN
INTRAVENOUS | Status: AC
  Filled 2018-09-07: qty 1

## 2018-09-07 MED ORDER — SODIUM CHLORIDE 0.9% FLUSH
10.0000 mL | INTRAVENOUS | Status: DC | PRN
  Administered 2018-09-07: 15:00:00 10 mL
  Filled 2018-09-07: qty 10

## 2018-09-07 MED ORDER — DOXORUBICIN HCL CHEMO IV INJECTION 2 MG/ML
60.0000 mg/m2 | Freq: Once | INTRAVENOUS | Status: AC
  Administered 2018-09-07: 146 mg via INTRAVENOUS
  Filled 2018-09-07: qty 73

## 2018-09-07 MED ORDER — PALONOSETRON HCL INJECTION 0.25 MG/5ML
INTRAVENOUS | Status: AC
  Filled 2018-09-07: qty 5

## 2018-09-07 NOTE — Patient Instructions (Signed)

## 2018-09-07 NOTE — Progress Notes (Signed)
OK to treat with AST and ALT today per MD Lindi Adie

## 2018-09-07 NOTE — Assessment & Plan Note (Signed)
07/05/2018:Screening mammogram detected bilateral breast masses. Diagnostic mammogram and US showed cysts in the right breast and a 1.2cm left breast mass at the 9 o'clock position. 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 Stage Ia Brief course of neoadjuvant tamoxifen therapy started 07/07/2018 08/04/2018: Left lumpectomy:Left lumpectomy Caitlin Graham): IDC with intermediate grade DCIS, 1.2cm, grade 1, clear margins, with 3 axillary lymph nodes negative for carcinoma ER 100%, PR 60%, Ki-67 50%  Oncotype DX score: 32, distant recurrence at 9 years with hormone therapy alone: 20%  Treatment plan: 1.  Adjuvant chemotherapy with dose dense Adriamycin and Cytoxan x4 followed by Taxol x12 2.  Given radiation therapy followed by 3.  Adjuvant antiestrogen therapy ----------------------------------------------------------------------------------------------------------------------------- Current treatment: Cycle 1 day 1 dose dense Adriamycin Cytoxan Antiemetics were reviewed Chemotherapy consent obtained Chemotherapy education completed Echocardiogram 09/01/2018: EF 60-65 % Closely monitoring for chemotherapy toxicities. Return to clinic in one week for toxicity check

## 2018-09-07 NOTE — Telephone Encounter (Signed)
Left vm for pt during 1st chemo. Contact information provided for questions or needs.

## 2018-09-07 NOTE — Patient Instructions (Signed)
[facility] Discharge Instructions for Patients Receiving Chemotherapy  Today you received the following chemotherapy agents Adriamycin and Cytoxan   To help prevent nausea and vomiting after your treatment, we encourage you to take your nausea medication as directed. No Zofran for three days. Take Compazine instead.   If you develop nausea and vomiting that is not controlled by your nausea medication, call the clinic.   BELOW ARE SYMPTOMS THAT SHOULD BE REPORTED IMMEDIATELY:  *FEVER GREATER THAN 100.5 F  *CHILLS WITH OR WITHOUT FEVER  NAUSEA AND VOMITING THAT IS NOT CONTROLLED WITH YOUR NAUSEA MEDICATION  *UNUSUAL SHORTNESS OF BREATH  *UNUSUAL BRUISING OR BLEEDING  TENDERNESS IN MOUTH AND THROAT WITH OR WITHOUT PRESENCE OF ULCERS  *URINARY PROBLEMS  *BOWEL PROBLEMS  UNUSUAL RASH Items with * indicate a potential emergency and should be followed up as soon as possible.  Feel free to call the clinic should you have any questions or concerns. The clinic phone number is [phone].  Please show the [place] at check-in to the Emergency Department and triage nurse.  Doxorubicin injection What is this medicine? DOXORUBICIN (dox oh ROO bi sin) is a chemotherapy drug. It is used to treat many kinds of cancer like leukemia, lymphoma, neuroblastoma, sarcoma, and Wilms' tumor. It is also used to treat bladder cancer, breast cancer, lung cancer, ovarian cancer, stomach cancer, and thyroid cancer. This medicine may be used for other purposes; ask your health care provider or pharmacist if you have questions. COMMON BRAND NAME(S): Adriamycin, Adriamycin PFS, Adriamycin RDF, Rubex What should I tell my health care provider before I take this medicine? They need to know if you have any of these conditions:  heart disease  history of low blood counts caused by a medicine  liver disease  recent or ongoing radiation therapy  an unusual or allergic reaction to  doxorubicin, other chemotherapy agents, other medicines, foods, dyes, or preservatives  pregnant or trying to get pregnant  breast-feeding How should I use this medicine? This drug is given as an infusion into a vein. It is administered in a hospital or clinic by a specially trained health care professional. If you have pain, swelling, burning or any unusual feeling around the site of your injection, tell your health care professional right away. Talk to your pediatrician regarding the use of this medicine in children. Special care may be needed. Overdosage: If you think you have taken too much of this medicine contact a poison control center or emergency room at once. NOTE: This medicine is only for you. Do not share this medicine with others. What if I miss a dose? It is important not to miss your dose. Call your doctor or health care professional if you are unable to keep an appointment. What may interact with this medicine? This medicine may interact with the following medications:  6-mercaptopurine  paclitaxel  phenytoin  St. John's Wort  trastuzumab  verapamil This list may not describe all possible interactions. Give your health care provider a list of all the medicines, herbs, non-prescription drugs, or dietary supplements you use. Also tell them if you smoke, drink alcohol, or use illegal drugs. Some items may interact with your medicine. What should I watch for while using this medicine? This drug may make you feel generally unwell. This is not uncommon, as chemotherapy can affect healthy cells as well as cancer cells. Report any side effects. Continue your course of treatment even though you feel ill unless your doctor  tells you to stop. There is a maximum amount of this medicine you should receive throughout your life. The amount depends on the medical condition being treated and your overall health. Your doctor will watch how much of this medicine you receive in your  lifetime. Tell your doctor if you have taken this medicine before. You may need blood work done while you are taking this medicine. Your urine may turn red for a few days after your dose. This is not blood. If your urine is dark or brown, call your doctor. In some cases, you may be given additional medicines to help with side effects. Follow all directions for their use. Call your doctor or health care professional for advice if you get a fever, chills or sore throat, or other symptoms of a cold or flu. Do not treat yourself. This drug decreases your body's ability to fight infections. Try to avoid being around people who are sick. This medicine may increase your risk to bruise or bleed. Call your doctor or health care professional if you notice any unusual bleeding. Talk to your doctor about your risk of cancer. You may be more at risk for certain types of cancers if you take this medicine. Do not become pregnant while taking this medicine or for 6 months after stopping it. Women should inform their doctor if they wish to become pregnant or think they might be pregnant. Men should not father a child while taking this medicine and for 6 months after stopping it. There is a potential for serious side effects to an unborn child. Talk to your health care professional or pharmacist for more information. Do not breast-feed an infant while taking this medicine. This medicine has caused ovarian failure in some women and reduced sperm counts in some men This medicine may interfere with the ability to have a child. Talk with your doctor or health care professional if you are concerned about your fertility. This medicine may cause a decrease in Co-Enzyme Q-10. You should make sure that you get enough Co-Enzyme Q-10 while you are taking this medicine. Discuss the foods you eat and the vitamins you take with your health care professional. What side effects may I notice from receiving this medicine? Side effects that  you should report to your doctor or health care professional as soon as possible:  allergic reactions like skin rash, itching or hives, swelling of the face, lips, or tongue  breathing problems  chest pain  fast or irregular heartbeat  low blood counts - this medicine may decrease the number of white blood cells, red blood cells and platelets. You may be at increased risk for infections and bleeding.  pain, redness, or irritation at site where injected  signs of infection - fever or chills, cough, sore throat, pain or difficulty passing urine  signs of decreased platelets or bleeding - bruising, pinpoint red spots on the skin, black, tarry stools, blood in the urine  swelling of the ankles, feet, hands  tiredness  weakness Side effects that usually do not require medical attention (report to your doctor or health care professional if they continue or are bothersome):  diarrhea  hair loss  mouth sores  nail discoloration or damage  nausea  red colored urine  vomiting This list may not describe all possible side effects. Call your doctor for medical advice about side effects. You may report side effects to FDA at 1-800-FDA-1088. Where should I keep my medicine? This drug is given in a hospital  or clinic and will not be stored at home. NOTE: This sheet is a summary. It may not cover all possible information. If you have questions about this medicine, talk to your doctor, pharmacist, or health care provider.  2020 Elsevier/Gold Standard (2016-08-06 11:01:26)  Cyclophosphamide injection What is this medicine? CYCLOPHOSPHAMIDE (sye kloe FOSS fa mide) is a chemotherapy drug. It slows the growth of cancer cells. This medicine is used to treat many types of cancer like lymphoma, myeloma, leukemia, breast cancer, and ovarian cancer, to name a few. This medicine may be used for other purposes; ask your health care provider or pharmacist if you have questions. COMMON BRAND  NAME(S): Cytoxan, Neosar What should I tell my health care provider before I take this medicine? They need to know if you have any of these conditions:  blood disorders  history of other chemotherapy  infection  kidney disease  liver disease  recent or ongoing radiation therapy  tumors in the bone marrow  an unusual or allergic reaction to cyclophosphamide, other chemotherapy, other medicines, foods, dyes, or preservatives  pregnant or trying to get pregnant  breast-feeding How should I use this medicine? This drug is usually given as an injection into a vein or muscle or by infusion into a vein. It is administered in a hospital or clinic by a specially trained health care professional. Talk to your pediatrician regarding the use of this medicine in children. Special care may be needed. Overdosage: If you think you have taken too much of this medicine contact a poison control center or emergency room at once. NOTE: This medicine is only for you. Do not share this medicine with others. What if I miss a dose? It is important not to miss your dose. Call your doctor or health care professional if you are unable to keep an appointment. What may interact with this medicine? This medicine may interact with the following medications:  amiodarone  amphotericin B  azathioprine  certain antiviral medicines for HIV or AIDS such as protease inhibitors (e.g., indinavir, ritonavir) and zidovudine  certain blood pressure medications such as benazepril, captopril, enalapril, fosinopril, lisinopril, moexipril, monopril, perindopril, quinapril, ramipril, trandolapril  certain cancer medications such as anthracyclines (e.g., daunorubicin, doxorubicin), busulfan, cytarabine, paclitaxel, pentostatin, tamoxifen, trastuzumab  certain diuretics such as chlorothiazide, chlorthalidone, hydrochlorothiazide, indapamide, metolazone  certain medicines that treat or prevent blood clots like  warfarin  certain muscle relaxants such as succinylcholine  cyclosporine  etanercept  indomethacin  medicines to increase blood counts like filgrastim, pegfilgrastim, sargramostim  medicines used as general anesthesia  metronidazole  natalizumab This list may not describe all possible interactions. Give your health care provider a list of all the medicines, herbs, non-prescription drugs, or dietary supplements you use. Also tell them if you smoke, drink alcohol, or use illegal drugs. Some items may interact with your medicine. What should I watch for while using this medicine? Visit your doctor for checks on your progress. This drug may make you feel generally unwell. This is not uncommon, as chemotherapy can affect healthy cells as well as cancer cells. Report any side effects. Continue your course of treatment even though you feel ill unless your doctor tells you to stop. Drink water or other fluids as directed. Urinate often, even at night. In some cases, you may be given additional medicines to help with side effects. Follow all directions for their use. Call your doctor or health care professional for advice if you get a fever, chills or sore throat, or  other symptoms of a cold or flu. Do not treat yourself. This drug decreases your body's ability to fight infections. Try to avoid being around people who are sick. This medicine may increase your risk to bruise or bleed. Call your doctor or health care professional if you notice any unusual bleeding. Be careful brushing and flossing your teeth or using a toothpick because you may get an infection or bleed more easily. If you have any dental work done, tell your dentist you are receiving this medicine. You may get drowsy or dizzy. Do not drive, use machinery, or do anything that needs mental alertness until you know how this medicine affects you. Do not become pregnant while taking this medicine or for 1 year after stopping it. Women  should inform their doctor if they wish to become pregnant or think they might be pregnant. Men should not father a child while taking this medicine and for 4 months after stopping it. There is a potential for serious side effects to an unborn child. Talk to your health care professional or pharmacist for more information. Do not breast-feed an infant while taking this medicine. This medicine may interfere with the ability to have a child. This medicine has caused ovarian failure in some women. This medicine has caused reduced sperm counts in some men. You should talk with your doctor or health care professional if you are concerned about your fertility. If you are going to have surgery, tell your doctor or health care professional that you have taken this medicine. What side effects may I notice from receiving this medicine? Side effects that you should report to your doctor or health care professional as soon as possible:  allergic reactions like skin rash, itching or hives, swelling of the face, lips, or tongue  low blood counts - this medicine may decrease the number of white blood cells, red blood cells and platelets. You may be at increased risk for infections and bleeding.  signs of infection - fever or chills, cough, sore throat, pain or difficulty passing urine  signs of decreased platelets or bleeding - bruising, pinpoint red spots on the skin, black, tarry stools, blood in the urine  signs of decreased red blood cells - unusually weak or tired, fainting spells, lightheadedness  breathing problems  dark urine  dizziness  palpitations  swelling of the ankles, feet, hands  trouble passing urine or change in the amount of urine  weight gain  yellowing of the eyes or skin Side effects that usually do not require medical attention (report to your doctor or health care professional if they continue or are bothersome):  changes in nail or skin color  hair loss  missed  menstrual periods  mouth sores  nausea, vomiting This list may not describe all possible side effects. Call your doctor for medical advice about side effects. You may report side effects to FDA at 1-800-FDA-1088. Where should I keep my medicine? This drug is given in a hospital or clinic and will not be stored at home. NOTE: This sheet is a summary. It may not cover all possible information. If you have questions about this medicine, talk to your doctor, pharmacist, or health care provider.  2020 Elsevier/Gold Standard (2011-11-07 16:22:58)

## 2018-09-07 NOTE — Research (Signed)
09/07/2018 at 4:09pm - DCP-001 consent visit- The pt was greeted upon arrival to the cancer center this morning by the research nurse.  The research nurse reminded the pt that she did not meet all of the inclusion/exclusion criteria for study entry into the [facility] study.  The research nurse explained that all pt's screened for [facility] studies are approached for the DCP-001 study.  The research nurse explained to the pt that her participation was voluntary for this one-time collection fo information study.  The pt was given a copy of the DCP consent form and hipaa form.  The pt agreed to read it during her chemo infusion this afternoo.  The research nurse was later contacted by the pt's infusion nurse that she had read the consent and was interested in meeting with the research nurse.  The research nurse met with the pt in the infusion area and answered all of her questions about the study.  The pt signed the consent form and hipaa form at 3:08pm.  The pt was given a copy of her signed forms for her records.  The pt then completed the DCP worksheet.  The pt was thanked for her support of this trial.  The pt has met all of the criteria for DCP-001, and the pt is eligible for enrollment.  The research nurse will complete the DCP Clinical Trial Screening Adult CRF for the DCP-001 study and give it to Remer Macho, research specialist, to enter this pt's enrollment.  Brion Aliment RN, BSN, CCRP Clinical Research Nurse 09/07/2018 4:20 PM

## 2018-09-08 ENCOUNTER — Telehealth: Payer: Self-pay | Admitting: Hematology and Oncology

## 2018-09-08 ENCOUNTER — Telehealth: Payer: Self-pay | Admitting: *Deleted

## 2018-09-08 NOTE — Telephone Encounter (Signed)
Patient called in to reschedule appointments on Mondays early morning

## 2018-09-09 ENCOUNTER — Inpatient Hospital Stay: Payer: BC Managed Care – PPO

## 2018-09-09 ENCOUNTER — Other Ambulatory Visit: Payer: Self-pay

## 2018-09-09 DIAGNOSIS — Z170 Estrogen receptor positive status [ER+]: Secondary | ICD-10-CM

## 2018-09-09 DIAGNOSIS — C50212 Malignant neoplasm of upper-inner quadrant of left female breast: Secondary | ICD-10-CM

## 2018-09-09 DIAGNOSIS — Z5111 Encounter for antineoplastic chemotherapy: Secondary | ICD-10-CM | POA: Diagnosis not present

## 2018-09-09 MED ORDER — PEGFILGRASTIM-JMDB 6 MG/0.6ML ~~LOC~~ SOSY
6.0000 mg | PREFILLED_SYRINGE | Freq: Once | SUBCUTANEOUS | Status: AC
  Administered 2018-09-09: 6 mg via SUBCUTANEOUS
  Filled 2018-09-09: qty 0.6

## 2018-09-09 NOTE — Patient Instructions (Signed)

## 2018-09-10 ENCOUNTER — Encounter: Payer: Self-pay | Admitting: *Deleted

## 2018-09-12 NOTE — Progress Notes (Signed)
Patient Care Team: Patient, [name] as PCP - General (General Practice) [name], RN as Oncology Nurse Navigator [name], RN as Oncology Nurse Navigator [name], MD as Consulting Physician (Hematology and Oncology) [name], MD as Consulting Physician (General Surgery) [name], MD as Attending Physician (Radiation Oncology)  DIAGNOSIS:    ICD-10-CM   1. Malignant neoplasm of upper-inner quadrant of left breast in female, estrogen receptor positive ([place])  C50.212    Z17.0     SUMMARY OF ONCOLOGIC HISTORY: Oncology History  Malignant neoplasm of upper-inner quadrant of left breast in female, estrogen receptor positive ([place])  07/05/2018 Initial Diagnosis   Screening mammogram detected bilateral breast masses. Diagnostic mammogram and US showed cysts in the right breast and a 1.2cm left breast mass at the 9 o'clock position. 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    07/07/2018 Cancer Staging   Staging form: Breast, AJCC 8th Edition - Clinical stage from 07/07/2018: Stage IA (cT1c, cN0, cM0, G2, ER+, PR+, HER2-) - Signed by [name], MD on 07/07/2018    Genetic Testing   Negative testing. No pathogenic variants identified on the [employer] Common Hereditary Cancers Panel. The Common Hereditary Cancers Panel offered by [facility] includes sequencing and/or deletion duplication testing of the following 47 genes: APC, ATM, AXIN2, BARD1, BMPR1A, BRCA1, BRCA2, BRIP1, CDH1, CDKN2A (p14ARF), CDKN2A (p16INK4a), CKD4, CHEK2, CTNNA1, DICER1, EPCAM (Deletion/duplication testing only), GREM1 (promoter region deletion/duplication testing only), KIT, MEN1, MLH1, MSH2, MSH3, MSH6, MUTYH, NBN, NF1, NHTL1, PALB2, PDGFRA, PMS2, POLD1, POLE, PTEN, RAD50, RAD51C, RAD51D, SDHB, SDHC, SDHD, SMAD4, SMARCA4. STK11, TP53, TSC1, TSC2, and VHL.  The following genes were evaluated for sequence changes only: SDHA and HOXB13 c.251G>A variant only. The  report date is 07/18/2018.   08/04/2018 Surgery   Left lumpectomy [name]): IDC with intermediate grade DCIS, 1.2cm, grade 1, clear margins, with 3 axillary lymph nodes negative for carcinoma ER 100%, PR 60%, Ki-67 50%   09/07/2018 -  Chemotherapy   The patient had DOXOrubicin (ADRIAMYCIN) chemo injection 146 mg, 60 mg/m2 = 146 mg, Intravenous,  Once, 1 of 4 cycles Administration: 146 mg (09/07/2018) palonosetron (ALOXI) injection 0.25 mg, 0.25 mg, Intravenous,  Once, 1 of 4 cycles Administration: 0.25 mg (09/07/2018) pegfilgrastim-jmdb (FULPHILA) injection 6 mg, 6 mg, Subcutaneous,  Once, 1 of 4 cycles Administration: 6 mg (09/09/2018) cyclophosphamide (CYTOXAN) 1,460 mg in sodium chloride 0.9 % 250 mL chemo infusion, 600 mg/m2 = 1,460 mg, Intravenous,  Once, 1 of 4 cycles Administration: 1,460 mg (09/07/2018) PACLitaxel (TAXOL) 198 mg in sodium chloride 0.9 % 250 mL chemo infusion (</= 78m/m2), 80 mg/m2 = 198 mg, Intravenous,  Once, 0 of 12 cycles fosaprepitant (EMEND) 150 mg, dexamethasone (DECADRON) 12 mg in sodium chloride 0.9 % 145 mL IVPB, , Intravenous,  Once, 1 of 4 cycles Administration:  (09/07/2018)  for chemotherapy treatment.      CHIEF COMPLIANT: Cycle 1 Day 8 Adriamycin and Cytoxan   INTERVAL HISTORY: IMarkasia Graham a 53y.o. with above-mentioned history of left breast cancer who underwent a lumpectomy and is currently on adjuvant chemotherapy treatment with dose dense Adriamycin and Cytoxan. She presents to the clinic today for a toxicity check following cycle 1.  She complained of constipation which did not get relieved with MiraLAX.  Her appetite is poor as well as energy levels.  She is very anxious about the hair loss that is coming up soon.  REVIEW OF  SYSTEMS:   Constitutional: Complains of fatigue Eyes: Denies blurriness of vision Ears, nose, mouth, throat, and face: Denies mucositis or sore throat Respiratory: Denies cough, dyspnea or wheezes Cardiovascular: Denies  palpitation, chest discomfort Gastrointestinal: Mild nausea with severe constipation Skin: Denies abnormal skin rashes Lymphatics: Denies new lymphadenopathy or easy bruising Neurological: Denies numbness, tingling or new weaknesses Behavioral/Psych: Mood is stable, no new changes  Extremities: No lower extremity edema Breast: denies any pain or lumps or nodules in either breasts All other systems were reviewed with the patient and are negative.  I have reviewed the past medical history, past surgical history, social history and family history with the patient and they are unchanged from previous note.  ALLERGIES:  is allergic to aspirin; ibuprofen; hydrocodone; oxycodone; and phenergan [promethazine].  MEDICATIONS:  Current Outpatient Medications  Medication Sig Dispense Refill   acetaminophen (TYLENOL) 325 MG tablet Take 650 mg by mouth every 6 (six) hours as needed.     dexamethasone (DECADRON) 4 MG tablet Take 1 tablet day after chemo and 1 tablet 2 days after chemo with food 8 tablet 0   lidocaine-prilocaine (EMLA) cream Apply to affected area once 30 g 3   LORazepam (ATIVAN) 0.5 MG tablet Take 1 tablet (0.5 mg total) by mouth at bedtime as needed for sleep. 30 tablet 0   ondansetron (ZOFRAN) 8 MG tablet Take 1 tablet (8 mg total) by mouth 2 (two) times daily as needed. Start on the third day after chemotherapy. 30 tablet 1   prochlorperazine (COMPAZINE) 10 MG tablet Take 1 tablet (10 mg total) by mouth every 6 (six) hours as needed (Nausea or vomiting). 30 tablet 1   tamoxifen (NOLVADEX) 20 MG tablet Take 1 tablet (20 mg total) by mouth daily. 30 tablet 0   traMADol (ULTRAM) 50 MG tablet Take 50 mg by mouth every 6 (six) hours as needed.     traMADol (ULTRAM) 50 MG tablet Take 1-2 tablets (50-100 mg total) by mouth every 6 (six) hours as needed. 10 tablet 0   No current facility-administered medications for this visit.     PHYSICAL EXAMINATION: ECOG PERFORMANCE STATUS: 1  - Symptomatic but completely ambulatory  There were no vitals filed for this visit. There were no vitals filed for this visit.  GENERAL: alert, no distress and comfortable SKIN: skin color, texture, turgor are normal, no rashes or significant lesions EYES: normal, Conjunctiva are pink and non-injected, sclera clear OROPHARYNX: no exudate, no erythema and lips, buccal mucosa, and tongue normal  NECK: supple, thyroid normal size, non-tender, without nodularity LYMPH: no palpable lymphadenopathy in the cervical, axillary or inguinal LUNGS: clear to auscultation and percussion with normal breathing effort HEART: regular rate & rhythm and no murmurs and no lower extremity edema ABDOMEN: abdomen soft, non-tender and normal bowel sounds MUSCULOSKELETAL: no cyanosis of digits and no clubbing  NEURO: alert & oriented x 3 with fluent speech, no focal motor/sensory deficits EXTREMITIES: No lower extremity edema  LABORATORY DATA:  I have reviewed the data as listed CMP Latest Ref Rng & Units 09/07/2018 07/07/2018  Glucose 70 - 99 mg/dL 108(H) 126(H)  BUN 6 - 20 mg/dL 11 12  Creatinine 0.44 - 1.00 mg/dL 0.80 0.82  Sodium 135 - 145 mmol/L 140 138  Potassium 3.5 - 5.1 mmol/L 3.8 4.0  Chloride 98 - 111 mmol/L 107 107  CO2 22 - 32 mmol/L 24 23  Calcium 8.9 - 10.3 mg/dL 8.8(L) 8.7(L)  Total Protein 6.5 - 8.1 g/dL 7.1 7.1  Total Bilirubin 0.3 - 1.2 mg/dL 0.5 0.4  Alkaline Phos 38 - 126 U/L 170(H) 83  AST 15 - 41 U/L 73(H) 9(L)  ALT 0 - 44 U/L 118(H) 9    Lab Results  Component Value Date   WBC 2.4 (L) 09/14/2018   HGB 11.9 (L) 09/14/2018   HCT 36.5 09/14/2018   MCV 89.2 09/14/2018   PLT 144 (L) 09/14/2018   NEUTROABS PENDING 09/14/2018    ASSESSMENT & PLAN:  Malignant neoplasm of upper-inner quadrant of left breast in female, estrogen receptor positive ([place]) 07/05/2018:Screening mammogram detected bilateral breast masses. Diagnostic mammogram and US showed cysts in the right breast and a  1.2cm left breast mass at the 9 o'clock position. 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 Stage Ia Brief course of neoadjuvant tamoxifen therapy started 07/07/2018 08/04/2018: Left lumpectomy:Left lumpectomy [name]): IDC with intermediate grade DCIS, 1.2cm, grade 1, clear margins, with 3 axillary lymph nodes negative for carcinoma ER 100%, PR 60%, Ki-67 50%  Oncotype DX score: 32, distant recurrence at 9 years with hormone therapy alone: 20%  Treatment plan: 1.Adjuvant chemotherapy with dose dense Adriamycin and Cytoxan x4 followed by Taxol x12 2.Given radiation therapy followed by 3.Adjuvant antiestrogen therapy ----------------------------------------------------------------------------------------------------------------------------- Current treatment: Cycle 1 day 8 dose dense Adriamycin Cytoxan Chemo toxicities: 1.  Mild nausea 2.  Severe constipation: Encouraged to take stool softener 3 times a day and then use magnesium containing laxatives. 3.  Leukopenia and thrombocytopenia: As expected due to chemo.  Return to clinic in 1 week for cycle 2 of chemo.    No orders of the defined types were placed in this encounter.  The patient has a good understanding of the overall plan. she agrees with it. she will call with any problems that may develop before the next visit here.  [name], MD 09/14/2018  Julious Oka Dorshimer am acting as scribe for Dr. [name].  I have reviewed the above documentation for accuracy and completeness, and I agree with the above.

## 2018-09-14 ENCOUNTER — Other Ambulatory Visit: Payer: Self-pay

## 2018-09-14 ENCOUNTER — Inpatient Hospital Stay: Payer: BC Managed Care – PPO

## 2018-09-14 ENCOUNTER — Inpatient Hospital Stay (HOSPITAL_BASED_OUTPATIENT_CLINIC_OR_DEPARTMENT_OTHER): Payer: BC Managed Care – PPO | Admitting: Hematology and Oncology

## 2018-09-14 DIAGNOSIS — C50212 Malignant neoplasm of upper-inner quadrant of left female breast: Secondary | ICD-10-CM

## 2018-09-14 DIAGNOSIS — Z170 Estrogen receptor positive status [ER+]: Secondary | ICD-10-CM

## 2018-09-14 DIAGNOSIS — Z5111 Encounter for antineoplastic chemotherapy: Secondary | ICD-10-CM | POA: Diagnosis not present

## 2018-09-14 LAB — CBC WITH DIFFERENTIAL (CANCER CENTER ONLY)
Abs Immature Granulocytes: 0.02 10*3/uL (ref 0.00–0.07)
Basophils Absolute: 0 10*3/uL (ref 0.0–0.1)
Basophils Relative: 2 %
Eosinophils Absolute: 0.1 10*3/uL (ref 0.0–0.5)
Eosinophils Relative: 6 %
HCT: 36.5 % (ref 36.0–46.0)
Hemoglobin: 11.9 g/dL — ABNORMAL LOW (ref 12.0–15.0)
Immature Granulocytes: 1 %
Lymphocytes Relative: 42 %
Lymphs Abs: 1 10*3/uL (ref 0.7–4.0)
MCH: 29.1 pg (ref 26.0–34.0)
MCHC: 32.6 g/dL (ref 30.0–36.0)
MCV: 89.2 fL (ref 80.0–100.0)
Monocytes Absolute: 0.2 10*3/uL (ref 0.1–1.0)
Monocytes Relative: 8 %
Neutro Abs: 1 10*3/uL — ABNORMAL LOW (ref 1.7–7.7)
Neutrophils Relative %: 41 %
Platelet Count: 144 10*3/uL — ABNORMAL LOW (ref 150–400)
RBC: 4.09 MIL/uL (ref 3.87–5.11)
RDW: 15.6 % — ABNORMAL HIGH (ref 11.5–15.5)
WBC Count: 2.4 10*3/uL — ABNORMAL LOW (ref 4.0–10.5)
nRBC: 0 % (ref 0.0–0.2)

## 2018-09-14 LAB — CMP (CANCER CENTER ONLY)
ALT: 17 U/L (ref 0–44)
AST: 9 U/L — ABNORMAL LOW (ref 15–41)
Albumin: 3.4 g/dL — ABNORMAL LOW (ref 3.5–5.0)
Alkaline Phosphatase: 110 U/L (ref 38–126)
Anion gap: 8 (ref 5–15)
BUN: 11 mg/dL (ref 6–20)
CO2: 27 mmol/L (ref 22–32)
Calcium: 9 mg/dL (ref 8.9–10.3)
Chloride: 104 mmol/L (ref 98–111)
Creatinine: 0.79 mg/dL (ref 0.44–1.00)
GFR, Est AFR Am: >60 mL/min (ref >60)
GFR, Estimated: >60 mL/min (ref >60)
Glucose, Bld: 106 mg/dL — ABNORMAL HIGH (ref 70–99)
Potassium: 4.6 mmol/L (ref 3.5–5.1)
Sodium: 139 mmol/L (ref 135–145)
Total Bilirubin: 0.4 mg/dL (ref 0.3–1.2)
Total Protein: 7 g/dL (ref 6.5–8.1)

## 2018-09-14 NOTE — Assessment & Plan Note (Signed)
07/05/2018:Screening mammogram detected bilateral breast masses. Diagnostic mammogram and US showed cysts in the right breast and a 1.2cm left breast mass at the 9 o'clock position. 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 Stage Ia Brief course of neoadjuvant tamoxifen therapy started 07/07/2018 08/04/2018: Left lumpectomy:Left lumpectomy [name]): IDC with intermediate grade DCIS, 1.2cm, grade 1, clear margins, with 3 axillary lymph nodes negative for carcinoma ER 100%, PR 60%, Ki-67 50%  Oncotype DX score: 32, distant recurrence at 9 years with hormone therapy alone: 20%  Treatment plan: 1.Adjuvant chemotherapy with dose dense Adriamycin and Cytoxan x4 followed by Taxol x12 2.Given radiation therapy followed by 3.Adjuvant antiestrogen therapy ----------------------------------------------------------------------------------------------------------------------------- Current treatment: Cycle 1 day 8 dose dense Adriamycin Cytoxan Chemo toxicities:  Return to clinic in 1 week for cycle 2 of chemo.

## 2018-09-19 NOTE — Progress Notes (Signed)
Patient Care Team: Patient, [name] as PCP - General (General Practice) Caitlin Kaufmann, RN as Oncology Nurse Navigator Caitlin Germany, RN as Oncology Nurse Navigator Caitlin Lose, MD as Consulting Physician (Hematology and Oncology) Caitlin Kussmaul, MD as Consulting Physician (General Surgery) Caitlin Gibson, MD as Attending Physician (Radiation Oncology)  DIAGNOSIS:    ICD-10-CM   1. Malignant neoplasm of upper-inner quadrant of left breast in female, estrogen receptor positive (Caitlin Graham)  C50.212    Z17.0     SUMMARY OF ONCOLOGIC HISTORY: Oncology History  Malignant neoplasm of upper-inner quadrant of left breast in female, estrogen receptor positive (Caitlin Graham)  07/05/2018 Initial Diagnosis   Screening mammogram detected bilateral breast masses. Diagnostic mammogram and US showed cysts in the right breast and a 1.2cm left breast mass at the 9 o'clock position. 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    07/07/2018 Cancer Staging   Staging form: Breast, AJCC 8th Edition - Clinical stage from 07/07/2018: Stage IA (cT1c, cN0, cM0, G2, ER+, PR+, HER2-) - Signed by Caitlin Lose, MD on 07/07/2018    Genetic Testing   Negative testing. No pathogenic variants identified on the [facility] Common Hereditary Cancers Panel. The Common Hereditary Cancers Panel offered by [facility] includes sequencing and/or deletion duplication testing of the following 47 genes: APC, ATM, AXIN2, BARD1, BMPR1A, BRCA1, BRCA2, BRIP1, CDH1, CDKN2A (p14ARF), CDKN2A (p16INK4a), CKD4, CHEK2, CTNNA1, DICER1, EPCAM (Deletion/duplication testing only), GREM1 (promoter region deletion/duplication testing only), KIT, MEN1, MLH1, MSH2, MSH3, MSH6, MUTYH, NBN, NF1, NHTL1, PALB2, PDGFRA, PMS2, POLD1, POLE, PTEN, RAD50, RAD51C, RAD51D, SDHB, SDHC, SDHD, SMAD4, SMARCA4. STK11, TP53, TSC1, TSC2, and VHL.  The following genes were evaluated for sequence changes only: SDHA and HOXB13 c.251G>A variant only. The  report date is 07/18/2018.   08/04/2018 Surgery   Left lumpectomy Caitlin Graham): IDC with intermediate grade DCIS, 1.2cm, grade 1, clear margins, with 3 axillary lymph nodes negative for carcinoma ER 100%, PR 60%, Ki-67 50%   09/07/2018 -  Chemotherapy   The patient had DOXOrubicin (ADRIAMYCIN) chemo injection 146 mg, 60 mg/m2 = 146 mg, Intravenous,  Once, 1 of 4 cycles Administration: 146 mg (09/07/2018) palonosetron (ALOXI) injection 0.25 mg, 0.25 mg, Intravenous,  Once, 1 of 4 cycles Administration: 0.25 mg (09/07/2018) pegfilgrastim-jmdb (FULPHILA) injection 6 mg, 6 mg, Subcutaneous,  Once, 1 of 4 cycles Administration: 6 mg (09/09/2018) cyclophosphamide (CYTOXAN) 1,460 mg in sodium chloride 0.9 % 250 mL chemo infusion, 600 mg/m2 = 1,460 mg, Intravenous,  Once, 1 of 4 cycles Administration: 1,460 mg (09/07/2018) PACLitaxel (TAXOL) 198 mg in sodium chloride 0.9 % 250 mL chemo infusion (</= 76m/m2), 80 mg/m2 = 198 mg, Intravenous,  Once, 0 of 12 cycles fosaprepitant (EMEND) 150 mg, dexamethasone (DECADRON) 12 mg in sodium chloride 0.9 % 145 mL IVPB, , Intravenous,  Once, 1 of 4 cycles Administration:  (09/07/2018)  for chemotherapy treatment.      CHIEF COMPLIANT: Cycle 2Adriamycin and Cytoxan  INTERVAL HISTORY: Caitlin Lettermanis a 53y.o. with above-mentioned history of left breast cancer who underwent a lumpectomy and is currently on adjuvant chemotherapy treatment withdose dense Adriamycin and Cytoxan.She presents to the clinic todayfor cycle 2.  After cycle 1 she experienced severe constipation which got better with supportive therapy She experienced severe sternal and back and pelvic pain on day 9 and 10.  It was excruciating and she had to take her tramadol and 2 Tylenols every 6 hours to get relief from the  pain.  It finally subsided. Constipation is much better this past week.  REVIEW OF SYSTEMS:   Constitutional: Denies fevers, chills or abnormal weight loss Eyes: Denies blurriness of vision  Ears, nose, mouth, throat, and face: Denies mucositis or sore throat Respiratory: Denies cough, dyspnea or wheezes Cardiovascular: Denies palpitation, chest discomfort Gastrointestinal: Denies nausea, heartburn or change in bowel habits Skin: Denies abnormal skin rashes Lymphatics: Denies new lymphadenopathy or easy bruising Neurological: Denies numbness, tingling or new weaknesses Behavioral/Psych: Mood is stable, no new changes  Extremities: No lower extremity edema Breast: denies any pain or lumps or nodules in either breasts All other systems were reviewed with the patient and are negative.  I have reviewed the past medical history, past surgical history, social history and family history with the patient and they are unchanged from previous note.  ALLERGIES:  is allergic to aspirin; ibuprofen; hydrocodone; oxycodone; and phenergan [promethazine].  MEDICATIONS:  Current Outpatient Medications  Medication Sig Dispense Refill  . acetaminophen (TYLENOL) 325 MG tablet Take 650 mg by mouth every 6 (six) hours as needed.    Marland Kitchen dexamethasone (DECADRON) 4 MG tablet Take 1 tablet day after chemo and 1 tablet 2 days after chemo with food 8 tablet 0  . lidocaine-prilocaine (EMLA) cream Apply to affected area once 30 g 3  . LORazepam (ATIVAN) 0.5 MG tablet Take 1 tablet (0.5 mg total) by mouth at bedtime as needed for sleep. 30 tablet 0  . ondansetron (ZOFRAN) 8 MG tablet Take 1 tablet (8 mg total) by mouth 2 (two) times daily as needed. Start on the third day after chemotherapy. 30 tablet 1  . prochlorperazine (COMPAZINE) 10 MG tablet Take 1 tablet (10 mg total) by mouth every 6 (six) hours as needed (Nausea or vomiting). 30 tablet 1  . traMADol (ULTRAM) 50 MG tablet Take 1 tablet (50 mg total) by mouth every 6 (six) hours as needed. 60 tablet 0   No current facility-administered medications for this visit.     PHYSICAL EXAMINATION: ECOG PERFORMANCE STATUS: 1 - Symptomatic but completely  ambulatory  Vitals:   09/20/18 0832  BP: 118/70  Pulse: 74  Resp: 17  Temp: 98.7 F (37.1 C)  SpO2: 98%   Filed Weights   09/20/18 0832  Weight: 286 lb 11.2 oz (130 kg)    GENERAL: alert, no distress and comfortable SKIN: skin color, texture, turgor are normal, no rashes or significant lesions EYES: normal, Conjunctiva are pink and non-injected, sclera clear  OROPHARYNX: no exudate, no erythema and lips, buccal mucosa, and tongue normal  NECK: supple, thyroid normal size, non-tender, without nodularity LYMPH: no palpable lymphadenopathy in the cervical, axillary or inguinal LUNGS: clear to auscultation and percussion with normal breathing effort HEART: regular rate & rhythm and no murmurs and no lower extremity edema ABDOMEN: abdomen soft, non-tender and normal bowel sounds MUSCULOSKELETAL: no cyanosis of digits and no clubbing  NEURO: alert & oriented x 3 with fluent speech, no focal motor/sensory deficits EXTREMITIES: No lower extremity edema  LABORATORY DATA:  I have reviewed the data as listed CMP Latest Ref Rng & Units 09/14/2018 09/07/2018 07/07/2018  Glucose 70 - 99 mg/dL 106(H) 108(H) 126(H)  BUN 6 - 20 mg/dL _0 Creatinine 0.44 - 1.00 mg/dL 0.79 0.80 0.82  Sodium 135 - 145 mmol/L 139 140 138  Potassium 3.5 - 5.1 mmol/L 4.6 3.8 4.0  Chloride 98 - 111 mmol/L 104 107 107  CO2 22 - 32 mmol/L 27 24 23  Calcium 8.9 - 10.3 mg/dL 9.0 8.8(L) 8.7(L)  Total Protein 6.5 - 8.1 g/dL 7.0 7.1 7.1  Total Bilirubin 0.3 - 1.2 mg/dL 0.4 0.5 0.4  Alkaline Phos 38 - 126 U/L 110 170(H) 83  AST 15 - 41 U/L 9(L) 73(H) 9(L)  ALT 0 - 44 U/L 17 118(H) 9    Lab Results  Component Value Date   WBC 11.9 (H) 09/20/2018   HGB 10.4 (L) 09/20/2018   HCT 32.6 (L) 09/20/2018   MCV 91.3 09/20/2018   PLT 160 09/20/2018   NEUTROABS PENDING 09/20/2018    ASSESSMENT & PLAN:  Malignant neoplasm of upper-inner quadrant of left breast in female, estrogen receptor positive ([place])  07/05/2018:Screening mammogram detected bilateral breast masses. Diagnostic mammogram and US showed cysts in the right breast and a 1.2cm left breast mass at the 9 o'clock position. 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 Stage Ia Brief course of neoadjuvant tamoxifen therapy started 07/07/2018 08/04/2018: Left lumpectomy:Left lumpectomy Caitlin Graham): IDC with intermediate grade DCIS, 1.2cm, grade 1, clear margins, with 3 axillary lymph nodes negative for carcinoma ER 100%, PR 60%, Ki-67 50%  Oncotype DX score: 32, distant recurrence at 9 years with hormone therapy alone: 20%  Treatment plan: 1.Adjuvant chemotherapy with dose dense Adriamycin and Cytoxan x4 followed by Taxol x12 2.Given radiation therapy followed by 3.Adjuvant antiestrogen therapy ----------------------------------------------------------------------------------------------------------------------------- Current treatment: Cycle 2 dose dense Adriamycin Cytoxan Chemo toxicities: 1.  Mild nausea 2.  Severe constipation: Encouraged to take stool softener 3 times a day and then use magnesium containing laxatives. 3.    Chemotherapy-induced anemia: We will monitoring it closely.  I discussed with her about increasing iron-containing foods. 4.  Weight fluctuations: Possibly due to diet changes.  I instructed her to cut down the carbohydrates. 5.  Severe bone pain especially will start him back and pelvis: Due to G-CSF injection.  I gave her another prescription for Ultram.  Ultram and Tylenol appears to be working for her.  Return to clinic in 2 weeks for cycle 3.  No orders of the defined types were placed in this encounter.  The patient has a good understanding of the overall plan. she agrees with it. she will call with any problems that may develop before the next visit here.  Caitlin Lose, MD 09/20/2018  Julious Oka Dorshimer am acting as scribe for Dr. Nicholas Graham.  I have reviewed  the above documentation for accuracy and completeness, and I agree with the above.

## 2018-09-20 ENCOUNTER — Inpatient Hospital Stay: Payer: BC Managed Care – PPO

## 2018-09-20 ENCOUNTER — Encounter: Payer: Self-pay | Admitting: *Deleted

## 2018-09-20 ENCOUNTER — Other Ambulatory Visit: Payer: Self-pay

## 2018-09-20 ENCOUNTER — Inpatient Hospital Stay (HOSPITAL_BASED_OUTPATIENT_CLINIC_OR_DEPARTMENT_OTHER): Payer: BC Managed Care – PPO | Admitting: Hematology and Oncology

## 2018-09-20 DIAGNOSIS — Z95828 Presence of other vascular implants and grafts: Secondary | ICD-10-CM

## 2018-09-20 DIAGNOSIS — Z170 Estrogen receptor positive status [ER+]: Secondary | ICD-10-CM

## 2018-09-20 DIAGNOSIS — C50212 Malignant neoplasm of upper-inner quadrant of left female breast: Secondary | ICD-10-CM

## 2018-09-20 DIAGNOSIS — Z5111 Encounter for antineoplastic chemotherapy: Secondary | ICD-10-CM | POA: Diagnosis not present

## 2018-09-20 LAB — CBC WITH DIFFERENTIAL (CANCER CENTER ONLY)
Abs Immature Granulocytes: 2.65 10*3/uL — ABNORMAL HIGH (ref 0.00–0.07)
Basophils Absolute: 0.2 10*3/uL — ABNORMAL HIGH (ref 0.0–0.1)
Basophils Relative: 2 %
Eosinophils Absolute: 0.1 10*3/uL (ref 0.0–0.5)
Eosinophils Relative: 1 %
HCT: 32.6 % — ABNORMAL LOW (ref 36.0–46.0)
Hemoglobin: 10.4 g/dL — ABNORMAL LOW (ref 12.0–15.0)
Immature Granulocytes: 22 %
Lymphocytes Relative: 15 %
Lymphs Abs: 1.7 10*3/uL (ref 0.7–4.0)
MCH: 29.1 pg (ref 26.0–34.0)
MCHC: 31.9 g/dL (ref 30.0–36.0)
MCV: 91.3 fL (ref 80.0–100.0)
Monocytes Absolute: 1.3 10*3/uL — ABNORMAL HIGH (ref 0.1–1.0)
Monocytes Relative: 11 %
Neutro Abs: 5.9 10*3/uL (ref 1.7–7.7)
Neutrophils Relative %: 49 %
Platelet Count: 160 10*3/uL (ref 150–400)
RBC: 3.57 MIL/uL — ABNORMAL LOW (ref 3.87–5.11)
RDW: 16.4 % — ABNORMAL HIGH (ref 11.5–15.5)
WBC Count: 11.9 10*3/uL — ABNORMAL HIGH (ref 4.0–10.5)
nRBC: 0.9 % — ABNORMAL HIGH (ref 0.0–0.2)

## 2018-09-20 LAB — CMP (CANCER CENTER ONLY)
ALT: 46 U/L — ABNORMAL HIGH (ref 0–44)
AST: 16 U/L (ref 15–41)
Albumin: 3.3 g/dL — ABNORMAL LOW (ref 3.5–5.0)
Alkaline Phosphatase: 119 U/L (ref 38–126)
Anion gap: 10 (ref 5–15)
BUN: 14 mg/dL (ref 6–20)
CO2: 24 mmol/L (ref 22–32)
Calcium: 8.7 mg/dL — ABNORMAL LOW (ref 8.9–10.3)
Chloride: 108 mmol/L (ref 98–111)
Creatinine: 0.71 mg/dL (ref 0.44–1.00)
GFR, Est AFR Am: >60 mL/min (ref >60)
GFR, Estimated: >60 mL/min (ref >60)
Glucose, Bld: 108 mg/dL — ABNORMAL HIGH (ref 70–99)
Potassium: 4 mmol/L (ref 3.5–5.1)
Sodium: 142 mmol/L (ref 135–145)
Total Bilirubin: 0.2 mg/dL — ABNORMAL LOW (ref 0.3–1.2)
Total Protein: 6.3 g/dL — ABNORMAL LOW (ref 6.5–8.1)

## 2018-09-20 MED ORDER — HEPARIN SOD (PORK) LOCK FLUSH 100 UNIT/ML IV SOLN
500.0000 [IU] | Freq: Once | INTRAVENOUS | Status: AC | PRN
  Administered 2018-09-20: 500 [IU]
  Filled 2018-09-20: qty 5

## 2018-09-20 MED ORDER — SODIUM CHLORIDE 0.9 % IV SOLN
Freq: Once | INTRAVENOUS | Status: AC
  Administered 2018-09-20: 10:00:00 via INTRAVENOUS
  Filled 2018-09-20: qty 5

## 2018-09-20 MED ORDER — SODIUM CHLORIDE 0.9% FLUSH
10.0000 mL | INTRAVENOUS | Status: DC | PRN
  Administered 2018-09-20: 12:00:00 10 mL
  Filled 2018-09-20: qty 10

## 2018-09-20 MED ORDER — TRAMADOL HCL 50 MG PO TABS: 50.0000 mg | ORAL_TABLET | Freq: Four times a day (QID) | ORAL | 0 refills | Status: DC | PRN

## 2018-09-20 MED ORDER — SODIUM CHLORIDE 0.9% FLUSH
10.0000 mL | INTRAVENOUS | Status: DC | PRN
  Administered 2018-09-20: 10 mL via INTRAVENOUS
  Filled 2018-09-20: qty 10

## 2018-09-20 MED ORDER — DOXORUBICIN HCL CHEMO IV INJECTION 2 MG/ML
60.0000 mg/m2 | Freq: Once | INTRAVENOUS | Status: AC
  Administered 2018-09-20: 146 mg via INTRAVENOUS
  Filled 2018-09-20: qty 73

## 2018-09-20 MED ORDER — SODIUM CHLORIDE 0.9 % IV SOLN
600.0000 mg/m2 | Freq: Once | INTRAVENOUS | Status: AC
  Administered 2018-09-20: 1460 mg via INTRAVENOUS
  Filled 2018-09-20: qty 73

## 2018-09-20 MED ORDER — PALONOSETRON HCL INJECTION 0.25 MG/5ML
INTRAVENOUS | Status: AC
  Filled 2018-09-20: qty 5

## 2018-09-20 MED ORDER — PALONOSETRON HCL INJECTION 0.25 MG/5ML
0.2500 mg | Freq: Once | INTRAVENOUS | Status: AC
  Administered 2018-09-20: 0.25 mg via INTRAVENOUS

## 2018-09-20 MED ORDER — SODIUM CHLORIDE 0.9 % IV SOLN
Freq: Once | INTRAVENOUS | Status: AC
  Administered 2018-09-20: 10:00:00 via INTRAVENOUS
  Filled 2018-09-20: qty 250

## 2018-09-20 NOTE — Patient Instructions (Signed)
[facility] Discharge Instructions for Patients Receiving Chemotherapy  Today you received the following chemotherapy agents: Adriamycin and Cytoxan.  To help prevent nausea and vomiting after your treatment, we encourage you to take your nausea medication as directed. No Zofran for three days. Take Compazine instead.   If you develop nausea and vomiting that is not controlled by your nausea medication, call the clinic.   BELOW ARE SYMPTOMS THAT SHOULD BE REPORTED IMMEDIATELY:  *FEVER GREATER THAN 100.5 F  *CHILLS WITH OR WITHOUT FEVER  NAUSEA AND VOMITING THAT IS NOT CONTROLLED WITH YOUR NAUSEA MEDICATION  *UNUSUAL SHORTNESS OF BREATH  *UNUSUAL BRUISING OR BLEEDING  TENDERNESS IN MOUTH AND THROAT WITH OR WITHOUT PRESENCE OF ULCERS  *URINARY PROBLEMS  *BOWEL PROBLEMS  UNUSUAL RASH Items with * indicate a potential emergency and should be followed up as soon as possible.  Feel free to call the clinic should you have any questions or concerns. The clinic phone number is [phone].  Please show the [facility] at check-in to the Emergency Department and triage nurse.

## 2018-09-20 NOTE — Assessment & Plan Note (Signed)
07/05/2018:Screening mammogram detected bilateral breast masses. Diagnostic mammogram and US showed cysts in the right breast and a 1.2cm left breast mass at the 9 o'clock position. 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 Stage Ia Brief course of neoadjuvant tamoxifen therapy started 07/07/2018 08/04/2018: Left lumpectomy:Left lumpectomy [name]): IDC with intermediate grade DCIS, 1.2cm, grade 1, clear margins, with 3 axillary lymph nodes negative for carcinoma ER 100%, PR 60%, Ki-67 50%  Oncotype DX score: 32, distant recurrence at 9 years with hormone therapy alone: 20%  Treatment plan: 1.Adjuvant chemotherapy with dose dense Adriamycin and Cytoxan x4 followed by Taxol x12 2.Given radiation therapy followed by 3.Adjuvant antiestrogen therapy ----------------------------------------------------------------------------------------------------------------------------- Current treatment: Cycle 2 dose dense Adriamycin Cytoxan Chemo toxicities: 1.  Mild nausea 2.  Severe constipation: Encouraged to take stool softener 3 times a day and then use magnesium containing laxatives. 3.  Leukopenia and thrombocytopenia: As expected due to chemo.  Return to clinic in 2 weeks for cycle 3.

## 2018-09-21 ENCOUNTER — Other Ambulatory Visit: Payer: BC Managed Care – PPO

## 2018-09-21 ENCOUNTER — Ambulatory Visit: Payer: BC Managed Care – PPO

## 2018-09-21 ENCOUNTER — Ambulatory Visit: Payer: BC Managed Care – PPO | Admitting: Hematology and Oncology

## 2018-09-22 ENCOUNTER — Inpatient Hospital Stay: Payer: BC Managed Care – PPO

## 2018-09-22 ENCOUNTER — Other Ambulatory Visit: Payer: Self-pay

## 2018-09-22 VITALS — BP 103/45 | HR 62 | Temp 98.0°F | Resp 16

## 2018-09-22 DIAGNOSIS — Z5111 Encounter for antineoplastic chemotherapy: Secondary | ICD-10-CM | POA: Diagnosis not present

## 2018-09-22 DIAGNOSIS — Z170 Estrogen receptor positive status [ER+]: Secondary | ICD-10-CM

## 2018-09-22 DIAGNOSIS — C50212 Malignant neoplasm of upper-inner quadrant of left female breast: Secondary | ICD-10-CM

## 2018-09-22 MED ORDER — PEGFILGRASTIM-JMDB 6 MG/0.6ML ~~LOC~~ SOSY
PREFILLED_SYRINGE | SUBCUTANEOUS | Status: AC
  Filled 2018-09-22: qty 0.6

## 2018-09-22 MED ORDER — PEGFILGRASTIM-JMDB 6 MG/0.6ML ~~LOC~~ SOSY
6.0000 mg | PREFILLED_SYRINGE | Freq: Once | SUBCUTANEOUS | Status: AC
  Administered 2018-09-22: 13:00:00 6 mg via SUBCUTANEOUS

## 2018-09-22 NOTE — Patient Instructions (Signed)

## 2018-09-23 ENCOUNTER — Ambulatory Visit: Payer: BC Managed Care – PPO

## 2018-10-01 ENCOUNTER — Encounter: Payer: Self-pay | Admitting: *Deleted

## 2018-10-03 NOTE — Progress Notes (Signed)
° °Patient Care Team: °Patient, [name] as PCP - General (General Practice) °Stuart, Dawn C, RN as Oncology Nurse Navigator °Martini, Keisha N, RN as Oncology Nurse Navigator °Gudena, Vinay, MD as Consulting Physician (Hematology and Oncology) °Toth, Paul III, MD as Consulting Physician (General Surgery) °Squire, Sarah, MD as Attending Physician (Radiation Oncology) ° °DIAGNOSIS:  °  ICD-10-CM   °1. Malignant neoplasm of upper-inner quadrant of left breast in female, estrogen receptor positive (HCC)  C50.212   ° Z17.0   ° ° °SUMMARY OF ONCOLOGIC HISTORY: °Oncology History  °Malignant neoplasm of upper-inner quadrant of left breast in female, estrogen receptor positive (HCC)  °07/05/2018 Initial Diagnosis  ° Screening mammogram detected bilateral breast masses. Diagnostic mammogram and US showed cysts in the right breast and a 1.2cm left breast mass at the 9 o'clock position. 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  °  °07/07/2018 Cancer Staging  ° Staging form: Breast, AJCC 8th Edition °- Clinical stage from 07/07/2018: Stage IA (cT1c, cN0, cM0, G2, ER+, PR+, HER2-) - Signed by Gudena, Vinay, MD on 07/07/2018 °  ° Genetic Testing  ° Negative testing. No pathogenic variants identified on the [facility] Common Hereditary Cancers Panel. The Common Hereditary Cancers Panel offered by [facility] includes sequencing and/or deletion duplication testing of the following 47 genes: APC, ATM, AXIN2, BARD1, BMPR1A, BRCA1, BRCA2, BRIP1, CDH1, CDKN2A (p14ARF), CDKN2A (p16INK4a), CKD4, CHEK2, CTNNA1, DICER1, EPCAM (Deletion/duplication testing only), GREM1 (promoter region deletion/duplication testing only), KIT, MEN1, MLH1, MSH2, MSH3, MSH6, MUTYH, NBN, NF1, NHTL1, PALB2, PDGFRA, PMS2, POLD1, POLE, PTEN, RAD50, RAD51C, RAD51D, SDHB, SDHC, SDHD, SMAD4, SMARCA4. STK11, TP53, TSC1, TSC2, and VHL.  The following genes were evaluated for sequence changes only: SDHA and HOXB13 c.251G>A variant only. The  report date is 07/18/2018. °  °08/04/2018 Surgery  ° Left lumpectomy (Toth): IDC with intermediate grade DCIS, 1.2cm, grade 1, clear margins, with 3 axillary lymph nodes negative for carcinoma ER 100%, PR 60%, Ki-67 50% °  °09/07/2018 -  Chemotherapy  ° The patient had DOXOrubicin (ADRIAMYCIN) chemo injection 146 mg, 60 mg/m2 = 146 mg, Intravenous,  Once, 2 of 4 cycles °Administration: 146 mg (09/07/2018), 146 mg (09/20/2018) °palonosetron (ALOXI) injection 0.25 mg, 0.25 mg, Intravenous,  Once, 2 of 4 cycles °Administration: 0.25 mg (09/07/2018), 0.25 mg (09/20/2018) °pegfilgrastim-jmdb (FULPHILA) injection 6 mg, 6 mg, Subcutaneous,  Once, 2 of 4 cycles °Administration: 6 mg (09/09/2018), 6 mg (09/22/2018) °cyclophosphamide (CYTOXAN) 1,460 mg in sodium chloride 0.9 % 250 mL chemo infusion, 600 mg/m2 = 1,460 mg, Intravenous,  Once, 2 of 4 cycles °Administration: 1,460 mg (09/07/2018), 1,460 mg (09/20/2018) °PACLitaxel (TAXOL) 198 mg in sodium chloride 0.9 % 250 mL chemo infusion (</= 80mg/m2), 80 mg/m2 = 198 mg, Intravenous,  Once, 0 of 12 cycles °fosaprepitant (EMEND) 150 mg, dexamethasone (DECADRON) 12 mg in sodium chloride 0.9 % 145 mL IVPB, , Intravenous,  Once, 2 of 4 cycles °Administration:  (09/07/2018),  (09/20/2018) ° for chemotherapy treatment.  °  ° ° °CHIEF COMPLIANT: Cycle 3 Adriamycin and Cytoxan ° °INTERVAL HISTORY: Caitlin Graham is a 53 y.o. with above-mentioned history of left breast cancer who underwent a lumpectomy and is currently on adjuvant chemotherapy treatment with dose dense Adriamycin and Cytoxan. She presents to the clinic today for cycle 3.  Nurses inform me that the port appears to have some swelling but there is no redness.  She developed acneform rash on the face.  She had fatigue this past week.  She was also very emotional and   and crying because her hair fell out completely.  REVIEW OF SYSTEMS:   Constitutional: Fatigue, mild nausea, Eyes: Denies blurriness of vision Ears, nose, mouth, throat, and face:  Denies mucositis or sore throat Respiratory: Denies cough, dyspnea or wheezes Cardiovascular: Denies palpitation, chest discomfort Gastrointestinal: Mild nausea Skin: Acneform reaction on the face Lymphatics: Denies new lymphadenopathy or easy bruising Neurological: Denies numbness, tingling or new weaknesses Behavioral/Psych: Mood is stable, no new changes  Extremities: No lower extremity edema Breast: denies any pain or lumps or nodules in either breasts All other systems were reviewed with the patient and are negative.  I have reviewed the past medical history, past surgical history, social history and family history with the patient and they are unchanged from previous note.  ALLERGIES:  is allergic to aspirin; ibuprofen; hydrocodone; oxycodone; and phenergan [promethazine].  MEDICATIONS:  Current Outpatient Medications  Medication Sig Dispense Refill   acetaminophen (TYLENOL) 325 MG tablet Take 650 mg by mouth every 6 (six) hours as needed.     dexamethasone (DECADRON) 4 MG tablet Take 1 tablet day after chemo and 1 tablet 2 days after chemo with food 8 tablet 0   lidocaine-prilocaine (EMLA) cream Apply to affected area once 30 g 3   LORazepam (ATIVAN) 0.5 MG tablet Take 1 tablet (0.5 mg total) by mouth at bedtime as needed for sleep. 30 tablet 0   ondansetron (ZOFRAN) 8 MG tablet Take 1 tablet (8 mg total) by mouth 2 (two) times daily as needed. Start on the third day after chemotherapy. 30 tablet 1   prochlorperazine (COMPAZINE) 10 MG tablet Take 1 tablet (10 mg total) by mouth every 6 (six) hours as needed (Nausea or vomiting). 30 tablet 1   traMADol (ULTRAM) 50 MG tablet Take 1 tablet (50 mg total) by mouth every 6 (six) hours as needed. 60 tablet 0   No current facility-administered medications for this visit.     PHYSICAL EXAMINATION: ECOG PERFORMANCE STATUS: 1 - Symptomatic but completely ambulatory  There were no vitals filed for this visit. There were no vitals  filed for this visit.  GENERAL: alert, no distress and comfortable SKIN: skin color, texture, turgor are normal, no rashes or significant lesions EYES: normal, Conjunctiva are pink and non-injected, sclera clear OROPHARYNX: no exudate, no erythema and lips, buccal mucosa, and tongue normal  NECK: supple, thyroid normal size, non-tender, without nodularity LYMPH: no palpable lymphadenopathy in the cervical, axillary or inguinal LUNGS: clear to auscultation and percussion with normal breathing effort HEART: regular rate & rhythm and no murmurs and no lower extremity edema ABDOMEN: abdomen soft, non-tender and normal bowel sounds MUSCULOSKELETAL: no cyanosis of digits and no clubbing  NEURO: alert & oriented x 3 with fluent speech, no focal motor/sensory deficits EXTREMITIES: No lower extremity edema  LABORATORY DATA:  I have reviewed the data as listed CMP Latest Ref Rng & Units 09/20/2018 09/14/2018 09/07/2018  Glucose 70 - 99 mg/dL 108(H) 106(H) 108(H)  BUN 6 - 20 mg/dL _0 Creatinine 0.44 - 1.00 mg/dL 0.71 0.79 0.80  Sodium 135 - 145 mmol/L 142 139 140  Potassium 3.5 - 5.1 mmol/L 4.0 4.6 3.8  Chloride 98 - 111 mmol/L 108 104 107  CO2 22 - 32 mmol/L _1 Calcium 8.9 - 10.3 mg/dL 8.7(L) 9.0 8.8(L)  Total Protein 6.5 - 8.1 g/dL 6.3(L) 7.0 7.1  Total Bilirubin 0.3 - 1.2 mg/dL 0.2(L) 0.4 0.5  Alkaline Phos 38 - 126 U/L 119 110 170(H)  AST 15 - 41 U/L 16 9(L) 73(H)  ALT 0 - 44 U/L 46(H) 17 118(H)    Lab Results  Component Value Date   WBC 13.4 (H) 10/04/2018   HGB 10.8 (L) 10/04/2018   HCT 34.3 (L) 10/04/2018   MCV 93.5 10/04/2018   PLT 175 10/04/2018   NEUTROABS PENDING 10/04/2018    ASSESSMENT & PLAN:  Malignant neoplasm of upper-inner quadrant of left breast in female, estrogen receptor positive ([place]) 07/05/2018:Screening mammogram detected bilateral breast masses. Diagnostic mammogram and US showed cysts in the right breast and a 1.2cm left breast mass at the 9  o'clock position. 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 Stage Ia Brief course of neoadjuvant tamoxifen therapy started 07/07/2018 08/04/2018: Left lumpectomy:Left lumpectomy [name]): IDC with intermediate grade DCIS, 1.2cm, grade 1, clear margins, with 3 axillary lymph nodes negative for carcinoma ER 100%, PR 60%, Ki-67 50%  Oncotype DX score: 32, distant recurrence at 9 years with hormone therapy alone: 20%  Treatment plan: 1.Adjuvant chemotherapy with dose dense Adriamycin and Cytoxan x4 followed by Taxol x12 2.Given radiation therapy followed by 3.Adjuvant antiestrogen therapy ----------------------------------------------------------------------------------------------------------------------------- Current treatment: Cycle 3dose dense Adriamycin Cytoxan Chemo toxicities: 1.Mild nausea 2.Severe constipation: Encouraged to take stool softener 3 times a day and then use magnesium containing laxatives. 3.  Chemotherapy-induced anemia: We will monitoring it closely.  I discussed with her about increasing iron-containing foods. 4.  Weight fluctuations: Possibly due to diet changes.  I instructed her to cut down the carbohydrates. 5.  Severe bone pain especially will start him back and pelvis: Due to G-CSF injection. Ultram and Tylenol appears to be working for her. 6.  Fatigue 7.  Mild nausea Return to clinic in 2 weeks for cycle 4.    No orders of the defined types were placed in this encounter.  The patient has a good understanding of the overall plan. she agrees with it. she will call with any problems that may develop before the next visit here.  [name], MD 10/04/2018  Julious Oka Dorshimer am acting as scribe for Dr. [name].  I have reviewed the above documentation for accuracy and completeness, and I agree with the above.

## 2018-10-04 ENCOUNTER — Inpatient Hospital Stay (HOSPITAL_BASED_OUTPATIENT_CLINIC_OR_DEPARTMENT_OTHER): Payer: BC Managed Care – PPO | Admitting: Medical

## 2018-10-04 ENCOUNTER — Other Ambulatory Visit: Payer: Self-pay

## 2018-10-04 ENCOUNTER — Inpatient Hospital Stay: Payer: BC Managed Care – PPO

## 2018-10-04 ENCOUNTER — Encounter: Payer: Self-pay | Admitting: *Deleted

## 2018-10-04 ENCOUNTER — Inpatient Hospital Stay (HOSPITAL_BASED_OUTPATIENT_CLINIC_OR_DEPARTMENT_OTHER): Payer: BC Managed Care – PPO | Admitting: Hematology and Oncology

## 2018-10-04 VITALS — BP 112/64 | HR 64

## 2018-10-04 DIAGNOSIS — Z5111 Encounter for antineoplastic chemotherapy: Secondary | ICD-10-CM | POA: Diagnosis not present

## 2018-10-04 DIAGNOSIS — C50212 Malignant neoplasm of upper-inner quadrant of left female breast: Secondary | ICD-10-CM | POA: Diagnosis not present

## 2018-10-04 DIAGNOSIS — Z170 Estrogen receptor positive status [ER+]: Secondary | ICD-10-CM

## 2018-10-04 DIAGNOSIS — R110 Nausea: Secondary | ICD-10-CM

## 2018-10-04 DIAGNOSIS — Z23 Encounter for immunization: Secondary | ICD-10-CM

## 2018-10-04 LAB — CMP (CANCER CENTER ONLY)
ALT: 18 U/L (ref 0–44)
AST: 12 U/L — ABNORMAL LOW (ref 15–41)
Albumin: 3.5 g/dL (ref 3.5–5.0)
Alkaline Phosphatase: 112 U/L (ref 38–126)
Anion gap: 9 (ref 5–15)
BUN: 13 mg/dL (ref 6–20)
CO2: 25 mmol/L (ref 22–32)
Calcium: 8.8 mg/dL — ABNORMAL LOW (ref 8.9–10.3)
Chloride: 107 mmol/L (ref 98–111)
Creatinine: 0.7 mg/dL (ref 0.44–1.00)
GFR, Est AFR Am: >60 mL/min (ref >60)
GFR, Estimated: >60 mL/min (ref >60)
Glucose, Bld: 102 mg/dL — ABNORMAL HIGH (ref 70–99)
Potassium: 4.1 mmol/L (ref 3.5–5.1)
Sodium: 141 mmol/L (ref 135–145)
Total Bilirubin: 0.3 mg/dL (ref 0.3–1.2)
Total Protein: 6.5 g/dL (ref 6.5–8.1)

## 2018-10-04 LAB — CBC WITH DIFFERENTIAL (CANCER CENTER ONLY)
Abs Immature Granulocytes: 2.79 10*3/uL — ABNORMAL HIGH (ref 0.00–0.07)
Basophils Absolute: 0.2 10*3/uL — ABNORMAL HIGH (ref 0.0–0.1)
Basophils Relative: 1 %
Eosinophils Absolute: 0 10*3/uL (ref 0.0–0.5)
Eosinophils Relative: 0 %
HCT: 34.3 % — ABNORMAL LOW (ref 36.0–46.0)
Hemoglobin: 10.8 g/dL — ABNORMAL LOW (ref 12.0–15.0)
Immature Granulocytes: 21 %
Lymphocytes Relative: 12 %
Lymphs Abs: 1.6 10*3/uL (ref 0.7–4.0)
MCH: 29.4 pg (ref 26.0–34.0)
MCHC: 31.5 g/dL (ref 30.0–36.0)
MCV: 93.5 fL (ref 80.0–100.0)
Monocytes Absolute: 1.5 10*3/uL — ABNORMAL HIGH (ref 0.1–1.0)
Monocytes Relative: 12 %
Neutro Abs: 7.3 10*3/uL (ref 1.7–7.7)
Neutrophils Relative %: 54 %
Platelet Count: 175 10*3/uL (ref 150–400)
RBC: 3.67 MIL/uL — ABNORMAL LOW (ref 3.87–5.11)
RDW: 18.3 % — ABNORMAL HIGH (ref 11.5–15.5)
WBC Count: 13.4 10*3/uL — ABNORMAL HIGH (ref 4.0–10.5)
nRBC: 0.5 % — ABNORMAL HIGH (ref 0.0–0.2)

## 2018-10-04 MED ORDER — DOXORUBICIN HCL CHEMO IV INJECTION 2 MG/ML
60.0000 mg/m2 | Freq: Once | INTRAVENOUS | Status: AC
  Administered 2018-10-04: 146 mg via INTRAVENOUS
  Filled 2018-10-04: qty 73

## 2018-10-04 MED ORDER — SODIUM CHLORIDE 0.9 % IV SOLN
Freq: Once | INTRAVENOUS | Status: AC
  Administered 2018-10-04: 10:00:00 via INTRAVENOUS
  Filled 2018-10-04: qty 250

## 2018-10-04 MED ORDER — SODIUM CHLORIDE 0.9% FLUSH
10.0000 mL | INTRAVENOUS | Status: DC | PRN
  Administered 2018-10-04: 13:00:00 10 mL
  Filled 2018-10-04: qty 10

## 2018-10-04 MED ORDER — PALONOSETRON HCL INJECTION 0.25 MG/5ML
0.2500 mg | Freq: Once | INTRAVENOUS | Status: AC
  Administered 2018-10-04: 0.25 mg via INTRAVENOUS

## 2018-10-04 MED ORDER — INFLUENZA VAC SPLIT QUAD 0.5 ML IM SUSY
PREFILLED_SYRINGE | INTRAMUSCULAR | Status: AC
  Filled 2018-10-04: qty 0.5

## 2018-10-04 MED ORDER — SODIUM CHLORIDE 0.9 % IV SOLN
Freq: Once | INTRAVENOUS | Status: AC
  Administered 2018-10-04: 10:00:00 via INTRAVENOUS
  Filled 2018-10-04: qty 5

## 2018-10-04 MED ORDER — INFLUENZA VAC SPLIT QUAD 0.5 ML IM SUSY
0.5000 mL | PREFILLED_SYRINGE | Freq: Once | INTRAMUSCULAR | Status: AC
  Administered 2018-10-04: 0.5 mL via INTRAMUSCULAR

## 2018-10-04 MED ORDER — LORAZEPAM 2 MG/ML IJ SOLN
INTRAMUSCULAR | Status: AC
  Filled 2018-10-04: qty 1

## 2018-10-04 MED ORDER — HEPARIN SOD (PORK) LOCK FLUSH 100 UNIT/ML IV SOLN
500.0000 [IU] | Freq: Once | INTRAVENOUS | Status: AC | PRN
  Administered 2018-10-04: 500 [IU]
  Filled 2018-10-04: qty 5

## 2018-10-04 MED ORDER — PALONOSETRON HCL INJECTION 0.25 MG/5ML
INTRAVENOUS | Status: AC
  Filled 2018-10-04: qty 5

## 2018-10-04 MED ORDER — LORAZEPAM 2 MG/ML IJ SOLN
0.5000 mg | Freq: Once | INTRAMUSCULAR | Status: AC
  Administered 2018-10-04: 13:00:00 0.5 mg via INTRAVENOUS

## 2018-10-04 MED ORDER — SODIUM CHLORIDE 0.9 % IV SOLN
600.0000 mg/m2 | Freq: Once | INTRAVENOUS | Status: AC
  Administered 2018-10-04: 12:00:00 1460 mg via INTRAVENOUS
  Filled 2018-10-04: qty 73

## 2018-10-04 NOTE — Progress Notes (Signed)
Patient complained of her stomach "feeling queasy", verbal order for Ativan 0.5mg  IV received from [place], [place] and administered to patient.

## 2018-10-04 NOTE — Assessment & Plan Note (Signed)
07/05/2018:Screening mammogram detected bilateral breast masses. Diagnostic mammogram and US showed cysts in the right breast and a 1.2cm left breast mass at the 9 o'clock position. 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 Stage Ia Brief course of neoadjuvant tamoxifen therapy started 07/07/2018 08/04/2018: Left lumpectomy:Left lumpectomy [name]): IDC with intermediate grade DCIS, 1.2cm, grade 1, clear margins, with 3 axillary lymph nodes negative for carcinoma ER 100%, PR 60%, Ki-67 50%  Oncotype DX score: 32, distant recurrence at 9 years with hormone therapy alone: 20%  Treatment plan: 1.Adjuvant chemotherapy with dose dense Adriamycin and Cytoxan x4 followed by Taxol x12 2.Given radiation therapy followed by 3.Adjuvant antiestrogen therapy ----------------------------------------------------------------------------------------------------------------------------- Current treatment: Cycle 3dose dense Adriamycin Cytoxan Chemo toxicities: 1.Mild nausea 2.Severe constipation: Encouraged to take stool softener 3 times a day and then use magnesium containing laxatives. 3.  Chemotherapy-induced anemia: We will monitoring it closely.  I discussed with her about increasing iron-containing foods. 4.  Weight fluctuations: Possibly due to diet changes.  I instructed her to cut down the carbohydrates. 5.  Severe bone pain especially will start him back and pelvis: Due to G-CSF injection. Ultram and Tylenol appears to be working for her.  Return to clinic in 2 weeks for cycle 4.

## 2018-10-04 NOTE — Patient Instructions (Signed)
[facility] Discharge Instructions for Patients Receiving Chemotherapy  Today you received the following chemotherapy agents Doxorubicin (ADRIAMYCIN) & Cyclophosphamide (CYTOXAN).  To help prevent nausea and vomiting after your treatment, we encourage you to take your nausea medication as prescribed.   If you develop nausea and vomiting that is not controlled by your nausea medication, call the clinic.   BELOW ARE SYMPTOMS THAT SHOULD BE REPORTED IMMEDIATELY:  *FEVER GREATER THAN 100.5 F  *CHILLS WITH OR WITHOUT FEVER  NAUSEA AND VOMITING THAT IS NOT CONTROLLED WITH YOUR NAUSEA MEDICATION  *UNUSUAL SHORTNESS OF BREATH  *UNUSUAL BRUISING OR BLEEDING  TENDERNESS IN MOUTH AND THROAT WITH OR WITHOUT PRESENCE OF ULCERS  *URINARY PROBLEMS  *BOWEL PROBLEMS  UNUSUAL RASH Items with * indicate a potential emergency and should be followed up as soon as possible.  Feel free to call the clinic should you have any questions or concerns. The clinic phone number is [phone].  Please show the [facility] at check-in to the Emergency Department and triage nurse.  Influenza Virus Vaccine injection What is this medicine? INFLUENZA VIRUS VACCINE (in floo EN zuh VAHY ruhs vak SEEN) helps to reduce the risk of getting influenza also known as the flu. The vaccine only helps protect you against some strains of the flu. This medicine may be used for other purposes; ask your health care provider or pharmacist if you have questions. COMMON BRAND NAME(S): Afluria, Afluria Quadrivalent, Agriflu, Alfuria, FLUAD, Fluarix, Fluarix Quadrivalent, Flublok, Flublok Quadrivalent, FLUCELVAX, Flulaval, Fluvirin, Fluzone, Fluzone High-Dose, Fluzone Intradermal What should I tell my health care provider before I take this medicine? They need to know if you have any of these conditions:  bleeding disorder like hemophilia  fever or infection  Guillain-Barre syndrome or other neurological  problems  immune system problems  infection with the human immunodeficiency virus (HIV) or AIDS  low blood platelet counts  multiple sclerosis  an unusual or allergic reaction to influenza virus vaccine, latex, other medicines, foods, dyes, or preservatives. Different brands of vaccines contain different allergens. Some may contain latex or eggs. Talk to your doctor about your allergies to make sure that you get the right vaccine.  pregnant or trying to get pregnant  breast-feeding How should I use this medicine? This vaccine is for injection into a muscle or under the skin. It is given by a health care professional. A copy of Vaccine Information Statements will be given before each vaccination. Read this sheet carefully each time. The sheet may change frequently. Talk to your healthcare provider to see which vaccines are right for you. Some vaccines should not be used in all age groups. Overdosage: If you think you have taken too much of this medicine contact a poison control center or emergency room at once. NOTE: This medicine is only for you. Do not share this medicine with others. What if I miss a dose? This does not apply. What may interact with this medicine?  chemotherapy or radiation therapy  medicines that lower your immune system like etanercept, anakinra, infliximab, and adalimumab  medicines that treat or prevent blood clots like warfarin  phenytoin  steroid medicines like prednisone or cortisone  theophylline  vaccines This list may not describe all possible interactions. Give your health care provider a list of all the medicines, herbs, non-prescription drugs, or dietary supplements you use. Also tell them if you smoke, drink alcohol, or use illegal drugs. Some items may interact with your medicine. What should I watch  for while using this medicine? Report any side effects that do not go away within 3 days to your doctor or health care professional. Call your  health care provider if any unusual symptoms occur within 6 weeks of receiving this vaccine. You may still catch the flu, but the illness is not usually as bad. You cannot get the flu from the vaccine. The vaccine will not protect against colds or other illnesses that may cause fever. The vaccine is needed every year. What side effects may I notice from receiving this medicine? Side effects that you should report to your doctor or health care professional as soon as possible:  allergic reactions like skin rash, itching or hives, swelling of the face, lips, or tongue Side effects that usually do not require medical attention (report to your doctor or health care professional if they continue or are bothersome):  fever  headache  muscle aches and pains  pain, tenderness, redness, or swelling at the injection site  tiredness This list may not describe all possible side effects. Call your doctor for medical advice about side effects. You may report side effects to FDA at 1-800-FDA-1088. Where should I keep my medicine? The vaccine will be given by a health care professional in a clinic, pharmacy, doctor's office, or other health care setting. You will not be given vaccine doses to store at home. NOTE: This sheet is a summary. It may not cover all possible information. If you have questions about this medicine, talk to your doctor, pharmacist, or health care provider.  2020 Elsevier/Gold Standard (2017-11-17 08:45:43)  Coronavirus (COVID-19) Are you at risk?  Are you at risk for the Coronavirus (COVID-19)?  To be considered HIGH RISK for Coronavirus (COVID-19), you have to meet the following criteria:  . Traveled to [place], [place], [place], [place] or [place]; or in the [place] to [place], [place], [place], or [place]; and have fever, cough, and shortness of breath within the last 2 weeks of travel OR . Been in close contact with a person diagnosed with COVID-19 within the  last 2 weeks and have fever, cough, and shortness of breath . IF YOU DO NOT MEET THESE CRITERIA, YOU ARE CONSIDERED LOW RISK FOR COVID-19.  What to do if you are HIGH RISK for COVID-19?  Marland Kitchen If you are having a medical emergency, call 911. . Seek medical care right away. Before you go to a doctor's office, urgent care or emergency department, call ahead and tell them about your recent travel, contact with someone diagnosed with COVID-19, and your symptoms. You should receive instructions from your physician's office regarding next steps of care.  . When you arrive at healthcare provider, tell the healthcare staff immediately you have returned from visiting [place], [place], [place], [place] or [place]; or traveled in the [place] to [place], [place], [place], or [place]; in the last two weeks or you have been in close contact with a person diagnosed with COVID-19 in the last 2 weeks.   . Tell the health care staff about your symptoms: fever, cough and shortness of breath. . After you have been seen by a medical provider, you will be either: o Tested for (COVID-19) and discharged home on quarantine except to seek medical care if symptoms worsen, and asked to  - Stay home and avoid contact with others until you get your results (4-5 days)  - Avoid travel on public transportation if possible (such as bus, train, or airplane)  or o Sent to the Emergency Department by EMS for evaluation, COVID-19 testing, and possible admission depending on your condition and test results.  What to do if you are LOW RISK for COVID-19?  Reduce your risk of any infection by using the same precautions used for avoiding the common cold or flu:  Marland Kitchen Wash your hands often with soap and warm water for at least 20 seconds.  If soap and water are not readily available, use an alcohol-based hand sanitizer with at least 60% alcohol.  . If coughing or sneezing, cover your mouth and nose by coughing or sneezing into the elbow  areas of your shirt or coat, into a tissue or into your sleeve (not your hands). . Avoid shaking hands with others and consider head nods or verbal greetings only. . Avoid touching your eyes, nose, or mouth with unwashed hands.  . Avoid close contact with people who are sick. . Avoid places or events with large numbers of people in one location, like concerts or sporting events. . Carefully consider travel plans you have or are making. . If you are planning any travel outside or inside the [place], visit the CDC's Travelers' Health webpage for the latest health notices. . If you have some symptoms but not all symptoms, continue to monitor at home and seek medical attention if your symptoms worsen. . If you are having a medical emergency, call 911.   [place] / e-Visit: [email or web address]         [facility]: [facility]: [id number]                   [facility]: [phone]

## 2018-10-05 ENCOUNTER — Other Ambulatory Visit: Payer: BC Managed Care – PPO

## 2018-10-05 ENCOUNTER — Ambulatory Visit: Payer: BC Managed Care – PPO

## 2018-10-05 ENCOUNTER — Ambulatory Visit: Payer: BC Managed Care – PPO | Admitting: Hematology and Oncology

## 2018-10-05 ENCOUNTER — Telehealth: Payer: Self-pay | Admitting: Medical

## 2018-10-05 NOTE — Telephone Encounter (Signed)
No los per 9/28. °

## 2018-10-06 ENCOUNTER — Inpatient Hospital Stay: Payer: BC Managed Care – PPO

## 2018-10-06 ENCOUNTER — Other Ambulatory Visit: Payer: Self-pay

## 2018-10-06 VITALS — BP 109/58 | HR 85 | Resp 18

## 2018-10-06 DIAGNOSIS — Z5111 Encounter for antineoplastic chemotherapy: Secondary | ICD-10-CM | POA: Diagnosis not present

## 2018-10-06 DIAGNOSIS — C50212 Malignant neoplasm of upper-inner quadrant of left female breast: Secondary | ICD-10-CM

## 2018-10-06 MED ORDER — PEGFILGRASTIM-JMDB 6 MG/0.6ML ~~LOC~~ SOSY
6.0000 mg | PREFILLED_SYRINGE | Freq: Once | SUBCUTANEOUS | Status: AC
  Administered 2018-10-06: 6 mg via SUBCUTANEOUS

## 2018-10-06 MED ORDER — PEGFILGRASTIM-JMDB 6 MG/0.6ML ~~LOC~~ SOSY
PREFILLED_SYRINGE | SUBCUTANEOUS | Status: AC
  Filled 2018-10-06: qty 0.6

## 2018-10-06 NOTE — Progress Notes (Signed)
This patient was seen in the infusion room today as she was receiving Adriamycin and Cytoxan.  Her Cytoxan.  Been completed.  She had sinus pressure, and a headache with her Cytoxan.  She had been given Aloxi, Emend, and Decadron.  During her Adriamycin push she began to develop "queasiness" and was given 0.5 mg of Ativan IV x1 with relief of her symptoms.  She was able to complete her treatment today without further issues of concern.  Sandi Mealy, MHS, PA-C Physician Assistant

## 2018-10-07 ENCOUNTER — Ambulatory Visit: Payer: BC Managed Care – PPO

## 2018-10-11 NOTE — Progress Notes (Signed)
Spoke to pt by phone on 10/11/2018 to reintroduce myself and offer counseling services.  She stated she has been "emotional" but that she has a support system around her that provides emotional support. She reported that her support system includes her husband, family, a breast cancer Facebook support group and several friends who have gone through breast cancer tx. Additionally, pt reported relying on her faith for strength and comfort. Pt stated that distressing side effects of the tx include hair loss, acne, and skin discomfort. She stated that she "feels lucky because others have it worse". Pt declined one-on-one counseling at this time, but requested an email with my contact information in case a need arises. I will call the pt in 2 weeks for a check-in to offer emotional and mental health support at that time.         Art Buff [place] [facility] Counseling Intern Voicemail:  [phone]

## 2018-10-16 NOTE — Progress Notes (Signed)
Patient Care Team: Patient, [name] as PCP - General (General Practice) [name], RN as Oncology Nurse Navigator [name], RN as Oncology Nurse Navigator [name], MD as Consulting Physician (Hematology and Oncology) [name], MD as Consulting Physician (General Surgery) [name], MD as Attending Physician (Radiation Oncology)  DIAGNOSIS:    ICD-10-CM   1. Malignant neoplasm of upper-inner quadrant of left breast in female, estrogen receptor positive ([place])  C50.212    Z17.0     SUMMARY OF ONCOLOGIC HISTORY: Oncology History  Malignant neoplasm of upper-inner quadrant of left breast in female, estrogen receptor positive ([place])  07/05/2018 Initial Diagnosis   Screening mammogram detected bilateral breast masses. Diagnostic mammogram and US showed cysts in the right breast and a 1.2cm left breast mass at the 9 o'clock position. 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    07/07/2018 Cancer Staging   Staging form: Breast, AJCC 8th Edition - Clinical stage from 07/07/2018: Stage IA (cT1c, cN0, cM0, G2, ER+, PR+, HER2-) - Signed by [name], MD on 07/07/2018    Genetic Testing   Negative testing. No pathogenic variants identified on the [facility] Common Hereditary Cancers Panel. The Common Hereditary Cancers Panel offered by [facility] includes sequencing and/or deletion duplication testing of the following 47 genes: APC, ATM, AXIN2, BARD1, BMPR1A, BRCA1, BRCA2, BRIP1, CDH1, CDKN2A (p14ARF), CDKN2A (p16INK4a), CKD4, CHEK2, CTNNA1, DICER1, EPCAM (Deletion/duplication testing only), GREM1 (promoter region deletion/duplication testing only), KIT, MEN1, MLH1, MSH2, MSH3, MSH6, MUTYH, NBN, NF1, NHTL1, PALB2, PDGFRA, PMS2, POLD1, POLE, PTEN, RAD50, RAD51C, RAD51D, SDHB, SDHC, SDHD, SMAD4, SMARCA4. STK11, TP53, TSC1, TSC2, and VHL.  The following genes were evaluated for sequence changes only: SDHA and HOXB13 c.251G>A variant only. The  report date is 07/18/2018.   08/04/2018 Surgery   Left lumpectomy [name]): IDC with intermediate grade DCIS, 1.2cm, grade 1, clear margins, with 3 axillary lymph nodes negative for carcinoma ER 100%, PR 60%, Ki-67 50%   09/07/2018 -  Chemotherapy   The patient had DOXOrubicin (ADRIAMYCIN) chemo injection 146 mg, 60 mg/m2 = 146 mg, Intravenous,  Once, 3 of 4 cycles Administration: 146 mg (09/07/2018), 146 mg (09/20/2018), 146 mg (10/04/2018) palonosetron (ALOXI) injection 0.25 mg, 0.25 mg, Intravenous,  Once, 3 of 4 cycles Administration: 0.25 mg (09/07/2018), 0.25 mg (09/20/2018), 0.25 mg (10/04/2018) pegfilgrastim-jmdb (FULPHILA) injection 6 mg, 6 mg, Subcutaneous,  Once, 3 of 4 cycles Administration: 6 mg (09/09/2018), 6 mg (09/22/2018), 6 mg (10/06/2018) cyclophosphamide (CYTOXAN) 1,460 mg in sodium chloride 0.9 % 250 mL chemo infusion, 600 mg/m2 = 1,460 mg, Intravenous,  Once, 3 of 4 cycles Administration: 1,460 mg (09/07/2018), 1,460 mg (09/20/2018), 1,460 mg (10/04/2018) PACLitaxel (TAXOL) 198 mg in sodium chloride 0.9 % 250 mL chemo infusion (</= 52m/m2), 80 mg/m2 = 198 mg, Intravenous,  Once, 0 of 12 cycles fosaprepitant (EMEND) 150 mg, dexamethasone (DECADRON) 12 mg in sodium chloride 0.9 % 145 mL IVPB, , Intravenous,  Once, 3 of 4 cycles Administration:  (09/07/2018),  (09/20/2018),  (10/04/2018)  for chemotherapy treatment.      CHIEF COMPLIANT: Cycle4Adriamycin and Cytoxan  INTERVAL HISTORY: INyssa Sayeghis a 53y.o. with above-mentioned history of left breast cancer who underwent a lumpectomyand is currently onadjuvant chemotherapy treatment withdose dense Adriamycin and Cytoxan.She presents to the clinic todayforcycle 4.   REVIEW OF SYSTEMS:   Constitutional: Denies fevers, chills or abnormal weight loss Eyes: Denies blurriness of vision Ears, nose, mouth, throat, and face:  Denies mucositis or sore throat Respiratory: Denies cough, dyspnea or wheezes Cardiovascular: Denies palpitation,  chest discomfort Gastrointestinal: Denies nausea, heartburn or change in bowel habits Skin: Denies abnormal skin rashes Lymphatics: Denies new lymphadenopathy or easy bruising Neurological: Denies numbness, tingling or new weaknesses Behavioral/Psych: Mood is stable, no new changes  Extremities: No lower extremity edema Breast: denies any pain or lumps or nodules in either breasts All other systems were reviewed with the patient and are negative.  I have reviewed the past medical history, past surgical history, social history and family history with the patient and they are unchanged from previous note.  ALLERGIES:  is allergic to aspirin; ibuprofen; hydrocodone; oxycodone; and phenergan [promethazine].  MEDICATIONS:  Current Outpatient Medications  Medication Sig Dispense Refill   acetaminophen (TYLENOL) 325 MG tablet Take 650 mg by mouth every 6 (six) hours as needed.     dexamethasone (DECADRON) 4 MG tablet Take 1 tablet day after chemo and 1 tablet 2 days after chemo with food 8 tablet 0   lidocaine-prilocaine (EMLA) cream Apply to affected area once 30 g 3   LORazepam (ATIVAN) 0.5 MG tablet Take 1 tablet (0.5 mg total) by mouth at bedtime as needed for sleep. 30 tablet 0   ondansetron (ZOFRAN) 8 MG tablet Take 1 tablet (8 mg total) by mouth 2 (two) times daily as needed. Start on the third day after chemotherapy. 30 tablet 1   prochlorperazine (COMPAZINE) 10 MG tablet Take 1 tablet (10 mg total) by mouth every 6 (six) hours as needed (Nausea or vomiting). 30 tablet 1   traMADol (ULTRAM) 50 MG tablet Take 1 tablet (50 mg total) by mouth every 6 (six) hours as needed. 60 tablet 0   No current facility-administered medications for this visit.     PHYSICAL EXAMINATION: ECOG PERFORMANCE STATUS: 1 - Symptomatic but completely ambulatory  Vitals:   10/18/18 0902  BP: 123/63  Pulse: 62  Resp: 17  Temp: 98.2 F (36.8 C)  SpO2: 99%   Filed Weights   10/18/18 0902  Weight:  283 lb 12.8 oz (128.7 kg)    GENERAL: alert, no distress and comfortable SKIN: skin color, texture, turgor are normal, no rashes or significant lesions EYES: normal, Conjunctiva are pink and non-injected, sclera clear OROPHARYNX: no exudate, no erythema and lips, buccal mucosa, and tongue normal  NECK: supple, thyroid normal size, non-tender, without nodularity LYMPH: no palpable lymphadenopathy in the cervical, axillary or inguinal LUNGS: clear to auscultation and percussion with normal breathing effort HEART: regular rate & rhythm and no murmurs and no lower extremity edema ABDOMEN: abdomen soft, non-tender and normal bowel sounds MUSCULOSKELETAL: no cyanosis of digits and no clubbing  NEURO: alert & oriented x 3 with fluent speech, no focal motor/sensory deficits EXTREMITIES: No lower extremity edema  LABORATORY DATA:  I have reviewed the data as listed CMP Latest Ref Rng & Units 10/04/2018 09/20/2018 09/14/2018  Glucose 70 - 99 mg/dL 102(H) 108(H) 106(H)  BUN 6 - 20 mg/dL _0 Creatinine 0.44 - 1.00 mg/dL 0.70 0.71 0.79  Sodium 135 - 145 mmol/L 141 142 139  Potassium 3.5 - 5.1 mmol/L 4.1 4.0 4.6  Chloride 98 - 111 mmol/L 107 108 104  CO2 22 - 32 mmol/L _1 Calcium 8.9 - 10.3 mg/dL 8.8(L) 8.7(L) 9.0  Total Protein 6.5 - 8.1 g/dL 6.5 6.3(L) 7.0  Total Bilirubin 0.3 - 1.2 mg/dL 0.3 0.2(L) 0.4  Alkaline Phos 38 - 126 U/L 112 119 110  AST 15 - 41 U/L 12(L) 16 9(L)  ALT 0 - 44 U/L 18 46(H) 17    Lab Results  Component Value Date   WBC 11.3 (H) 10/18/2018   HGB 10.6 (L) 10/18/2018   HCT 33.2 (L) 10/18/2018   MCV 93.8 10/18/2018   PLT 155 10/18/2018   NEUTROABS PENDING 10/18/2018    ASSESSMENT & PLAN:  Malignant neoplasm of upper-inner quadrant of left breast in female, estrogen receptor positive ([place]) 07/05/2018:Screening mammogram detected bilateral breast masses. Diagnostic mammogram and US showed cysts in the right breast and a 1.2cm left breast mass at the 9  o'clock position. 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 Stage Ia Brief course of neoadjuvant tamoxifen therapy started 07/07/2018 08/04/2018: Left lumpectomy:Left lumpectomy [name]): IDC with intermediate grade DCIS, 1.2cm, grade 1, clear margins, with 3 axillary lymph nodes negative for carcinoma ER 100%, PR 60%, Ki-67 50%  Oncotype DX score: 32, distant recurrence at 9 years with hormone therapy alone: 20%  Treatment plan: 1.Adjuvant chemotherapy with dose dense Adriamycin and Cytoxan x4 followed by Taxol x12 2.Given radiation therapy followed by 3.Adjuvant antiestrogen therapy ----------------------------------------------------------------------------------------------------------------------------- Current treatment: Cycle4dose dense Adriamycin Cytoxan Chemo toxicities: 1.Mild nausea 2.Severe constipation: Better with laxatives. 3.Chemotherapy-induced anemia: Stable 4.Weight fluctuations: Possibly due to diet changes. I instructed her to cut down the carbohydrates. 5.Severe bone pain especially will start him back and pelvis: Due to G-CSF injection. Ultram and Tylenol appears to be working for her. 6.  Fatigue 7.    Acneform rash on the face: Prescribed clindamycin cream 8.  Emotional outburst and tearfulness: Sent prescription for Effexor  Return to clinic in2 weeks for cycle 1 Taxol    No orders of the defined types were placed in this encounter.  The patient has a good understanding of the overall plan. she agrees with it. she will call with any problems that may develop before the next visit here.  [name], MD 10/18/2018  Julious Oka Dorshimer am acting as scribe for Dr. [name].  I have reviewed the above documentation for accuracy and completeness, and I agree with the above.

## 2018-10-18 ENCOUNTER — Inpatient Hospital Stay: Payer: BC Managed Care – PPO

## 2018-10-18 ENCOUNTER — Encounter: Payer: Self-pay | Admitting: *Deleted

## 2018-10-18 ENCOUNTER — Other Ambulatory Visit: Payer: Self-pay

## 2018-10-18 ENCOUNTER — Inpatient Hospital Stay: Payer: BC Managed Care – PPO | Attending: Hematology and Oncology

## 2018-10-18 ENCOUNTER — Inpatient Hospital Stay (HOSPITAL_BASED_OUTPATIENT_CLINIC_OR_DEPARTMENT_OTHER): Payer: BC Managed Care – PPO | Admitting: Hematology and Oncology

## 2018-10-18 DIAGNOSIS — C50212 Malignant neoplasm of upper-inner quadrant of left female breast: Secondary | ICD-10-CM

## 2018-10-18 DIAGNOSIS — Z170 Estrogen receptor positive status [ER+]: Secondary | ICD-10-CM

## 2018-10-18 DIAGNOSIS — D6481 Anemia due to antineoplastic chemotherapy: Secondary | ICD-10-CM | POA: Insufficient documentation

## 2018-10-18 DIAGNOSIS — R110 Nausea: Secondary | ICD-10-CM | POA: Insufficient documentation

## 2018-10-18 DIAGNOSIS — Z5111 Encounter for antineoplastic chemotherapy: Secondary | ICD-10-CM | POA: Diagnosis not present

## 2018-10-18 DIAGNOSIS — Z95828 Presence of other vascular implants and grafts: Secondary | ICD-10-CM | POA: Insufficient documentation

## 2018-10-18 DIAGNOSIS — Z5189 Encounter for other specified aftercare: Secondary | ICD-10-CM | POA: Diagnosis not present

## 2018-10-18 LAB — CMP (CANCER CENTER ONLY)
ALT: 15 U/L (ref 0–44)
AST: 13 U/L — ABNORMAL LOW (ref 15–41)
Albumin: 3.4 g/dL — ABNORMAL LOW (ref 3.5–5.0)
Alkaline Phosphatase: 104 U/L (ref 38–126)
Anion gap: 11 (ref 5–15)
BUN: 10 mg/dL (ref 6–20)
CO2: 25 mmol/L (ref 22–32)
Calcium: 8.8 mg/dL — ABNORMAL LOW (ref 8.9–10.3)
Chloride: 106 mmol/L (ref 98–111)
Creatinine: 0.71 mg/dL (ref 0.44–1.00)
GFR, Est AFR Am: >60 mL/min (ref >60)
GFR, Estimated: >60 mL/min (ref >60)
Glucose, Bld: 102 mg/dL — ABNORMAL HIGH (ref 70–99)
Potassium: 3.9 mmol/L (ref 3.5–5.1)
Sodium: 142 mmol/L (ref 135–145)
Total Bilirubin: 0.3 mg/dL (ref 0.3–1.2)
Total Protein: 6.4 g/dL — ABNORMAL LOW (ref 6.5–8.1)

## 2018-10-18 LAB — CBC WITH DIFFERENTIAL (CANCER CENTER ONLY)
Abs Immature Granulocytes: 1.1 10*3/uL — ABNORMAL HIGH (ref 0.00–0.07)
Band Neutrophils: 16 %
Basophils Absolute: 0 10*3/uL (ref 0.0–0.1)
Basophils Relative: 0 %
Eosinophils Absolute: 0.1 10*3/uL (ref 0.0–0.5)
Eosinophils Relative: 1 %
HCT: 33.2 % — ABNORMAL LOW (ref 36.0–46.0)
Hemoglobin: 10.6 g/dL — ABNORMAL LOW (ref 12.0–15.0)
Lymphocytes Relative: 7 %
Lymphs Abs: 0.8 10*3/uL (ref 0.7–4.0)
MCH: 29.9 pg (ref 26.0–34.0)
MCHC: 31.9 g/dL (ref 30.0–36.0)
MCV: 93.8 fL (ref 80.0–100.0)
Metamyelocytes Relative: 8 %
Monocytes Absolute: 1.4 10*3/uL — ABNORMAL HIGH (ref 0.1–1.0)
Monocytes Relative: 12 %
Myelocytes: 2 %
Neutro Abs: 7.9 10*3/uL (ref 1.7–17.7)
Neutrophils Relative %: 54 %
Platelet Count: 155 10*3/uL (ref 150–400)
RBC: 3.54 MIL/uL — ABNORMAL LOW (ref 3.87–5.11)
RDW: 19.4 % — ABNORMAL HIGH (ref 11.5–15.5)
WBC Count: 11.3 10*3/uL — ABNORMAL HIGH (ref 4.0–10.5)
nRBC: 1 % — ABNORMAL HIGH (ref 0.0–0.2)

## 2018-10-18 MED ORDER — SODIUM CHLORIDE 0.9 % IV SOLN
600.0000 mg/m2 | Freq: Once | INTRAVENOUS | Status: AC
  Administered 2018-10-18: 1460 mg via INTRAVENOUS
  Filled 2018-10-18: qty 73

## 2018-10-18 MED ORDER — PALONOSETRON HCL INJECTION 0.25 MG/5ML
INTRAVENOUS | Status: AC
  Filled 2018-10-18: qty 5

## 2018-10-18 MED ORDER — SODIUM CHLORIDE 0.9% FLUSH
10.0000 mL | INTRAVENOUS | Status: DC | PRN
  Administered 2018-10-18: 10 mL
  Filled 2018-10-18: qty 10

## 2018-10-18 MED ORDER — DOXORUBICIN HCL CHEMO IV INJECTION 2 MG/ML
60.0000 mg/m2 | Freq: Once | INTRAVENOUS | Status: AC
  Administered 2018-10-18: 146 mg via INTRAVENOUS
  Filled 2018-10-18: qty 73

## 2018-10-18 MED ORDER — CLINDAMYCIN PHOSPHATE 1 % EX GEL: Freq: Two times a day (BID) | CUTANEOUS | 0 refills | Status: DC

## 2018-10-18 MED ORDER — SODIUM CHLORIDE 0.9 % IV SOLN
Freq: Once | INTRAVENOUS | Status: AC
  Administered 2018-10-18: 10:00:00 via INTRAVENOUS
  Filled 2018-10-18: qty 5

## 2018-10-18 MED ORDER — SODIUM CHLORIDE 0.9 % IV SOLN
Freq: Once | INTRAVENOUS | Status: AC
  Administered 2018-10-18: 10:00:00 via INTRAVENOUS
  Filled 2018-10-18: qty 250

## 2018-10-18 MED ORDER — VENLAFAXINE HCL ER 37.5 MG PO CP24: 37.5000 mg | ORAL_CAPSULE | Freq: Every day | ORAL | 3 refills | Status: DC

## 2018-10-18 MED ORDER — PALONOSETRON HCL INJECTION 0.25 MG/5ML
0.2500 mg | Freq: Once | INTRAVENOUS | Status: AC
  Administered 2018-10-18: 0.25 mg via INTRAVENOUS

## 2018-10-18 MED ORDER — HEPARIN SOD (PORK) LOCK FLUSH 100 UNIT/ML IV SOLN
500.0000 [IU] | Freq: Once | INTRAVENOUS | Status: AC | PRN
  Administered 2018-10-18: 500 [IU]
  Filled 2018-10-18: qty 5

## 2018-10-18 NOTE — Assessment & Plan Note (Signed)
07/05/2018:Screening mammogram detected bilateral breast masses. Diagnostic mammogram and US showed cysts in the right breast and a 1.2cm left breast mass at the 9 o'clock position. 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 Stage Ia Brief course of neoadjuvant tamoxifen therapy started 07/07/2018 08/04/2018: Left lumpectomy:Left lumpectomy [name]): IDC with intermediate grade DCIS, 1.2cm, grade 1, clear margins, with 3 axillary lymph nodes negative for carcinoma ER 100%, PR 60%, Ki-67 50%  Oncotype DX score: 32, distant recurrence at 9 years with hormone therapy alone: 20%  Treatment plan: 1.Adjuvant chemotherapy with dose dense Adriamycin and Cytoxan x4 followed by Taxol x12 2.Given radiation therapy followed by 3.Adjuvant antiestrogen therapy ----------------------------------------------------------------------------------------------------------------------------- Current treatment: Cycle4dose dense Adriamycin Cytoxan Chemo toxicities: 1.Mild nausea 2.Severe constipation: Encouraged to take stool softener 3 times a day and then use magnesium containing laxatives. 3.Chemotherapy-induced anemia: We will monitoring it closely. I discussed with her about increasing iron-containing foods. 4.Weight fluctuations: Possibly due to diet changes. I instructed her to cut down the carbohydrates. 5.Severe bone pain especially will start him back and pelvis: Due to G-CSF injection. Ultram and Tylenol appears to be working for her. 6.  Fatigue 7.  Mild nausea Return to clinic in2 weeks for cycle 1 Taxol

## 2018-10-18 NOTE — Research (Signed)
10/18/2018 at 10:21am - Dr. Lindi Adie felt that this pt was an ideal candidate for the S1714 study.  The research nurse met with the pt during the pt's last AC chemo infusion today to discuss participation in the S1714 study.  The pt was informed that this study is completely voluntary.  The pt was briefly informed about the study details.  The pt was interested in learning more about the trial.  The pt agreed to read the consent form and hipaa form.  The pt was also given the research nurse's business card if she had any questions about the study.  The pt and nurse agreed to speak on Monday, 10/25/18 about her study participation.  The pt was told that she must consent and enroll on the trial on 11/01/18 before her first taxol infusion.  The pt verbalized understanding.  The pt was thanked for her interest in the trial.  Brion Aliment RN, BSN, CCRP Clinical Research Nurse 10/18/2018 10:24 AM

## 2018-10-18 NOTE — Patient Instructions (Signed)
[facility] Discharge Instructions for Patients Receiving Chemotherapy  Today you received the following chemotherapy agents Adriamycin; Cytoxan  To help prevent nausea and vomiting after your treatment, we encourage you to take your nausea medication as directed   If you develop nausea and vomiting that is not controlled by your nausea medication, call the clinic.   BELOW ARE SYMPTOMS THAT SHOULD BE REPORTED IMMEDIATELY:  *FEVER GREATER THAN 100.5 F  *CHILLS WITH OR WITHOUT FEVER  NAUSEA AND VOMITING THAT IS NOT CONTROLLED WITH YOUR NAUSEA MEDICATION  *UNUSUAL SHORTNESS OF BREATH  *UNUSUAL BRUISING OR BLEEDING  TENDERNESS IN MOUTH AND THROAT WITH OR WITHOUT PRESENCE OF ULCERS  *URINARY PROBLEMS  *BOWEL PROBLEMS  UNUSUAL RASH Items with * indicate a potential emergency and should be followed up as soon as possible.  Feel free to call the clinic should you have any questions or concerns. The clinic phone number is [phone].  Please show the CHEMO ALERT CARD at check-in to the Emergency Department and triage nurse.   

## 2018-10-19 ENCOUNTER — Ambulatory Visit: Payer: BC Managed Care – PPO | Admitting: Hematology and Oncology

## 2018-10-19 ENCOUNTER — Ambulatory Visit: Payer: BC Managed Care – PPO

## 2018-10-19 ENCOUNTER — Other Ambulatory Visit: Payer: BC Managed Care – PPO

## 2018-10-19 ENCOUNTER — Telehealth: Payer: Self-pay | Admitting: Hematology and Oncology

## 2018-10-19 NOTE — Telephone Encounter (Signed)
No los 10/12 

## 2018-10-20 ENCOUNTER — Inpatient Hospital Stay: Payer: BC Managed Care – PPO

## 2018-10-20 ENCOUNTER — Other Ambulatory Visit: Payer: Self-pay

## 2018-10-20 DIAGNOSIS — Z170 Estrogen receptor positive status [ER+]: Secondary | ICD-10-CM

## 2018-10-20 DIAGNOSIS — C50212 Malignant neoplasm of upper-inner quadrant of left female breast: Secondary | ICD-10-CM

## 2018-10-20 DIAGNOSIS — Z5111 Encounter for antineoplastic chemotherapy: Secondary | ICD-10-CM | POA: Diagnosis not present

## 2018-10-20 MED ORDER — PEGFILGRASTIM-JMDB 6 MG/0.6ML ~~LOC~~ SOSY
6.0000 mg | PREFILLED_SYRINGE | Freq: Once | SUBCUTANEOUS | Status: AC
  Administered 2018-10-20: 6 mg via SUBCUTANEOUS

## 2018-10-20 MED ORDER — PEGFILGRASTIM-JMDB 6 MG/0.6ML ~~LOC~~ SOSY
PREFILLED_SYRINGE | SUBCUTANEOUS | Status: AC
  Filled 2018-10-20: qty 0.6

## 2018-10-20 NOTE — Patient Instructions (Signed)

## 2018-10-21 ENCOUNTER — Ambulatory Visit: Payer: BC Managed Care – PPO

## 2018-10-27 ENCOUNTER — Encounter: Payer: Self-pay | Admitting: *Deleted

## 2018-10-27 NOTE — Progress Notes (Signed)
10/27/2018 at 10:05am - [id number] discussion with the pt- The research nurse called the pt and went over all of the details of the study.  All of the pt's questions were answered.  The pt was emotional as she discussed her struggles dealing with her chemo treatment.  The pt was told numerous times that it is "okay" to decline this study since she is dealing with so many health and family issues now.  The pt said that she wanted to do the study to help other patients, but she also feels that she cannot put anything else on her now.  The pt was told that she would have to come in at least 45 minutes earlier than her scheduled appts in order to sign the consent form and complete the questionnaires before registration.  The pt said that it is very emotional for her to come to her treatments, and she also has to coordinate his care on her treatment days. The pt said that she wanted some extra day to consider the study.  The research nurse will call the pt tomorrow to further discuss her study participation.  Emotional support was given to the pt.  Brion Aliment RN, BSN, [place] Clinical Research Nurse 10/27/2018 10:11 AM   10/28/2018 at 10:07am [phone] follow up discussion with the pt- The pt was very emotional on the phone this morning.  She is dreading coming to the cancer center every week for her chemo.  The pt was tearful, and the research nurse offered much emotional support to the pt.  The nurse offered to have the social worker contact the pt for further support.  The pt said that she has spoken to the social worker in the past, and she did not feel like she needed to speak to her today.  The nurse offered to talk with Dr. Lindi Adie, but the pt said she would talk to him on Monday.  The pt said that she was sorry, but she needed to decline the study because she is just too overwhelmed.  The pt was thanked for her interest in the trial.  The pt was strongly encouraged to call Dr. Lindi Adie or myself if she needs  further support.  The pt verbalized understanding. Brion Aliment RN, BSN, CCRP  Clinical Research Nurse 10/28/2018 10:12 AM

## 2018-10-31 NOTE — Progress Notes (Signed)
Patient Care Team: Patient, [name] as PCP - General (General Practice) Caitlin Kaufmann, RN as Oncology Nurse Navigator Caitlin Germany, RN as Oncology Nurse Navigator Caitlin Lose, MD as Consulting Physician (Hematology and Oncology) Caitlin Kussmaul, MD as Consulting Physician (General Surgery) Caitlin Gibson, MD as Attending Physician (Radiation Oncology)  DIAGNOSIS:    ICD-10-CM   1. Malignant neoplasm of upper-inner quadrant of left breast in female, estrogen receptor positive ([place])  C50.212    Z17.0     SUMMARY OF ONCOLOGIC HISTORY: Oncology History  Malignant neoplasm of upper-inner quadrant of left breast in female, estrogen receptor positive ([place])  07/05/2018 Initial Diagnosis   Screening mammogram detected bilateral breast masses. Diagnostic mammogram and US showed cysts in the right breast and a 1.2cm left breast mass at the 9 o'clock position. 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    07/07/2018 Cancer Staging   Staging form: Breast, AJCC 8th Edition - Clinical stage from 07/07/2018: Stage IA (cT1c, cN0, cM0, G2, ER+, PR+, HER2-) - Signed by Caitlin Lose, MD on 07/07/2018    Genetic Testing   Negative testing. No pathogenic variants identified on the [facility] Hereditary Cancers Panel. The [facility] Hereditary Cancers Panel offered by [facility] includes sequencing and/or deletion duplication testing of the following 47 genes: APC, ATM, AXIN2, BARD1, BMPR1A, BRCA1, BRCA2, BRIP1, CDH1, CDKN2A (p14ARF), CDKN2A (p16INK4a), CKD4, CHEK2, CTNNA1, DICER1, EPCAM (Deletion/duplication testing only), GREM1 (promoter region deletion/duplication testing only), KIT, MEN1, MLH1, MSH2, MSH3, MSH6, MUTYH, NBN, NF1, NHTL1, PALB2, PDGFRA, PMS2, POLD1, POLE, PTEN, RAD50, RAD51C, RAD51D, SDHB, SDHC, SDHD, SMAD4, SMARCA4. STK11, TP53, TSC1, TSC2, and VHL.  The following genes were evaluated for sequence changes only: SDHA and HOXB13 c.251G>A variant only. The  report date is 07/18/2018.   08/04/2018 Surgery   Left lumpectomy Caitlin Graham): IDC with intermediate grade DCIS, 1.2cm, grade 1, clear margins, with 3 axillary lymph nodes negative for carcinoma ER 100%, PR 60%, Ki-67 50%   09/07/2018 -  Chemotherapy   The patient had DOXOrubicin (ADRIAMYCIN) chemo injection 146 mg, 60 mg/m2 = 146 mg, Intravenous,  Once, 4 of 4 cycles Administration: 146 mg (09/07/2018), 146 mg (09/20/2018), 146 mg (10/04/2018), 146 mg (10/18/2018) palonosetron (ALOXI) injection 0.25 mg, 0.25 mg, Intravenous,  Once, 4 of 4 cycles Administration: 0.25 mg (09/07/2018), 0.25 mg (09/20/2018), 0.25 mg (10/04/2018), 0.25 mg (10/18/2018) pegfilgrastim-jmdb (FULPHILA) injection 6 mg, 6 mg, Subcutaneous,  Once, 4 of 4 cycles Administration: 6 mg (09/09/2018), 6 mg (09/22/2018), 6 mg (10/06/2018), 6 mg (10/20/2018) cyclophosphamide (CYTOXAN) 1,460 mg in sodium chloride 0.9 % 250 mL chemo infusion, 600 mg/m2 = 1,460 mg, Intravenous,  Once, 4 of 4 cycles Administration: 1,460 mg (09/07/2018), 1,460 mg (09/20/2018), 1,460 mg (10/04/2018), 1,460 mg (10/18/2018) PACLitaxel (TAXOL) 198 mg in sodium chloride 0.9 % 250 mL chemo infusion (</= '80mg'$ /m2), 80 mg/m2 = 198 mg, Intravenous,  Once, 0 of 12 cycles fosaprepitant (EMEND) 150 mg, dexamethasone (DECADRON) 12 mg in sodium chloride 0.9 % 145 mL IVPB, , Intravenous,  Once, 4 of 4 cycles Administration:  (09/07/2018),  (09/20/2018),  (10/04/2018),  (10/18/2018)  for chemotherapy treatment.      CHIEF COMPLIANT: Cycle 1 Taxol  INTERVAL HISTORY: Caitlin Graham is a 53 y.o. with above-mentioned history of left breast cancer who underwent a lumpectomyand is currently onadjuvant chemotherapy treatment. She completed 4 cycles of dense Adriamycin and Cytoxan and is currently receiving weekly Taxol.She presents to the clinic todayforcycle1 Taxol.  She  tells me that the nausea was extremely bad over the last cycle of chemo.  She has been able to recover from that and this past  week she has done much better.  She is anxious about Taxol.   REVIEW OF SYSTEMS:   Constitutional: Denies fevers, chills or abnormal weight loss Eyes: Denies blurriness of vision Ears, nose, mouth, throat, and face: Denies mucositis or sore throat Respiratory: Denies cough, dyspnea or wheezes Cardiovascular: Denies palpitation, chest discomfort Gastrointestinal: Denies nausea, heartburn or change in bowel habits Skin: Denies abnormal skin rashes Lymphatics: Denies new lymphadenopathy or easy bruising Neurological: Denies numbness, tingling or new weaknesses Behavioral/Psych: Mood is stable, no new changes  Extremities: No lower extremity edema Breast: denies any pain or lumps or nodules in either breasts All other systems were reviewed with the patient and are negative.  I have reviewed the past medical history, past surgical history, social history and family history with the patient and they are unchanged from previous note.  ALLERGIES:  is allergic to aspirin; ibuprofen; hydrocodone; oxycodone; and phenergan [promethazine].  MEDICATIONS:  Current Outpatient Medications  Medication Sig Dispense Refill  . acetaminophen (TYLENOL) 325 MG tablet Take 650 mg by mouth every 6 (six) hours as needed.    . clindamycin (CLINDAGEL) 1 % gel Apply topically 2 (two) times daily. 30 g 0  . dexamethasone (DECADRON) 4 MG tablet Take 1 tablet day after chemo and 1 tablet 2 days after chemo with food 8 tablet 0  . lidocaine-prilocaine (EMLA) cream Apply to affected area once 30 g 3  . LORazepam (ATIVAN) 0.5 MG tablet Take 1 tablet (0.5 mg total) by mouth at bedtime as needed for sleep. 30 tablet 0  . ondansetron (ZOFRAN) 8 MG tablet Take 1 tablet (8 mg total) by mouth 2 (two) times daily as needed. Start on the third day after chemotherapy. 30 tablet 1  . prochlorperazine (COMPAZINE) 10 MG tablet Take 1 tablet (10 mg total) by mouth every 6 (six) hours as needed (Nausea or vomiting). 30 tablet 1  .  traMADol (ULTRAM) 50 MG tablet Take 1 tablet (50 mg total) by mouth every 6 (six) hours as needed. 60 tablet 0  . venlafaxine XR (EFFEXOR XR) 37.5 MG 24 hr capsule Take 1 capsule (37.5 mg total) by mouth daily with breakfast. 30 capsule 3   No current facility-administered medications for this visit.     PHYSICAL EXAMINATION: ECOG PERFORMANCE STATUS: 1 - Symptomatic but completely ambulatory  Vitals:   11/01/18 1056  BP: 104/72  Pulse: 68  Resp: 17  Temp: 98 F (36.7 C)  SpO2: 99%   Filed Weights   11/01/18 1056  Weight: 279 lb 11.2 oz (126.9 kg)    GENERAL: alert, no distress and comfortable SKIN: skin color, texture, turgor are normal, no rashes or significant lesions EYES: normal, Conjunctiva are pink and non-injected, sclera clear OROPHARYNX: no exudate, no erythema and lips, buccal mucosa, and tongue normal  NECK: supple, thyroid normal size, non-tender, without nodularity LYMPH: no palpable lymphadenopathy in the cervical, axillary or inguinal LUNGS: clear to auscultation and percussion with normal breathing effort HEART: regular rate & rhythm and no murmurs and no lower extremity edema ABDOMEN: abdomen soft, non-tender and normal bowel sounds MUSCULOSKELETAL: no cyanosis of digits and no clubbing  NEURO: alert & oriented x 3 with fluent speech, no focal motor/sensory deficits EXTREMITIES: No lower extremity edema  LABORATORY DATA:  I have reviewed the data as listed CMP Latest Ref Rng & Units  11/01/2018 10/18/2018 10/04/2018  Glucose 70 - 99 mg/dL 101(H) 102(H) 102(H)  BUN 6 - 20 mg/dL '11 10 13  '$ Creatinine 0.44 - 1.00 mg/dL 0.75 0.71 0.70  Sodium 135 - 145 mmol/L 140 142 141  Potassium 3.5 - 5.1 mmol/L 4.0 3.9 4.1  Chloride 98 - 111 mmol/L 105 106 107  CO2 22 - 32 mmol/L '24 25 25  '$ Calcium 8.9 - 10.3 mg/dL 9.1 8.8(L) 8.8(L)  Total Protein 6.5 - 8.1 g/dL 6.6 6.4(L) 6.5  Total Bilirubin 0.3 - 1.2 mg/dL 0.4 0.3 0.3  Alkaline Phos 38 - 126 U/L 102 104 112  AST 15 -  41 U/L 12(L) 13(L) 12(L)  ALT 0 - 44 U/L '17 15 18    '$ Lab Results  Component Value Date   WBC 10.6 (H) 11/01/2018   HGB 10.7 (L) 11/01/2018   HCT 33.4 (L) 11/01/2018   MCV 95.2 11/01/2018   PLT 130 (L) 11/01/2018   NEUTROABS 6.1 11/01/2018    ASSESSMENT & PLAN:  Malignant neoplasm of upper-inner quadrant of left breast in female, estrogen receptor positive ([place]) 07/05/2018:Screening mammogram detected bilateral breast masses. Diagnostic mammogram and US showed cysts in the right breast and a 1.2cm left breast mass at the 9 o'clock position. 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 Stage Ia Brief course of neoadjuvant tamoxifen therapy started 07/07/2018 08/04/2018: Left lumpectomy:Left lumpectomy Caitlin Graham): IDC with intermediate grade DCIS, 1.2cm, grade 1, clear margins, with 3 axillary lymph nodes negative for carcinoma ER 100%, PR 60%, Ki-67 50%  Oncotype DX score: 32, distant recurrence at 9 years with hormone therapy alone: 20%  Treatment plan: 1.Adjuvant chemotherapy with dose dense Adriamycin and Cytoxan x4 followed by Taxol x12 2.Given radiation therapy followed by 3.Adjuvant antiestrogen therapy ----------------------------------------------------------------------------------------------------------------------------- Current treatment: Completed 4 cycles ofdose dense Adriamycin Cytoxan, today cycle 1 Taxol  Chemo toxicities: 1.Severe nausea due to Adriamycin and Cytoxan 2.Severe constipation: Better with laxatives. 3.Chemotherapy-induced anemia: Stable 4.Weight fluctuations: Possibly due to diet changes. I instructed her to cut down the carbohydrates. 5.Fatigue 7.Acneform rash on the face: Prescribed clindamycin cream 8.  Emotional outburst and tearfulness: Currently on Effexor 37.5 mg.  We may increase it if necessary.  Patient is worried about going to school but she is also missing school and wants to go back.   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  Return to clinic in1 week for toxicity check and for cycle 2    No orders of the defined types were placed in this encounter.  The patient has a good understanding of the overall plan. she agrees with it. she will call with any problems that may develop before the next visit here.  Caitlin Lose, MD 11/01/2018  Julious Oka Dorshimer am acting as scribe for Dr. Nicholas Graham.  I have reviewed the above documentation for accuracy and completeness, and I agree with the above.'

## 2018-11-01 ENCOUNTER — Encounter: Payer: Self-pay | Admitting: *Deleted

## 2018-11-01 ENCOUNTER — Inpatient Hospital Stay: Payer: BC Managed Care – PPO

## 2018-11-01 ENCOUNTER — Inpatient Hospital Stay (HOSPITAL_BASED_OUTPATIENT_CLINIC_OR_DEPARTMENT_OTHER): Payer: BC Managed Care – PPO | Admitting: Hematology and Oncology

## 2018-11-01 ENCOUNTER — Other Ambulatory Visit: Payer: Self-pay

## 2018-11-01 VITALS — BP 102/66 | HR 70 | Temp 98.4°F | Resp 18

## 2018-11-01 DIAGNOSIS — Z170 Estrogen receptor positive status [ER+]: Secondary | ICD-10-CM | POA: Diagnosis not present

## 2018-11-01 DIAGNOSIS — C50212 Malignant neoplasm of upper-inner quadrant of left female breast: Secondary | ICD-10-CM

## 2018-11-01 DIAGNOSIS — Z5111 Encounter for antineoplastic chemotherapy: Secondary | ICD-10-CM | POA: Diagnosis not present

## 2018-11-01 DIAGNOSIS — Z95828 Presence of other vascular implants and grafts: Secondary | ICD-10-CM

## 2018-11-01 LAB — CBC WITH DIFFERENTIAL (CANCER CENTER ONLY)
Abs Immature Granulocytes: 1.91 10*3/uL — ABNORMAL HIGH (ref 0.00–0.07)
Basophils Absolute: 0.2 10*3/uL — ABNORMAL HIGH (ref 0.0–0.1)
Basophils Relative: 2 %
Eosinophils Absolute: 0 10*3/uL (ref 0.0–0.5)
Eosinophils Relative: 0 %
HCT: 33.4 % — ABNORMAL LOW (ref 36.0–46.0)
Hemoglobin: 10.7 g/dL — ABNORMAL LOW (ref 12.0–15.0)
Immature Granulocytes: 18 %
Lymphocytes Relative: 10 %
Lymphs Abs: 1.1 10*3/uL (ref 0.7–4.0)
MCH: 30.5 pg (ref 26.0–34.0)
MCHC: 32 g/dL (ref 30.0–36.0)
MCV: 95.2 fL (ref 80.0–100.0)
Monocytes Absolute: 1.4 10*3/uL — ABNORMAL HIGH (ref 0.1–1.0)
Monocytes Relative: 13 %
Neutro Abs: 6.1 10*3/uL (ref 1.7–7.7)
Neutrophils Relative %: 57 %
Platelet Count: 130 10*3/uL — ABNORMAL LOW (ref 150–400)
RBC: 3.51 MIL/uL — ABNORMAL LOW (ref 3.87–5.11)
RDW: 21.4 % — ABNORMAL HIGH (ref 11.5–15.5)
WBC Count: 10.6 10*3/uL — ABNORMAL HIGH (ref 4.0–10.5)
nRBC: 1 % — ABNORMAL HIGH (ref 0.0–0.2)

## 2018-11-01 LAB — CMP (CANCER CENTER ONLY)
ALT: 17 U/L (ref 0–44)
AST: 12 U/L — ABNORMAL LOW (ref 15–41)
Albumin: 3.5 g/dL (ref 3.5–5.0)
Alkaline Phosphatase: 102 U/L (ref 38–126)
Anion gap: 11 (ref 5–15)
BUN: 11 mg/dL (ref 6–20)
CO2: 24 mmol/L (ref 22–32)
Calcium: 9.1 mg/dL (ref 8.9–10.3)
Chloride: 105 mmol/L (ref 98–111)
Creatinine: 0.75 mg/dL (ref 0.44–1.00)
GFR, Est AFR Am: >60 mL/min (ref >60)
GFR, Estimated: >60 mL/min (ref >60)
Glucose, Bld: 101 mg/dL — ABNORMAL HIGH (ref 70–99)
Potassium: 4 mmol/L (ref 3.5–5.1)
Sodium: 140 mmol/L (ref 135–145)
Total Bilirubin: 0.4 mg/dL (ref 0.3–1.2)
Total Protein: 6.6 g/dL (ref 6.5–8.1)

## 2018-11-01 MED ORDER — DIPHENHYDRAMINE HCL 50 MG/ML IJ SOLN
50.0000 mg | Freq: Once | INTRAMUSCULAR | Status: AC
  Administered 2018-11-01: 12:00:00 50 mg via INTRAVENOUS

## 2018-11-01 MED ORDER — HEPARIN SOD (PORK) LOCK FLUSH 100 UNIT/ML IV SOLN
500.0000 [IU] | Freq: Once | INTRAVENOUS | Status: AC | PRN
  Administered 2018-11-01: 500 [IU]
  Filled 2018-11-01: qty 5

## 2018-11-01 MED ORDER — SODIUM CHLORIDE 0.9% FLUSH
10.0000 mL | INTRAVENOUS | Status: DC | PRN
  Administered 2018-11-01: 10 mL
  Filled 2018-11-01: qty 10

## 2018-11-01 MED ORDER — FAMOTIDINE IN NACL 20-0.9 MG/50ML-% IV SOLN
INTRAVENOUS | Status: AC
  Filled 2018-11-01: qty 50

## 2018-11-01 MED ORDER — FAMOTIDINE IN NACL 20-0.9 MG/50ML-% IV SOLN
20.0000 mg | Freq: Once | INTRAVENOUS | Status: AC
  Administered 2018-11-01: 20 mg via INTRAVENOUS

## 2018-11-01 MED ORDER — SODIUM CHLORIDE 0.9 % IV SOLN
Freq: Once | INTRAVENOUS | Status: AC
  Administered 2018-11-01: 12:00:00 via INTRAVENOUS
  Filled 2018-11-01: qty 250

## 2018-11-01 MED ORDER — SODIUM CHLORIDE 0.9% FLUSH
10.0000 mL | INTRAVENOUS | Status: DC | PRN
  Administered 2018-11-01: 11:00:00 10 mL
  Filled 2018-11-01: qty 10

## 2018-11-01 MED ORDER — SODIUM CHLORIDE 0.9 % IV SOLN
20.0000 mg | Freq: Once | INTRAVENOUS | Status: AC
  Administered 2018-11-01: 20 mg via INTRAVENOUS
  Filled 2018-11-01: qty 20

## 2018-11-01 MED ORDER — DIPHENHYDRAMINE HCL 50 MG/ML IJ SOLN
INTRAMUSCULAR | Status: AC
  Filled 2018-11-01: qty 1

## 2018-11-01 MED ORDER — SODIUM CHLORIDE 0.9 % IV SOLN
80.0000 mg/m2 | Freq: Once | INTRAVENOUS | Status: AC
  Administered 2018-11-01: 198 mg via INTRAVENOUS
  Filled 2018-11-01: qty 33

## 2018-11-01 NOTE — Patient Instructions (Signed)

## 2018-11-01 NOTE — Patient Instructions (Signed)
Paclitaxel injection What is this medicine? PACLITAXEL (PAK li TAX el) is a chemotherapy drug. It targets fast dividing cells, like cancer cells, and causes these cells to die. This medicine is used to treat ovarian cancer, breast cancer, lung cancer, Kaposi's sarcoma, and other cancers. This medicine may be used for other purposes; ask your health care provider or pharmacist if you have questions. COMMON BRAND NAME(S): Onxol, Taxol What should I tell my health care provider before I take this medicine? They need to know if you have any of these conditions:  history of irregular heartbeat  liver disease  low blood counts, like low white cell, platelet, or red cell counts  lung or breathing disease, like asthma  tingling of the fingers or toes, or other nerve disorder  an unusual or allergic reaction to paclitaxel, alcohol, polyoxyethylated castor oil, other chemotherapy, other medicines, foods, dyes, or preservatives  pregnant or trying to get pregnant  breast-feeding How should I use this medicine? This drug is given as an infusion into a vein. It is administered in a hospital or clinic by a specially trained health care professional. Talk to your pediatrician regarding the use of this medicine in children. Special care may be needed. Overdosage: If you think you have taken too much of this medicine contact a poison control center or emergency room at once. NOTE: This medicine is only for you. Do not share this medicine with others. What if I miss a dose? It is important not to miss your dose. Call your doctor or health care professional if you are unable to keep an appointment. What may interact with this medicine? Do not take this medicine with any of the following medications:  disulfiram  metronidazole This medicine may also interact with the following medications:  antiviral medicines for hepatitis, HIV or AIDS  certain antibiotics like erythromycin and  clarithromycin  certain medicines for fungal infections like ketoconazole and itraconazole  certain medicines for seizures like carbamazepine, phenobarbital, phenytoin  gemfibrozil  nefazodone  rifampin  St. John's wort This list may not describe all possible interactions. Give your health care provider a list of all the medicines, herbs, non-prescription drugs, or dietary supplements you use. Also tell them if you smoke, drink alcohol, or use illegal drugs. Some items may interact with your medicine. What should I watch for while using this medicine? Your condition will be monitored carefully while you are receiving this medicine. You will need important blood work done while you are taking this medicine. This medicine can cause serious allergic reactions. To reduce your risk you will need to take other medicine(s) before treatment with this medicine. If you experience allergic reactions like skin rash, itching or hives, swelling of the face, lips, or tongue, tell your doctor or health care professional right away. In some cases, you may be given additional medicines to help with side effects. Follow all directions for their use. This drug may make you feel generally unwell. This is not uncommon, as chemotherapy can affect healthy cells as well as cancer cells. Report any side effects. Continue your course of treatment even though you feel ill unless your doctor tells you to stop. Call your doctor or health care professional for advice if you get a fever, chills or sore throat, or other symptoms of a cold or flu. Do not treat yourself. This drug decreases your body's ability to fight infections. Try to avoid being around people who are sick. This medicine may increase your risk to bruise   or bleed. Call your doctor or health care professional if you notice any unusual bleeding. Be careful brushing and flossing your teeth or using a toothpick because you may get an infection or bleed more easily.  If you have any dental work done, tell your dentist you are receiving this medicine. Avoid taking products that contain aspirin, acetaminophen, ibuprofen, naproxen, or ketoprofen unless instructed by your doctor. These medicines may hide a fever. Do not become pregnant while taking this medicine. Women should inform their doctor if they wish to become pregnant or think they might be pregnant. There is a potential for serious side effects to an unborn child. Talk to your health care professional or pharmacist for more information. Do not breast-feed an infant while taking this medicine. Men are advised not to father a child while receiving this medicine. This product may contain alcohol. Ask your pharmacist or healthcare provider if this medicine contains alcohol. Be sure to tell all healthcare providers you are taking this medicine. Certain medicines, like metronidazole and disulfiram, can cause an unpleasant reaction when taken with alcohol. The reaction includes flushing, headache, nausea, vomiting, sweating, and increased thirst. The reaction can last from 30 minutes to several hours. What side effects may I notice from receiving this medicine? Side effects that you should report to your doctor or health care professional as soon as possible:  allergic reactions like skin rash, itching or hives, swelling of the face, lips, or tongue  breathing problems  changes in vision  fast, irregular heartbeat  high or low blood pressure  mouth sores  pain, tingling, numbness in the hands or feet  signs of decreased platelets or bleeding - bruising, pinpoint red spots on the skin, black, tarry stools, blood in the urine  signs of decreased red blood cells - unusually weak or tired, feeling faint or lightheaded, falls  signs of infection - fever or chills, cough, sore throat, pain or difficulty passing urine  signs and symptoms of liver injury like dark yellow or brown urine; general ill feeling or  flu-like symptoms; light-colored stools; loss of appetite; nausea; right upper belly pain; unusually weak or tired; yellowing of the eyes or skin  swelling of the ankles, feet, hands  unusually slow heartbeat Side effects that usually do not require medical attention (report to your doctor or health care professional if they continue or are bothersome):  diarrhea  hair loss  loss of appetite  muscle or joint pain  nausea, vomiting  pain, redness, or irritation at site where injected  tiredness This list may not describe all possible side effects. Call your doctor for medical advice about side effects. You may report side effects to FDA at 1-800-FDA-1088. Where should I keep my medicine? This drug is given in a hospital or clinic and will not be stored at home. NOTE: This sheet is a summary. It may not cover all possible information. If you have questions about this medicine, talk to your doctor, pharmacist, or health care provider.  2020 Elsevier/Gold Standard (2016-08-26 13:14:55)  

## 2018-11-01 NOTE — Assessment & Plan Note (Signed)
07/05/2018:Screening mammogram detected bilateral breast masses. Diagnostic mammogram and US showed cysts in the right breast and a 1.2cm left breast mass at the 9 o'clock position. 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 Stage Ia Brief course of neoadjuvant tamoxifen therapy started 07/07/2018 08/04/2018: Left lumpectomy:Left lumpectomy ([place]): IDC with intermediate grade DCIS, 1.2cm, grade 1, clear margins, with 3 axillary lymph nodes negative for carcinoma ER 100%, PR 60%, Ki-67 50%  Oncotype DX score: 32, distant recurrence at 9 years with hormone therapy alone: 20%  Treatment plan: 1.Adjuvant chemotherapy with dose dense Adriamycin and Cytoxan x4 followed by Taxol x12 2.Given radiation therapy followed by 3.Adjuvant antiestrogen therapy ----------------------------------------------------------------------------------------------------------------------------- Current treatment: Completed 4 cycles ofdose dense Adriamycin Cytoxan, today cycle 1 Taxol  Chemo toxicities: 1.Mild nausea 2.Severe constipation: Better with laxatives. 3.Chemotherapy-induced anemia: Stable 4.Weight fluctuations: Possibly due to diet changes. I instructed her to cut down the carbohydrates. 5.Severe bone pain especially will start him back and pelvis: Due to G-CSF injection. Ultram and Tylenol appears to be working for her. 6.Fatigue 7.  Acneform rash on the face: Prescribed clindamycin cream 8.  Emotional outburst and tearfulness: Sent prescription for Effexor  Return to clinic in1 week for toxicity check and for cycle 2 

## 2018-11-01 NOTE — Progress Notes (Signed)
Ice was applied to pt's hands & feet during entire taxol infusion.

## 2018-11-02 ENCOUNTER — Telehealth: Payer: Self-pay | Admitting: *Deleted

## 2018-11-02 NOTE — Telephone Encounter (Signed)
Called pt to see how she did with her taxol treatment yest.  She reports doing ok except feeling like a little heart burn today.  She has not taken anything for nausea today.  Encouraged to try to see if this helps how she feels & OK to try tums.  She denies any other symptoms & states she knows how to reach [place] if she has concerns.

## 2018-11-02 NOTE — Telephone Encounter (Signed)
-----   Message from Rolene Course, RN sent at 11/01/2018  2:57 PM EDT ----- Regarding: Lindi Adie 1st tx F/U call taxol 1st Tx F/U call taxol

## 2018-11-08 ENCOUNTER — Other Ambulatory Visit: Payer: Self-pay

## 2018-11-08 ENCOUNTER — Telehealth: Payer: Self-pay

## 2018-11-08 ENCOUNTER — Other Ambulatory Visit: Payer: BC Managed Care – PPO

## 2018-11-08 ENCOUNTER — Other Ambulatory Visit (HOSPITAL_COMMUNITY)
Admission: RE | Admit: 2018-11-08 | Discharge: 2018-11-08 | Disposition: A | Discharge status: Home / Self Care | Payer: BC Managed Care – PPO | Source: Other Acute Inpatient Hospital | Attending: Hematology and Oncology | Admitting: Hematology and Oncology

## 2018-11-08 ENCOUNTER — Ambulatory Visit: Payer: BC Managed Care – PPO

## 2018-11-08 ENCOUNTER — Ambulatory Visit: Payer: BC Managed Care – PPO | Admitting: Hematology and Oncology

## 2018-11-08 DIAGNOSIS — U071 COVID-19: Secondary | ICD-10-CM | POA: Insufficient documentation

## 2018-11-08 DIAGNOSIS — Z20828 Contact with and (suspected) exposure to other viral communicable diseases: Secondary | ICD-10-CM | POA: Diagnosis present

## 2018-11-08 DIAGNOSIS — C50212 Malignant neoplasm of upper-inner quadrant of left female breast: Secondary | ICD-10-CM

## 2018-11-08 DIAGNOSIS — Z20822 Contact with and (suspected) exposure to covid-19: Secondary | ICD-10-CM

## 2018-11-08 LAB — SARS CORONAVIRUS 2 (TAT 6-24 HRS): SARS Coronavirus 2: POSITIVE — AB

## 2018-11-08 NOTE — Telephone Encounter (Signed)
Per MD recommendations appointments canceled, COVID testing set up.  Patient aware.

## 2018-11-09 ENCOUNTER — Telehealth: Payer: Self-pay | Admitting: *Deleted

## 2018-11-09 NOTE — Telephone Encounter (Signed)
Received notification patient tested positive for COVID. Patient has been advised and verbalized an understanding. Patient understands to discuss this result with people she has been in contact with. She states she has only been near her husband. He was tested on Wednesday last week and it was negative. Patient understands that her appointments will be cancelled and rescheduled for 21 days following her positive test result.   Patient verbalized an understanding to report to the local ED with increasing SOB, fever or other COVID symptoms. Patient reports she has a harsh cough but it is manageable at this point.

## 2018-11-15 ENCOUNTER — Other Ambulatory Visit: Payer: BC Managed Care – PPO

## 2018-11-15 ENCOUNTER — Ambulatory Visit: Payer: BC Managed Care – PPO

## 2018-11-15 ENCOUNTER — Encounter: Payer: Self-pay | Admitting: *Deleted

## 2018-11-22 ENCOUNTER — Ambulatory Visit: Payer: BC Managed Care – PPO | Admitting: Hematology and Oncology

## 2018-11-22 ENCOUNTER — Other Ambulatory Visit: Payer: BC Managed Care – PPO

## 2018-11-22 ENCOUNTER — Ambulatory Visit: Payer: BC Managed Care – PPO

## 2018-11-26 ENCOUNTER — Encounter: Payer: Self-pay | Admitting: *Deleted

## 2018-11-29 ENCOUNTER — Other Ambulatory Visit: Payer: BC Managed Care – PPO

## 2018-11-29 ENCOUNTER — Ambulatory Visit: Payer: BC Managed Care – PPO

## 2018-12-05 NOTE — Progress Notes (Signed)
Patient Care Team: Patient, [name] as PCP - General (General Practice) Caitlin Kaufmann, RN as Oncology Nurse Navigator Caitlin Germany, RN as Oncology Nurse Navigator Caitlin Lose, MD as Consulting Physician (Hematology and Oncology) Caitlin Kussmaul, MD as Consulting Physician (General Surgery) Caitlin Gibson, MD as Attending Physician (Radiation Oncology)  DIAGNOSIS:    ICD-10-CM   1. Malignant neoplasm of upper-inner quadrant of left breast in female, estrogen receptor positive ([place])  C50.212    Z17.0     SUMMARY OF ONCOLOGIC HISTORY: Oncology History  Malignant neoplasm of upper-inner quadrant of left breast in female, estrogen receptor positive ([place])  07/05/2018 Initial Diagnosis   Screening mammogram detected bilateral breast masses. Diagnostic mammogram and US showed cysts in the right breast and a 1.2cm left breast mass at the 9 o'clock position. 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    07/07/2018 Cancer Staging   Staging form: Breast, AJCC 8th Edition - Clinical stage from 07/07/2018: Stage IA (cT1c, cN0, cM0, G2, ER+, PR+, HER2-) - Signed by Caitlin Lose, MD on 07/07/2018    Genetic Testing   Negative testing. No pathogenic variants identified on the [employer] Common Hereditary Cancers Panel. The Common Hereditary Cancers Panel offered by [employer] includes sequencing and/or deletion duplication testing of the following 47 genes: APC, ATM, AXIN2, BARD1, BMPR1A, BRCA1, BRCA2, BRIP1, CDH1, CDKN2A (p14ARF), CDKN2A (p16INK4a), CKD4, CHEK2, CTNNA1, DICER1, EPCAM (Deletion/duplication testing only), GREM1 (promoter region deletion/duplication testing only), KIT, MEN1, MLH1, MSH2, MSH3, MSH6, MUTYH, NBN, NF1, NHTL1, PALB2, PDGFRA, PMS2, POLD1, POLE, PTEN, RAD50, RAD51C, RAD51D, SDHB, SDHC, SDHD, SMAD4, SMARCA4. STK11, TP53, TSC1, TSC2, and VHL.  The following genes were evaluated for sequence changes only: SDHA and HOXB13 c.251G>A variant only. The  report date is 07/18/2018.   08/04/2018 Surgery   Left lumpectomy Caitlin Graham): IDC with intermediate grade DCIS, 1.2cm, grade 1, clear margins, with 3 axillary lymph nodes negative for carcinoma ER 100%, PR 60%, Ki-67 50%   09/07/2018 -  Chemotherapy   The patient had DOXOrubicin (ADRIAMYCIN) chemo injection 146 mg, 60 mg/m2 = 146 mg, Intravenous,  Once, 4 of 4 cycles Administration: 146 mg (09/07/2018), 146 mg (09/20/2018), 146 mg (10/04/2018), 146 mg (10/18/2018) palonosetron (ALOXI) injection 0.25 mg, 0.25 mg, Intravenous,  Once, 4 of 4 cycles Administration: 0.25 mg (09/07/2018), 0.25 mg (09/20/2018), 0.25 mg (10/04/2018), 0.25 mg (10/18/2018) pegfilgrastim-jmdb (FULPHILA) injection 6 mg, 6 mg, Subcutaneous,  Once, 4 of 4 cycles Administration: 6 mg (09/09/2018), 6 mg (09/22/2018), 6 mg (10/06/2018), 6 mg (10/20/2018) cyclophosphamide (CYTOXAN) 1,460 mg in sodium chloride 0.9 % 250 mL chemo infusion, 600 mg/m2 = 1,460 mg, Intravenous,  Once, 4 of 4 cycles Administration: 1,460 mg (09/07/2018), 1,460 mg (09/20/2018), 1,460 mg (10/04/2018), 1,460 mg (10/18/2018) PACLitaxel (TAXOL) 198 mg in sodium chloride 0.9 % 250 mL chemo infusion (</= '80mg'$ /m2), 80 mg/m2 = 198 mg, Intravenous,  Once, 2 of 12 cycles Administration: 198 mg (11/01/2018) fosaprepitant (EMEND) 150 mg, dexamethasone (DECADRON) 12 mg in sodium chloride 0.9 % 145 mL IVPB, , Intravenous,  Once, 4 of 4 cycles Administration:  (09/07/2018),  (09/20/2018),  (10/04/2018),  (10/18/2018)  for chemotherapy treatment.      CHIEF COMPLIANT: Cycle 2 Taxol  INTERVAL HISTORY: Caitlin Graham is a 53 y.o. with above-mentioned history of left breast cancer who underwent a lumpectomyand is currently onadjuvant chemotherapy treatment. She completed 4 cycles of dense Adriamycin and Cytoxan and is currently receiving weekly Taxol.She tested positive for COVID-19  on 11/08/18 so treatment has been delayed. She presents to the clinic todayforcycle2.  She has recovered  completely from COVID-19 symptoms.  Her worst symptoms are related to smell and taste.  REVIEW OF SYSTEMS:   Constitutional: Denies fevers, chills or abnormal weight loss Eyes: Denies blurriness of vision Ears, nose, mouth, throat, and face: Denies mucositis or sore throat Respiratory: Denies cough, dyspnea or wheezes Cardiovascular: Denies palpitation, chest discomfort Gastrointestinal: Denies nausea, heartburn or change in bowel habits Skin: Denies abnormal skin rashes Lymphatics: Denies new lymphadenopathy or easy bruising Neurological: Denies numbness, tingling or new weaknesses Behavioral/Psych: Mood is stable, no new changes  Extremities: No lower extremity edema Breast: denies any pain or lumps or nodules in either breasts All other systems were reviewed with the patient and are negative.  I have reviewed the past medical history, past surgical history, social history and family history with the patient and they are unchanged from previous note.  ALLERGIES:  is allergic to aspirin; ibuprofen; hydrocodone; oxycodone; and phenergan [promethazine].  MEDICATIONS:  Current Outpatient Medications  Medication Sig Dispense Refill  . acetaminophen (TYLENOL) 325 MG tablet Take 650 mg by mouth every 6 (six) hours as needed.    . clindamycin (CLINDAGEL) 1 % gel Apply topically 2 (two) times daily. 30 g 0  . lidocaine-prilocaine (EMLA) cream Apply to affected area once 30 g 3  . LORazepam (ATIVAN) 0.5 MG tablet Take 1 tablet (0.5 mg total) by mouth at bedtime as needed for sleep. 30 tablet 0  . ondansetron (ZOFRAN) 8 MG tablet Take 1 tablet (8 mg total) by mouth 2 (two) times daily as needed. Start on the third day after chemotherapy. 30 tablet 1  . prochlorperazine (COMPAZINE) 10 MG tablet Take 1 tablet (10 mg total) by mouth every 6 (six) hours as needed (Nausea or vomiting). 30 tablet 1  . traMADol (ULTRAM) 50 MG tablet Take 1 tablet (50 mg total) by mouth every 6 (six) hours as needed. 60  tablet 0  . venlafaxine XR (EFFEXOR XR) 37.5 MG 24 hr capsule Take 1 capsule (37.5 mg total) by mouth daily with breakfast. 30 capsule 3   No current facility-administered medications for this visit.     PHYSICAL EXAMINATION: ECOG PERFORMANCE STATUS: 1 - Symptomatic but completely ambulatory  Vitals:   12/06/18 0940  BP: 106/62  Pulse: 73  Resp: 18  Temp: 97.8 F (36.6 C)  SpO2: 99%   Filed Weights   12/06/18 0940  Weight: 275 lb 3.2 oz (124.8 kg)    GENERAL: alert, no distress and comfortable SKIN: skin color, texture, turgor are normal, no rashes or significant lesions EYES: normal, Conjunctiva are pink and non-injected, sclera clear OROPHARYNX: no exudate, no erythema and lips, buccal mucosa, and tongue normal  NECK: supple, thyroid normal size, non-tender, without nodularity LYMPH: no palpable lymphadenopathy in the cervical, axillary or inguinal LUNGS: clear to auscultation and percussion with normal breathing effort HEART: regular rate & rhythm and no murmurs and no lower extremity edema ABDOMEN: abdomen soft, non-tender and normal bowel sounds MUSCULOSKELETAL: no cyanosis of digits and no clubbing  NEURO: alert & oriented x 3 with fluent speech, no focal motor/sensory deficits EXTREMITIES: No lower extremity edema  LABORATORY DATA:  I have reviewed the data as listed CMP Latest Ref Rng & Units 12/06/2018 11/01/2018 10/18/2018  Glucose 70 - 99 mg/dL 94 101(H) 102(H)  BUN 6 - 20 mg/dL '11 11 10  '$ Creatinine 0.44 - 1.00 mg/dL 0.80 0.75 0.71  Sodium 135 -  145 mmol/L 140 140 142  Potassium 3.5 - 5.1 mmol/L 4.3 4.0 3.9  Chloride 98 - 111 mmol/L 106 105 106  CO2 22 - 32 mmol/L '22 24 25  '$ Calcium 8.9 - 10.3 mg/dL 9.1 9.1 8.8(L)  Total Protein 6.5 - 8.1 g/dL 6.8 6.6 6.4(L)  Total Bilirubin 0.3 - 1.2 mg/dL 0.5 0.4 0.3  Alkaline Phos 38 - 126 U/L 88 102 104  AST 15 - 41 U/L 21 12(L) 13(L)  ALT 0 - 44 U/L '28 17 15    '$ Lab Results  Component Value Date   WBC 6.2  12/06/2018   HGB 11.4 (L) 12/06/2018   HCT 35.3 (L) 12/06/2018   MCV 99.2 12/06/2018   PLT 191 12/06/2018   NEUTROABS 3.8 12/06/2018    ASSESSMENT & PLAN:  Malignant neoplasm of upper-inner quadrant of left breast in female, estrogen receptor positive ([place]) 07/05/2018:Screening mammogram detected bilateral breast masses. Diagnostic mammogram and US showed cysts in the right breast and a 1.2cm left breast mass at the 9 o'clock position. 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 Stage Ia Brief course of neoadjuvant tamoxifen therapy started 07/07/2018 08/04/2018: Left lumpectomy:Left lumpectomy Caitlin Graham): IDC with intermediate grade DCIS, 1.2cm, grade 1, clear margins, with 3 axillary lymph nodes negative for carcinoma ER 100%, PR 60%, Ki-67 50%  Oncotype DX score: 32, distant recurrence at 9 years with hormone therapy alone: 20%  Treatment plan: 1.Adjuvant chemotherapy with dose dense Adriamycin and Cytoxan x4 followed by Taxol x12 2.Given radiation therapy followed by 3.Adjuvant antiestrogen therapy ----------------------------------------------------------------------------------------------------------------------------- Current treatment: Completed 4 cycles ofdose dense Adriamycin Cytoxan, today cycle 2 Taxol  Chemo toxicities: 1.Chemotherapy-induced anemia:Stable 2.Fatigue 3. Emotional outburst and tearfulness: Currently on Effexor 37.5 mg.  We may increase it if necessary. 4.  COVID-19 positivity after first cycle of Taxol.  We held chemotherapy.  Today she is able to resume it.  Patient is worried about going to school but she is also missing school and wants to go back.  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  Return to clinic in1 week for toxicity check and for cycle 2    No orders of the defined types were placed in this encounter.  The patient has a good understanding of the overall plan. she agrees  with it. she will call with any problems that may develop before the next visit here.  Caitlin Lose, MD 12/06/2018  Julious Oka Dorshimer, am acting as scribe for Dr. Nicholas Graham.  I have reviewed the above documentation for accuracy and completeness, and I agree with the above.

## 2018-12-06 ENCOUNTER — Inpatient Hospital Stay: Payer: BC Managed Care – PPO | Attending: Hematology and Oncology

## 2018-12-06 ENCOUNTER — Inpatient Hospital Stay: Payer: BC Managed Care – PPO

## 2018-12-06 ENCOUNTER — Encounter: Payer: Self-pay | Admitting: *Deleted

## 2018-12-06 ENCOUNTER — Other Ambulatory Visit: Payer: Self-pay | Admitting: Hematology and Oncology

## 2018-12-06 ENCOUNTER — Other Ambulatory Visit: Payer: Self-pay

## 2018-12-06 ENCOUNTER — Inpatient Hospital Stay (HOSPITAL_BASED_OUTPATIENT_CLINIC_OR_DEPARTMENT_OTHER): Payer: BC Managed Care – PPO | Admitting: Hematology and Oncology

## 2018-12-06 DIAGNOSIS — C50212 Malignant neoplasm of upper-inner quadrant of left female breast: Secondary | ICD-10-CM | POA: Diagnosis not present

## 2018-12-06 DIAGNOSIS — Z95828 Presence of other vascular implants and grafts: Secondary | ICD-10-CM

## 2018-12-06 DIAGNOSIS — D6481 Anemia due to antineoplastic chemotherapy: Secondary | ICD-10-CM | POA: Insufficient documentation

## 2018-12-06 DIAGNOSIS — Z170 Estrogen receptor positive status [ER+]: Secondary | ICD-10-CM

## 2018-12-06 DIAGNOSIS — Z5111 Encounter for antineoplastic chemotherapy: Secondary | ICD-10-CM | POA: Diagnosis not present

## 2018-12-06 LAB — CMP (CANCER CENTER ONLY)
ALT: 28 U/L (ref 0–44)
AST: 21 U/L (ref 15–41)
Albumin: 3.5 g/dL (ref 3.5–5.0)
Alkaline Phosphatase: 88 U/L (ref 38–126)
Anion gap: 12 (ref 5–15)
BUN: 11 mg/dL (ref 6–20)
CO2: 22 mmol/L (ref 22–32)
Calcium: 9.1 mg/dL (ref 8.9–10.3)
Chloride: 106 mmol/L (ref 98–111)
Creatinine: 0.8 mg/dL (ref 0.44–1.00)
GFR, Est AFR Am: >60 mL/min (ref >60)
GFR, Estimated: >60 mL/min (ref >60)
Glucose, Bld: 94 mg/dL (ref 70–99)
Potassium: 4.3 mmol/L (ref 3.5–5.1)
Sodium: 140 mmol/L (ref 135–145)
Total Bilirubin: 0.5 mg/dL (ref 0.3–1.2)
Total Protein: 6.8 g/dL (ref 6.5–8.1)

## 2018-12-06 LAB — CBC WITH DIFFERENTIAL (CANCER CENTER ONLY)
Abs Immature Granulocytes: 0.04 10*3/uL (ref 0.00–0.07)
Basophils Absolute: 0.1 10*3/uL (ref 0.0–0.1)
Basophils Relative: 1 %
Eosinophils Absolute: 0.3 10*3/uL (ref 0.0–0.5)
Eosinophils Relative: 5 %
HCT: 35.3 % — ABNORMAL LOW (ref 36.0–46.0)
Hemoglobin: 11.4 g/dL — ABNORMAL LOW (ref 12.0–15.0)
Immature Granulocytes: 1 %
Lymphocytes Relative: 19 %
Lymphs Abs: 1.2 10*3/uL (ref 0.7–4.0)
MCH: 32 pg (ref 26.0–34.0)
MCHC: 32.3 g/dL (ref 30.0–36.0)
MCV: 99.2 fL (ref 80.0–100.0)
Monocytes Absolute: 0.8 10*3/uL (ref 0.1–1.0)
Monocytes Relative: 13 %
Neutro Abs: 3.8 10*3/uL (ref 1.7–7.7)
Neutrophils Relative %: 61 %
Platelet Count: 191 10*3/uL (ref 150–400)
RBC: 3.56 MIL/uL — ABNORMAL LOW (ref 3.87–5.11)
RDW: 17.2 % — ABNORMAL HIGH (ref 11.5–15.5)
WBC Count: 6.2 10*3/uL (ref 4.0–10.5)
nRBC: 0 % (ref 0.0–0.2)

## 2018-12-06 MED ORDER — SODIUM CHLORIDE 0.9% FLUSH
10.0000 mL | INTRAVENOUS | Status: DC | PRN
  Administered 2018-12-06: 10 mL
  Filled 2018-12-06: qty 10

## 2018-12-06 MED ORDER — SODIUM CHLORIDE 0.9 % IV SOLN
Freq: Once | INTRAVENOUS | Status: AC
  Administered 2018-12-06: 10:00:00 via INTRAVENOUS
  Filled 2018-12-06: qty 250

## 2018-12-06 MED ORDER — HEPARIN SOD (PORK) LOCK FLUSH 100 UNIT/ML IV SOLN
500.0000 [IU] | Freq: Once | INTRAVENOUS | Status: AC | PRN
  Administered 2018-12-06: 500 [IU]
  Filled 2018-12-06: qty 5

## 2018-12-06 MED ORDER — FAMOTIDINE IN NACL 20-0.9 MG/50ML-% IV SOLN
20.0000 mg | Freq: Once | INTRAVENOUS | Status: AC
  Administered 2018-12-06: 20 mg via INTRAVENOUS

## 2018-12-06 MED ORDER — SODIUM CHLORIDE 0.9 % IV SOLN
20.0000 mg | Freq: Once | INTRAVENOUS | Status: AC
  Administered 2018-12-06: 20 mg via INTRAVENOUS
  Filled 2018-12-06: qty 2

## 2018-12-06 MED ORDER — DIPHENHYDRAMINE HCL 50 MG/ML IJ SOLN
INTRAMUSCULAR | Status: AC
  Filled 2018-12-06: qty 1

## 2018-12-06 MED ORDER — DIPHENHYDRAMINE HCL 50 MG/ML IJ SOLN
50.0000 mg | Freq: Once | INTRAMUSCULAR | Status: AC
  Administered 2018-12-06: 50 mg via INTRAVENOUS

## 2018-12-06 MED ORDER — SODIUM CHLORIDE 0.9 % IV SOLN
80.0000 mg/m2 | Freq: Once | INTRAVENOUS | Status: AC
  Administered 2018-12-06: 198 mg via INTRAVENOUS
  Filled 2018-12-06: qty 33

## 2018-12-06 MED ORDER — FAMOTIDINE IN NACL 20-0.9 MG/50ML-% IV SOLN
INTRAVENOUS | Status: AC
  Filled 2018-12-06: qty 50

## 2018-12-06 NOTE — Patient Instructions (Signed)
Paclitaxel injection What is this medicine? PACLITAXEL (PAK li TAX el) is a chemotherapy drug. It targets fast dividing cells, like cancer cells, and causes these cells to die. This medicine is used to treat ovarian cancer, breast cancer, lung cancer, Kaposi's sarcoma, and other cancers. This medicine may be used for other purposes; ask your health care provider or pharmacist if you have questions. COMMON BRAND NAME(S): Onxol, Taxol What should I tell my health care provider before I take this medicine? They need to know if you have any of these conditions:  history of irregular heartbeat  liver disease  low blood counts, like low white cell, platelet, or red cell counts  lung or breathing disease, like asthma  tingling of the fingers or toes, or other nerve disorder  an unusual or allergic reaction to paclitaxel, alcohol, polyoxyethylated castor oil, other chemotherapy, other medicines, foods, dyes, or preservatives  pregnant or trying to get pregnant  breast-feeding How should I use this medicine? This drug is given as an infusion into a vein. It is administered in a hospital or clinic by a specially trained health care professional. Talk to your pediatrician regarding the use of this medicine in children. Special care may be needed. Overdosage: If you think you have taken too much of this medicine contact a poison control center or emergency room at once. NOTE: This medicine is only for you. Do not share this medicine with others. What if I miss a dose? It is important not to miss your dose. Call your doctor or health care professional if you are unable to keep an appointment. What may interact with this medicine? Do not take this medicine with any of the following medications:  disulfiram  metronidazole This medicine may also interact with the following medications:  antiviral medicines for hepatitis, HIV or AIDS  certain antibiotics like erythromycin and  clarithromycin  certain medicines for fungal infections like ketoconazole and itraconazole  certain medicines for seizures like carbamazepine, phenobarbital, phenytoin  gemfibrozil  nefazodone  rifampin  St. John's wort This list may not describe all possible interactions. Give your health care provider a list of all the medicines, herbs, non-prescription drugs, or dietary supplements you use. Also tell them if you smoke, drink alcohol, or use illegal drugs. Some items may interact with your medicine. What should I watch for while using this medicine? Your condition will be monitored carefully while you are receiving this medicine. You will need important blood work done while you are taking this medicine. This medicine can cause serious allergic reactions. To reduce your risk you will need to take other medicine(s) before treatment with this medicine. If you experience allergic reactions like skin rash, itching or hives, swelling of the face, lips, or tongue, tell your doctor or health care professional right away. In some cases, you may be given additional medicines to help with side effects. Follow all directions for their use. This drug may make you feel generally unwell. This is not uncommon, as chemotherapy can affect healthy cells as well as cancer cells. Report any side effects. Continue your course of treatment even though you feel ill unless your doctor tells you to stop. Call your doctor or health care professional for advice if you get a fever, chills or sore throat, or other symptoms of a cold or flu. Do not treat yourself. This drug decreases your body's ability to fight infections. Try to avoid being around people who are sick. This medicine may increase your risk to bruise   or bleed. Call your doctor or health care professional if you notice any unusual bleeding. Be careful brushing and flossing your teeth or using a toothpick because you may get an infection or bleed more easily.  If you have any dental work done, tell your dentist you are receiving this medicine. Avoid taking products that contain aspirin, acetaminophen, ibuprofen, naproxen, or ketoprofen unless instructed by your doctor. These medicines may hide a fever. Do not become pregnant while taking this medicine. Women should inform their doctor if they wish to become pregnant or think they might be pregnant. There is a potential for serious side effects to an unborn child. Talk to your health care professional or pharmacist for more information. Do not breast-feed an infant while taking this medicine. Men are advised not to father a child while receiving this medicine. This product may contain alcohol. Ask your pharmacist or healthcare provider if this medicine contains alcohol. Be sure to tell all healthcare providers you are taking this medicine. Certain medicines, like metronidazole and disulfiram, can cause an unpleasant reaction when taken with alcohol. The reaction includes flushing, headache, nausea, vomiting, sweating, and increased thirst. The reaction can last from 30 minutes to several hours. What side effects may I notice from receiving this medicine? Side effects that you should report to your doctor or health care professional as soon as possible:  allergic reactions like skin rash, itching or hives, swelling of the face, lips, or tongue  breathing problems  changes in vision  fast, irregular heartbeat  high or low blood pressure  mouth sores  pain, tingling, numbness in the hands or feet  signs of decreased platelets or bleeding - bruising, pinpoint red spots on the skin, black, tarry stools, blood in the urine  signs of decreased red blood cells - unusually weak or tired, feeling faint or lightheaded, falls  signs of infection - fever or chills, cough, sore throat, pain or difficulty passing urine  signs and symptoms of liver injury like dark yellow or brown urine; general ill feeling or  flu-like symptoms; light-colored stools; loss of appetite; nausea; right upper belly pain; unusually weak or tired; yellowing of the eyes or skin  swelling of the ankles, feet, hands  unusually slow heartbeat Side effects that usually do not require medical attention (report to your doctor or health care professional if they continue or are bothersome):  diarrhea  hair loss  loss of appetite  muscle or joint pain  nausea, vomiting  pain, redness, or irritation at site where injected  tiredness This list may not describe all possible side effects. Call your doctor for medical advice about side effects. You may report side effects to FDA at 1-800-FDA-1088. Where should I keep my medicine? This drug is given in a hospital or clinic and will not be stored at home. NOTE: This sheet is a summary. It may not cover all possible information. If you have questions about this medicine, talk to your doctor, pharmacist, or health care provider.  2020 Elsevier/Gold Standard (2016-08-26 13:14:55)  

## 2018-12-06 NOTE — Assessment & Plan Note (Signed)
07/05/2018:Screening mammogram detected bilateral breast masses. Diagnostic mammogram and US showed cysts in the right breast and a 1.2cm left breast mass at the 9 o'clock position. 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 Stage Ia Brief course of neoadjuvant tamoxifen therapy started 07/07/2018 08/04/2018: Left lumpectomy:Left lumpectomy [name]): IDC with intermediate grade DCIS, 1.2cm, grade 1, clear margins, with 3 axillary lymph nodes negative for carcinoma ER 100%, PR 60%, Ki-67 50%  Oncotype DX score: 32, distant recurrence at 9 years with hormone therapy alone: 20%  Treatment plan: 1.Adjuvant chemotherapy with dose dense Adriamycin and Cytoxan x4 followed by Taxol x12 2.Given radiation therapy followed by 3.Adjuvant antiestrogen therapy ----------------------------------------------------------------------------------------------------------------------------- Current treatment: Completed 4 cycles ofdose dense Adriamycin Cytoxan, today cycle 2 Taxol  Chemo toxicities: 1.Chemotherapy-induced anemia:Stable 2.Fatigue 3. Emotional outburst and tearfulness: Currently on Effexor 37.5 mg.  We may increase it if necessary. 4.  COVID-19 positivity after first cycle of Taxol.  We held chemotherapy.  Today she is able to resume it.  Patient is worried about going to school but she is also missing school and wants to go back.  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    Return to clinic in1 week for toxicity check and for cycle 2

## 2018-12-13 ENCOUNTER — Inpatient Hospital Stay: Payer: BC Managed Care – PPO

## 2018-12-13 ENCOUNTER — Other Ambulatory Visit: Payer: Self-pay

## 2018-12-13 ENCOUNTER — Other Ambulatory Visit: Payer: Self-pay | Admitting: Hematology and Oncology

## 2018-12-13 ENCOUNTER — Inpatient Hospital Stay: Payer: BC Managed Care – PPO | Attending: Hematology and Oncology

## 2018-12-13 VITALS — BP 97/44 | Temp 98.2°F | Resp 16 | Ht 65.5 in | Wt 275.8 lb

## 2018-12-13 DIAGNOSIS — Z5111 Encounter for antineoplastic chemotherapy: Secondary | ICD-10-CM | POA: Diagnosis not present

## 2018-12-13 DIAGNOSIS — D6481 Anemia due to antineoplastic chemotherapy: Secondary | ICD-10-CM | POA: Diagnosis not present

## 2018-12-13 DIAGNOSIS — C50212 Malignant neoplasm of upper-inner quadrant of left female breast: Secondary | ICD-10-CM

## 2018-12-13 DIAGNOSIS — Z170 Estrogen receptor positive status [ER+]: Secondary | ICD-10-CM | POA: Insufficient documentation

## 2018-12-13 LAB — CMP (CANCER CENTER ONLY)
ALT: 53 U/L — ABNORMAL HIGH (ref 0–44)
AST: 43 U/L — ABNORMAL HIGH (ref 15–41)
Albumin: 3.4 g/dL — ABNORMAL LOW (ref 3.5–5.0)
Alkaline Phosphatase: 120 U/L (ref 38–126)
Anion gap: 9 (ref 5–15)
BUN: 12 mg/dL (ref 6–20)
CO2: 25 mmol/L (ref 22–32)
Calcium: 8.7 mg/dL — ABNORMAL LOW (ref 8.9–10.3)
Chloride: 106 mmol/L (ref 98–111)
Creatinine: 0.77 mg/dL (ref 0.44–1.00)
GFR, Est AFR Am: >60 mL/min (ref >60)
GFR, Estimated: >60 mL/min (ref >60)
Glucose, Bld: 78 mg/dL (ref 70–99)
Potassium: 3.8 mmol/L (ref 3.5–5.1)
Sodium: 140 mmol/L (ref 135–145)
Total Bilirubin: 0.4 mg/dL (ref 0.3–1.2)
Total Protein: 6.6 g/dL (ref 6.5–8.1)

## 2018-12-13 LAB — CBC WITH DIFFERENTIAL (CANCER CENTER ONLY)
Abs Immature Granulocytes: 0.05 10*3/uL (ref 0.00–0.07)
Basophils Absolute: 0 10*3/uL (ref 0.0–0.1)
Basophils Relative: 1 %
Eosinophils Absolute: 0.3 10*3/uL (ref 0.0–0.5)
Eosinophils Relative: 7 %
HCT: 33.5 % — ABNORMAL LOW (ref 36.0–46.0)
Hemoglobin: 10.9 g/dL — ABNORMAL LOW (ref 12.0–15.0)
Immature Granulocytes: 1 %
Lymphocytes Relative: 22 %
Lymphs Abs: 0.9 10*3/uL (ref 0.7–4.0)
MCH: 31.5 pg (ref 26.0–34.0)
MCHC: 32.5 g/dL (ref 30.0–36.0)
MCV: 96.8 fL (ref 80.0–100.0)
Monocytes Absolute: 0.2 10*3/uL (ref 0.1–1.0)
Monocytes Relative: 5 %
Neutro Abs: 2.6 10*3/uL (ref 1.7–7.7)
Neutrophils Relative %: 64 %
Platelet Count: 171 10*3/uL (ref 150–400)
RBC: 3.46 MIL/uL — ABNORMAL LOW (ref 3.87–5.11)
RDW: 16.4 % — ABNORMAL HIGH (ref 11.5–15.5)
WBC Count: 4 10*3/uL (ref 4.0–10.5)
nRBC: 0 % (ref 0.0–0.2)

## 2018-12-13 MED ORDER — DIPHENHYDRAMINE HCL 50 MG/ML IJ SOLN
INTRAMUSCULAR | Status: AC
  Filled 2018-12-13: qty 1

## 2018-12-13 MED ORDER — SODIUM CHLORIDE 0.9% FLUSH
10.0000 mL | INTRAVENOUS | Status: DC | PRN
  Administered 2018-12-13: 10 mL
  Filled 2018-12-13: qty 10

## 2018-12-13 MED ORDER — SODIUM CHLORIDE 0.9 % IV SOLN
80.0000 mg/m2 | Freq: Once | INTRAVENOUS | Status: AC
  Administered 2018-12-13: 198 mg via INTRAVENOUS
  Filled 2018-12-13: qty 33

## 2018-12-13 MED ORDER — DIPHENHYDRAMINE HCL 50 MG/ML IJ SOLN
25.0000 mg | Freq: Once | INTRAMUSCULAR | Status: AC
  Administered 2018-12-13: 25 mg via INTRAVENOUS

## 2018-12-13 MED ORDER — TRAMADOL HCL 50 MG PO TABS: 50.0000 mg | ORAL_TABLET | Freq: Four times a day (QID) | ORAL | 0 refills | Status: DC | PRN

## 2018-12-13 MED ORDER — FAMOTIDINE IN NACL 20-0.9 MG/50ML-% IV SOLN
INTRAVENOUS | Status: AC
  Filled 2018-12-13: qty 50

## 2018-12-13 MED ORDER — FAMOTIDINE IN NACL 20-0.9 MG/50ML-% IV SOLN
20.0000 mg | Freq: Once | INTRAVENOUS | Status: AC
  Administered 2018-12-13: 20 mg via INTRAVENOUS

## 2018-12-13 MED ORDER — HEPARIN SOD (PORK) LOCK FLUSH 100 UNIT/ML IV SOLN
500.0000 [IU] | Freq: Once | INTRAVENOUS | Status: AC | PRN
  Administered 2018-12-13: 500 [IU]
  Filled 2018-12-13: qty 5

## 2018-12-13 MED ORDER — SODIUM CHLORIDE 0.9 % IV SOLN
Freq: Once | INTRAVENOUS | Status: AC
  Administered 2018-12-13: 10:00:00 via INTRAVENOUS
  Filled 2018-12-13: qty 250

## 2018-12-13 MED ORDER — SODIUM CHLORIDE 0.9 % IV SOLN
20.0000 mg | Freq: Once | INTRAVENOUS | Status: AC
  Administered 2018-12-13: 20 mg via INTRAVENOUS
  Filled 2018-12-13: qty 20

## 2018-12-13 NOTE — Patient Instructions (Signed)
[facility] Discharge Instructions for Patients Receiving Chemotherapy  Today you received the following chemotherapy agents :  Taxol.  To help prevent nausea and vomiting after your treatment, we encourage you to take your nausea medication as prescribed.   If you develop nausea and vomiting that is not controlled by your nausea medication, call the clinic.   BELOW ARE SYMPTOMS THAT SHOULD BE REPORTED IMMEDIATELY:  *FEVER GREATER THAN 100.5 F  *CHILLS WITH OR WITHOUT FEVER  NAUSEA AND VOMITING THAT IS NOT CONTROLLED WITH YOUR NAUSEA MEDICATION  *UNUSUAL SHORTNESS OF BREATH  *UNUSUAL BRUISING OR BLEEDING  TENDERNESS IN MOUTH AND THROAT WITH OR WITHOUT PRESENCE OF ULCERS  *URINARY PROBLEMS  *BOWEL PROBLEMS  UNUSUAL RASH Items with * indicate a potential emergency and should be followed up as soon as possible.  Feel free to call the clinic should you have any questions or concerns. The clinic phone number is [phone].  Please show the CHEMO ALERT CARD at check-in to the Emergency Department and triage nurse.   

## 2018-12-19 NOTE — Progress Notes (Signed)
Patient Care Team: Patient, [name] as PCP - General (General Practice) Caitlin Kaufmann, RN as Oncology Nurse Navigator Caitlin Germany, RN as Oncology Nurse Navigator Caitlin Lose, MD as Consulting Physician (Hematology and Oncology) Caitlin Kussmaul, MD as Consulting Physician (General Surgery) Caitlin Gibson, MD as Attending Physician (Radiation Oncology)  DIAGNOSIS:    ICD-10-CM   1. Malignant neoplasm of upper-inner quadrant of left breast in female, estrogen receptor positive ([place])  C50.212    Z17.0     SUMMARY OF ONCOLOGIC HISTORY: Oncology History  Malignant neoplasm of upper-inner quadrant of left breast in female, estrogen receptor positive ([place])  07/05/2018 Initial Diagnosis   Screening mammogram detected bilateral breast masses. Diagnostic mammogram and US showed cysts in the right breast and a 1.2cm left breast mass at the 9 o'clock position. 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    07/07/2018 Cancer Staging   Staging form: Breast, AJCC 8th Edition - Clinical stage from 07/07/2018: Stage IA (cT1c, cN0, cM0, G2, ER+, PR+, HER2-) - Signed by Caitlin Lose, MD on 07/07/2018    Genetic Testing   Negative testing. No pathogenic variants identified on the [employer] Common Hereditary Cancers Panel. The Common Hereditary Cancers Panel offered by [facility] includes sequencing and/or deletion duplication testing of the following 47 genes: APC, ATM, AXIN2, BARD1, BMPR1A, BRCA1, BRCA2, BRIP1, CDH1, CDKN2A (p14ARF), CDKN2A (p16INK4a), CKD4, CHEK2, CTNNA1, DICER1, EPCAM (Deletion/duplication testing only), GREM1 (promoter region deletion/duplication testing only), KIT, MEN1, MLH1, MSH2, MSH3, MSH6, MUTYH, NBN, NF1, NHTL1, PALB2, PDGFRA, PMS2, POLD1, POLE, PTEN, RAD50, RAD51C, RAD51D, SDHB, SDHC, SDHD, SMAD4, SMARCA4. STK11, TP53, TSC1, TSC2, and VHL.  The following genes were evaluated for sequence changes only: SDHA and HOXB13 c.251G>A variant only. The  report date is 07/18/2018.   08/04/2018 Surgery   Left lumpectomy Caitlin Graham): IDC with intermediate grade DCIS, 1.2cm, grade 1, clear margins, with 3 axillary lymph nodes negative for carcinoma ER 100%, PR 60%, Ki-67 50%   09/07/2018 -  Chemotherapy   The patient had DOXOrubicin (ADRIAMYCIN) chemo injection 146 mg, 60 mg/m2 = 146 mg, Intravenous,  Once, 4 of 4 cycles Administration: 146 mg (09/07/2018), 146 mg (09/20/2018), 146 mg (10/04/2018), 146 mg (10/18/2018) palonosetron (ALOXI) injection 0.25 mg, 0.25 mg, Intravenous,  Once, 4 of 4 cycles Administration: 0.25 mg (09/07/2018), 0.25 mg (09/20/2018), 0.25 mg (10/04/2018), 0.25 mg (10/18/2018) pegfilgrastim-jmdb (FULPHILA) injection 6 mg, 6 mg, Subcutaneous,  Once, 4 of 4 cycles Administration: 6 mg (09/09/2018), 6 mg (09/22/2018), 6 mg (10/06/2018), 6 mg (10/20/2018) cyclophosphamide (CYTOXAN) 1,460 mg in sodium chloride 0.9 % 250 mL chemo infusion, 600 mg/m2 = 1,460 mg, Intravenous,  Once, 4 of 4 cycles Administration: 1,460 mg (09/07/2018), 1,460 mg (09/20/2018), 1,460 mg (10/04/2018), 1,460 mg (10/18/2018) PACLitaxel (TAXOL) 198 mg in sodium chloride 0.9 % 250 mL chemo infusion (</= '80mg'$ /m2), 80 mg/m2 = 198 mg, Intravenous,  Once, 3 of 12 cycles Administration: 198 mg (11/01/2018), 198 mg (12/06/2018), 198 mg (12/13/2018) fosaprepitant (EMEND) 150 mg, dexamethasone (DECADRON) 12 mg in sodium chloride 0.9 % 145 mL IVPB, , Intravenous,  Once, 4 of 4 cycles Administration:  (09/07/2018),  (09/20/2018),  (10/04/2018),  (10/18/2018)  for chemotherapy treatment.      CHIEF COMPLIANT: Cycle 4 Taxol  INTERVAL HISTORY: Caitlin Graham is a 53 y.o. with above-mentioned history of left breast cancer who underwent a lumpectomyand is currently onadjuvant chemotherapy treatment. She completed 4 cycles ofdense Adriamycin and Cytoxanand is currently receiving weekly Taxol.She  presents to the clinic todayforcycle4.  She did much better after the last cycle of chemo.  We  reduced the dosage without treatment.  REVIEW OF SYSTEMS:   Constitutional: Denies fevers, chills or abnormal weight loss Eyes: Denies blurriness of vision Ears, nose, mouth, throat, and face: Denies mucositis or sore throat Respiratory: Denies cough, dyspnea or wheezes Cardiovascular: Denies palpitation, chest discomfort Gastrointestinal: Denies nausea, heartburn or change in bowel habits Skin: Denies abnormal skin rashes Lymphatics: Denies new lymphadenopathy or easy bruising Neurological: Left with mild peripheral neuropathy Behavioral/Psych: Mood is stable, no new changes  Extremities: No lower extremity edema Breast: denies any pain or lumps or nodules in either breasts All other systems were reviewed with the patient and are negative.  I have reviewed the past medical history, past surgical history, social history and family history with the patient and they are unchanged from previous note.  ALLERGIES:  is allergic to aspirin; ibuprofen; hydrocodone; oxycodone; and phenergan [promethazine].  MEDICATIONS:  Current Outpatient Medications  Medication Sig Dispense Refill  . acetaminophen (TYLENOL) 325 MG tablet Take 650 mg by mouth every 6 (six) hours as needed.    . clindamycin (CLINDAGEL) 1 % gel Apply topically 2 (two) times daily. 30 g 0  . lidocaine-prilocaine (EMLA) cream Apply to affected area once 30 g 3  . LORazepam (ATIVAN) 0.5 MG tablet Take 1 tablet (0.5 mg total) by mouth at bedtime as needed for sleep. 30 tablet 0  . ondansetron (ZOFRAN) 8 MG tablet Take 1 tablet (8 mg total) by mouth 2 (two) times daily as needed. Start on the third day after chemotherapy. 30 tablet 1  . prochlorperazine (COMPAZINE) 10 MG tablet Take 1 tablet (10 mg total) by mouth every 6 (six) hours as needed (Nausea or vomiting). 30 tablet 1  . traMADol (ULTRAM) 50 MG tablet Take 1 tablet (50 mg total) by mouth every 6 (six) hours as needed. 60 tablet 0  . venlafaxine XR (EFFEXOR XR) 37.5 MG 24 hr  capsule Take 1 capsule (37.5 mg total) by mouth daily with breakfast. 30 capsule 3   No current facility-administered medications for this visit.    PHYSICAL EXAMINATION: ECOG PERFORMANCE STATUS: 1 - Symptomatic but completely ambulatory  Vitals:   12/20/18 1015  BP: (!) 109/45  Pulse: 69  Resp: 17  Temp: 97.9 F (36.6 C)  SpO2: 99%   Filed Weights   12/20/18 1015  Weight: 273 lb 6.4 oz (124 kg)    GENERAL: alert, no distress and comfortable SKIN: skin color, texture, turgor are normal, no rashes or significant lesions EYES: normal, Conjunctiva are pink and non-injected, sclera clear OROPHARYNX: no exudate, no erythema and lips, buccal mucosa, and tongue normal  NECK: supple, thyroid normal size, non-tender, without nodularity LYMPH: no palpable lymphadenopathy in the cervical, axillary or inguinal LUNGS: clear to auscultation and percussion with normal breathing effort HEART: regular rate & rhythm and no murmurs and no lower extremity edema ABDOMEN: abdomen soft, non-tender and normal bowel sounds MUSCULOSKELETAL: no cyanosis of digits and no clubbing  NEURO: alert & oriented x 3 with fluent speech, mild peripheral neuropathy left foot EXTREMITIES: No lower extremity edema  LABORATORY DATA:  I have reviewed the data as listed CMP Latest Ref Rng & Units 12/13/2018 12/06/2018 11/01/2018  Glucose 70 - 99 mg/dL 78 94 101(H)  BUN 6 - 20 mg/dL '12 11 11  '$ Creatinine 0.44 - 1.00 mg/dL 0.77 0.80 0.75  Sodium 135 - 145 mmol/L 140 140 140  Potassium  3.5 - 5.1 mmol/L 3.8 4.3 4.0  Chloride 98 - 111 mmol/L 106 106 105  CO2 22 - 32 mmol/L '25 22 24  '$ Calcium 8.9 - 10.3 mg/dL 8.7(L) 9.1 9.1  Total Protein 6.5 - 8.1 g/dL 6.6 6.8 6.6  Total Bilirubin 0.3 - 1.2 mg/dL 0.4 0.5 0.4  Alkaline Phos 38 - 126 U/L 120 88 102  AST 15 - 41 U/L 43(H) 21 12(L)  ALT 0 - 44 U/L 53(H) 28 17    Lab Results  Component Value Date   WBC 2.9 (L) 12/20/2018   HGB 10.7 (L) 12/20/2018   HCT 32.8 (L)  12/20/2018   MCV 98.5 12/20/2018   PLT 205 12/20/2018   NEUTROABS 1.6 (L) 12/20/2018    ASSESSMENT & PLAN:  Malignant neoplasm of upper-inner quadrant of left breast in female, estrogen receptor positive ([place]) 07/05/2018:Screening mammogram detected bilateral breast masses. Diagnostic mammogram and US showed cysts in the right breast and a 1.2cm left breast mass at the 9 o'clock position. 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 Stage Ia Brief course of neoadjuvant tamoxifen therapy started 07/07/2018 08/04/2018: Left lumpectomy:Left lumpectomy Caitlin Graham): IDC with intermediate grade DCIS, 1.2cm, grade 1, clear margins, with 3 axillary lymph nodes negative for carcinoma ER 100%, PR 60%, Ki-67 50%  Oncotype DX score: 32, distant recurrence at 9 years with hormone therapy alone: 20%  Treatment plan: 1.Adjuvant chemotherapy with dose dense Adriamycin and Cytoxan x4 followed by Taxol x12 2.Given radiation therapy followed by 3.Adjuvant antiestrogen therapy ----------------------------------------------------------------------------------------------------------------------------- Current treatment:Completed 4 cycles ofdose dense Adriamycin Cytoxan, today cycle 4 Taxol  Chemo toxicities: 1.Chemotherapy-induced anemia:Stable 2.Fatigue 3. Emotional outburst and tearfulness:Currently on Effexor 37.5 mg. We may increase it if necessary. 4.  COVID-19 positivity after first cycle of Taxol.  We held chemotherapy.   5.  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  Leukopenia/neutropenia: ANC today is 1.6.  We will watch for the time being.  I discussed with her that a dose reduction may be necessary in the future.  patient is worried about going to school but she is also missing school and wants to go back. 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  Return to clinic weekly for Taxol and every other week for  follow-up with me    No orders of the defined types were placed in this encounter.  The patient has a good understanding of the overall plan. she agrees with it. she will call with any problems that may develop before the next visit here.  Caitlin Lose, MD 12/20/2018  Julious Oka Dorshimer, am acting as scribe for Dr. Nicholas Graham.  I have reviewed the above document for accuracy and completeness, and I agree with the above.

## 2018-12-20 ENCOUNTER — Inpatient Hospital Stay (HOSPITAL_BASED_OUTPATIENT_CLINIC_OR_DEPARTMENT_OTHER): Payer: BC Managed Care – PPO | Admitting: Hematology and Oncology

## 2018-12-20 ENCOUNTER — Other Ambulatory Visit: Payer: Self-pay

## 2018-12-20 ENCOUNTER — Encounter: Payer: Self-pay | Admitting: *Deleted

## 2018-12-20 ENCOUNTER — Inpatient Hospital Stay: Payer: BC Managed Care – PPO

## 2018-12-20 ENCOUNTER — Other Ambulatory Visit: Payer: Self-pay | Admitting: Hematology and Oncology

## 2018-12-20 DIAGNOSIS — C50212 Malignant neoplasm of upper-inner quadrant of left female breast: Secondary | ICD-10-CM

## 2018-12-20 DIAGNOSIS — Z170 Estrogen receptor positive status [ER+]: Secondary | ICD-10-CM

## 2018-12-20 DIAGNOSIS — Z5111 Encounter for antineoplastic chemotherapy: Secondary | ICD-10-CM | POA: Diagnosis not present

## 2018-12-20 DIAGNOSIS — Z95828 Presence of other vascular implants and grafts: Secondary | ICD-10-CM

## 2018-12-20 LAB — CMP (CANCER CENTER ONLY)
ALT: 24 U/L (ref 0–44)
AST: 16 U/L (ref 15–41)
Albumin: 3.5 g/dL (ref 3.5–5.0)
Alkaline Phosphatase: 91 U/L (ref 38–126)
Anion gap: 8 (ref 5–15)
BUN: 14 mg/dL (ref 6–20)
CO2: 24 mmol/L (ref 22–32)
Calcium: 8.6 mg/dL — ABNORMAL LOW (ref 8.9–10.3)
Chloride: 107 mmol/L (ref 98–111)
Creatinine: 0.7 mg/dL (ref 0.44–1.00)
GFR, Est AFR Am: >60 mL/min (ref >60)
GFR, Estimated: >60 mL/min (ref >60)
Glucose, Bld: 102 mg/dL — ABNORMAL HIGH (ref 70–99)
Potassium: 4.2 mmol/L (ref 3.5–5.1)
Sodium: 139 mmol/L (ref 135–145)
Total Bilirubin: 0.5 mg/dL (ref 0.3–1.2)
Total Protein: 6.6 g/dL (ref 6.5–8.1)

## 2018-12-20 LAB — CBC WITH DIFFERENTIAL (CANCER CENTER ONLY)
Abs Immature Granulocytes: 0.01 10*3/uL (ref 0.00–0.07)
Basophils Absolute: 0 10*3/uL (ref 0.0–0.1)
Basophils Relative: 1 %
Eosinophils Absolute: 0.1 10*3/uL (ref 0.0–0.5)
Eosinophils Relative: 5 %
HCT: 32.8 % — ABNORMAL LOW (ref 36.0–46.0)
Hemoglobin: 10.7 g/dL — ABNORMAL LOW (ref 12.0–15.0)
Immature Granulocytes: 0 %
Lymphocytes Relative: 32 %
Lymphs Abs: 0.9 10*3/uL (ref 0.7–4.0)
MCH: 32.1 pg (ref 26.0–34.0)
MCHC: 32.6 g/dL (ref 30.0–36.0)
MCV: 98.5 fL (ref 80.0–100.0)
Monocytes Absolute: 0.2 10*3/uL (ref 0.1–1.0)
Monocytes Relative: 8 %
Neutro Abs: 1.6 10*3/uL — ABNORMAL LOW (ref 1.7–7.7)
Neutrophils Relative %: 54 %
Platelet Count: 205 10*3/uL (ref 150–400)
RBC: 3.33 MIL/uL — ABNORMAL LOW (ref 3.87–5.11)
RDW: 16.4 % — ABNORMAL HIGH (ref 11.5–15.5)
WBC Count: 2.9 10*3/uL — ABNORMAL LOW (ref 4.0–10.5)
nRBC: 0 % (ref 0.0–0.2)

## 2018-12-20 MED ORDER — SODIUM CHLORIDE 0.9 % IV SOLN
65.0000 mg/m2 | Freq: Once | INTRAVENOUS | Status: AC
  Administered 2018-12-20: 156 mg via INTRAVENOUS
  Filled 2018-12-20: qty 26

## 2018-12-20 MED ORDER — FAMOTIDINE IN NACL 20-0.9 MG/50ML-% IV SOLN
20.0000 mg | Freq: Once | INTRAVENOUS | Status: AC
  Administered 2018-12-20: 20 mg via INTRAVENOUS

## 2018-12-20 MED ORDER — HEPARIN SOD (PORK) LOCK FLUSH 100 UNIT/ML IV SOLN
500.0000 [IU] | Freq: Once | INTRAVENOUS | Status: AC | PRN
  Administered 2018-12-20: 500 [IU]
  Filled 2018-12-20: qty 5

## 2018-12-20 MED ORDER — SODIUM CHLORIDE 0.9% FLUSH
10.0000 mL | INTRAVENOUS | Status: DC | PRN
  Administered 2018-12-20: 10 mL
  Filled 2018-12-20: qty 10

## 2018-12-20 MED ORDER — FAMOTIDINE IN NACL 20-0.9 MG/50ML-% IV SOLN
INTRAVENOUS | Status: AC
  Filled 2018-12-20: qty 50

## 2018-12-20 MED ORDER — DIPHENHYDRAMINE HCL 50 MG/ML IJ SOLN
25.0000 mg | Freq: Once | INTRAMUSCULAR | Status: AC
  Administered 2018-12-20: 25 mg via INTRAVENOUS

## 2018-12-20 MED ORDER — SODIUM CHLORIDE 0.9 % IV SOLN
Freq: Once | INTRAVENOUS | Status: AC
  Administered 2018-12-20: 11:00:00 via INTRAVENOUS
  Filled 2018-12-20: qty 250

## 2018-12-20 MED ORDER — DIPHENHYDRAMINE HCL 50 MG/ML IJ SOLN
INTRAMUSCULAR | Status: AC
  Filled 2018-12-20: qty 1

## 2018-12-20 MED ORDER — SODIUM CHLORIDE 0.9 % IV SOLN
20.0000 mg | Freq: Once | INTRAVENOUS | Status: AC
  Administered 2018-12-20: 20 mg via INTRAVENOUS
  Filled 2018-12-20: qty 20

## 2018-12-20 NOTE — Patient Instructions (Signed)

## 2018-12-20 NOTE — Progress Notes (Signed)
Because of leukopenia, we will reduce the dosage of Taxol to 65 mg/m

## 2018-12-20 NOTE — Assessment & Plan Note (Signed)
07/05/2018:Screening mammogram detected bilateral breast masses. Diagnostic mammogram and US showed cysts in the right breast and a 1.2cm left breast mass at the 9 o'clock position. 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 Stage Ia Brief course of neoadjuvant tamoxifen therapy started 07/07/2018 08/04/2018: Left lumpectomy:Left lumpectomy [name]): IDC with intermediate grade DCIS, 1.2cm, grade 1, clear margins, with 3 axillary lymph nodes negative for carcinoma ER 100%, PR 60%, Ki-67 50%  Oncotype DX score: 32, distant recurrence at 9 years with hormone therapy alone: 20%  Treatment plan: 1.Adjuvant chemotherapy with dose dense Adriamycin and Cytoxan x4 followed by Taxol x12 2.Given radiation therapy followed by 3.Adjuvant antiestrogen therapy ----------------------------------------------------------------------------------------------------------------------------- Current treatment:Completed 4 cycles ofdose dense Adriamycin Cytoxan, today cycle 4 Taxol  Chemo toxicities: 1.Chemotherapy-induced anemia:Stable 2.Fatigue 3. Emotional outburst and tearfulness:Currently on Effexor 37.5 mg. We may increase it if necessary. 4.  COVID-19 positivity after first cycle of Taxol.  We held chemotherapy.    Patient is worried about going to school but she is also missing school and wants to go back. 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  Return to clinic weekly for Taxol and every other week for follow-up with me

## 2018-12-20 NOTE — Progress Notes (Signed)
12/20/18  Dose in at full dose of 80 mg/m2 - provider note indicated a dose reduction.  Contacted MD and wants dose to be adjusted to 65 mg/m2  Orders modified to reflect the above request.  T.O. Dr Barbee Cough RN/Sagal Gayton Ronnald Ramp, PharmD

## 2018-12-27 ENCOUNTER — Inpatient Hospital Stay: Payer: BC Managed Care – PPO

## 2018-12-27 ENCOUNTER — Other Ambulatory Visit: Payer: Self-pay

## 2018-12-27 VITALS — BP 120/55 | HR 81 | Temp 98.2°F | Resp 18

## 2018-12-27 DIAGNOSIS — Z5111 Encounter for antineoplastic chemotherapy: Secondary | ICD-10-CM | POA: Diagnosis not present

## 2018-12-27 DIAGNOSIS — Z95828 Presence of other vascular implants and grafts: Secondary | ICD-10-CM

## 2018-12-27 DIAGNOSIS — Z170 Estrogen receptor positive status [ER+]: Secondary | ICD-10-CM

## 2018-12-27 DIAGNOSIS — C50212 Malignant neoplasm of upper-inner quadrant of left female breast: Secondary | ICD-10-CM

## 2018-12-27 LAB — CMP (CANCER CENTER ONLY)
ALT: 18 U/L (ref 0–44)
AST: 13 U/L — ABNORMAL LOW (ref 15–41)
Albumin: 3.3 g/dL — ABNORMAL LOW (ref 3.5–5.0)
Alkaline Phosphatase: 85 U/L (ref 38–126)
Anion gap: 9 (ref 5–15)
BUN: 10 mg/dL (ref 6–20)
CO2: 24 mmol/L (ref 22–32)
Calcium: 8.5 mg/dL — ABNORMAL LOW (ref 8.9–10.3)
Chloride: 107 mmol/L (ref 98–111)
Creatinine: 0.75 mg/dL (ref 0.44–1.00)
GFR, Est AFR Am: >60 mL/min (ref >60)
GFR, Estimated: >60 mL/min (ref >60)
Glucose, Bld: 80 mg/dL (ref 70–99)
Potassium: 3.8 mmol/L (ref 3.5–5.1)
Sodium: 140 mmol/L (ref 135–145)
Total Bilirubin: 0.4 mg/dL (ref 0.3–1.2)
Total Protein: 6.5 g/dL (ref 6.5–8.1)

## 2018-12-27 LAB — CBC WITH DIFFERENTIAL (CANCER CENTER ONLY)
Abs Immature Granulocytes: 0.05 10*3/uL (ref 0.00–0.07)
Basophils Absolute: 0.1 10*3/uL (ref 0.0–0.1)
Basophils Relative: 2 %
Eosinophils Absolute: 0.1 10*3/uL (ref 0.0–0.5)
Eosinophils Relative: 4 %
HCT: 34.5 % — ABNORMAL LOW (ref 36.0–46.0)
Hemoglobin: 11.1 g/dL — ABNORMAL LOW (ref 12.0–15.0)
Immature Granulocytes: 2 %
Lymphocytes Relative: 31 %
Lymphs Abs: 1 10*3/uL (ref 0.7–4.0)
MCH: 32.4 pg (ref 26.0–34.0)
MCHC: 32.2 g/dL (ref 30.0–36.0)
MCV: 100.6 fL — ABNORMAL HIGH (ref 80.0–100.0)
Monocytes Absolute: 0.4 10*3/uL (ref 0.1–1.0)
Monocytes Relative: 12 %
Neutro Abs: 1.5 10*3/uL — ABNORMAL LOW (ref 1.7–7.7)
Neutrophils Relative %: 49 %
Platelet Count: 222 10*3/uL (ref 150–400)
RBC: 3.43 MIL/uL — ABNORMAL LOW (ref 3.87–5.11)
RDW: 15.9 % — ABNORMAL HIGH (ref 11.5–15.5)
WBC Count: 3.1 10*3/uL — ABNORMAL LOW (ref 4.0–10.5)
nRBC: 0 % (ref 0.0–0.2)

## 2018-12-27 MED ORDER — SODIUM CHLORIDE 0.9% FLUSH
10.0000 mL | INTRAVENOUS | Status: DC | PRN
  Administered 2018-12-27: 10 mL
  Filled 2018-12-27: qty 10

## 2018-12-27 MED ORDER — DIPHENHYDRAMINE HCL 50 MG/ML IJ SOLN
INTRAMUSCULAR | Status: AC
  Filled 2018-12-27: qty 1

## 2018-12-27 MED ORDER — SODIUM CHLORIDE 0.9 % IV SOLN
20.0000 mg | Freq: Once | INTRAVENOUS | Status: AC
  Administered 2018-12-27: 20 mg via INTRAVENOUS
  Filled 2018-12-27: qty 20

## 2018-12-27 MED ORDER — SODIUM CHLORIDE 0.9 % IV SOLN
Freq: Once | INTRAVENOUS | Status: AC
  Filled 2018-12-27: qty 250

## 2018-12-27 MED ORDER — HEPARIN SOD (PORK) LOCK FLUSH 100 UNIT/ML IV SOLN
500.0000 [IU] | Freq: Once | INTRAVENOUS | Status: AC | PRN
  Administered 2018-12-27: 500 [IU]
  Filled 2018-12-27: qty 5

## 2018-12-27 MED ORDER — FAMOTIDINE IN NACL 20-0.9 MG/50ML-% IV SOLN
20.0000 mg | Freq: Once | INTRAVENOUS | Status: AC
  Administered 2018-12-27: 20 mg via INTRAVENOUS

## 2018-12-27 MED ORDER — DIPHENHYDRAMINE HCL 50 MG/ML IJ SOLN
25.0000 mg | Freq: Once | INTRAMUSCULAR | Status: AC
  Administered 2018-12-27: 25 mg via INTRAVENOUS

## 2018-12-27 MED ORDER — SODIUM CHLORIDE 0.9 % IV SOLN
65.0000 mg/m2 | Freq: Once | INTRAVENOUS | Status: AC
  Administered 2018-12-27: 156 mg via INTRAVENOUS
  Filled 2018-12-27: qty 26

## 2018-12-27 MED ORDER — FAMOTIDINE IN NACL 20-0.9 MG/50ML-% IV SOLN
INTRAVENOUS | Status: AC
  Filled 2018-12-27: qty 50

## 2018-12-27 NOTE — Progress Notes (Signed)
I called Dr. Lindi Adie re: [place] = 1.5 today. He would like to continue Taxol at 65 mg/m2.  He will continue to monitor.  Kennith Center, Pharm.D., CPP 12/27/2018@9 :51 AM

## 2018-12-27 NOTE — Patient Instructions (Signed)
[facility] Discharge Instructions for Patients Receiving Chemotherapy  Today you received the following chemotherapy agents:  Taxol.  To help prevent nausea and vomiting after your treatment, we encourage you to take your nausea medication as directed.   If you develop nausea and vomiting that is not controlled by your nausea medication, call the clinic.   BELOW ARE SYMPTOMS THAT SHOULD BE REPORTED IMMEDIATELY:  *FEVER GREATER THAN 100.5 F  *CHILLS WITH OR WITHOUT FEVER  NAUSEA AND VOMITING THAT IS NOT CONTROLLED WITH YOUR NAUSEA MEDICATION  *UNUSUAL SHORTNESS OF BREATH  *UNUSUAL BRUISING OR BLEEDING  TENDERNESS IN MOUTH AND THROAT WITH OR WITHOUT PRESENCE OF ULCERS  *URINARY PROBLEMS  *BOWEL PROBLEMS  UNUSUAL RASH Items with * indicate a potential emergency and should be followed up as soon as possible.  Feel free to call the clinic should you have any questions or concerns. The clinic phone number is [phone].  Please show the CHEMO ALERT CARD at check-in to the Emergency Department and triage nurse.   

## 2019-01-02 NOTE — Progress Notes (Signed)
Patient Care Team: Patient, [name] as PCP - General (General Practice) Caitlin Kaufmann, RN as Oncology Nurse Navigator Caitlin Germany, RN as Oncology Nurse Navigator Caitlin Lose, MD as Consulting Physician (Hematology and Oncology) Caitlin Kussmaul, MD as Consulting Physician (General Surgery) Caitlin Gibson, MD as Attending Physician (Radiation Oncology)  DIAGNOSIS:    ICD-10-CM   1. Malignant neoplasm of upper-inner quadrant of left breast in female, estrogen receptor positive ([place])  C50.212 LORazepam (ATIVAN) 0.5 MG tablet   Z17.0     SUMMARY OF ONCOLOGIC HISTORY: Oncology History  Malignant neoplasm of upper-inner quadrant of left breast in female, estrogen receptor positive ([place])  07/05/2018 Initial Diagnosis   Screening mammogram detected bilateral breast masses. Diagnostic mammogram and US showed cysts in the right breast and a 1.2cm left breast mass at the 9 o'clock position. 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    07/07/2018 Cancer Staging   Staging form: Breast, AJCC 8th Edition - Clinical stage from 07/07/2018: Stage IA (cT1c, cN0, cM0, G2, ER+, PR+, HER2-) - Signed by Caitlin Lose, MD on 07/07/2018    Genetic Testing   Negative testing. No pathogenic variants identified on the [facility] Common Hereditary Cancers Panel. The Common Hereditary Cancers Panel offered by [facility] includes sequencing and/or deletion duplication testing of the following 47 genes: APC, ATM, AXIN2, BARD1, BMPR1A, BRCA1, BRCA2, BRIP1, CDH1, CDKN2A (p14ARF), CDKN2A (p16INK4a), CKD4, CHEK2, CTNNA1, DICER1, EPCAM (Deletion/duplication testing only), GREM1 (promoter region deletion/duplication testing only), KIT, MEN1, MLH1, MSH2, MSH3, MSH6, MUTYH, NBN, NF1, NHTL1, PALB2, PDGFRA, PMS2, POLD1, POLE, PTEN, RAD50, RAD51C, RAD51D, SDHB, SDHC, SDHD, SMAD4, SMARCA4. STK11, TP53, TSC1, TSC2, and VHL.  The following genes were evaluated for sequence changes only: SDHA and  HOXB13 c.251G>A variant only. The report date is 07/18/2018.   08/04/2018 Surgery   Left lumpectomy Caitlin Graham): IDC with intermediate grade DCIS, 1.2cm, grade 1, clear margins, with 3 axillary lymph nodes negative for carcinoma ER 100%, PR 60%, Ki-67 50%   09/07/2018 -  Chemotherapy   The patient had DOXOrubicin (ADRIAMYCIN) chemo injection 146 mg, 60 mg/m2 = 146 mg, Intravenous,  Once, 4 of 4 cycles Administration: 146 mg (09/07/2018), 146 mg (09/20/2018), 146 mg (10/04/2018), 146 mg (10/18/2018) palonosetron (ALOXI) injection 0.25 mg, 0.25 mg, Intravenous,  Once, 4 of 4 cycles Administration: 0.25 mg (09/07/2018), 0.25 mg (09/20/2018), 0.25 mg (10/04/2018), 0.25 mg (10/18/2018) pegfilgrastim-jmdb (FULPHILA) injection 6 mg, 6 mg, Subcutaneous,  Once, 4 of 4 cycles Administration: 6 mg (09/09/2018), 6 mg (09/22/2018), 6 mg (10/06/2018), 6 mg (10/20/2018) cyclophosphamide (CYTOXAN) 1,460 mg in sodium chloride 0.9 % 250 mL chemo infusion, 600 mg/m2 = 1,460 mg, Intravenous,  Once, 4 of 4 cycles Administration: 1,460 mg (09/07/2018), 1,460 mg (09/20/2018), 1,460 mg (10/04/2018), 1,460 mg (10/18/2018) PACLitaxel (TAXOL) 198 mg in sodium chloride 0.9 % 250 mL chemo infusion (</= '80mg'$ /m2), 80 mg/m2 = 198 mg, Intravenous,  Once, 5 of 12 cycles Dose modification: 65 mg/m2 (original dose 80 mg/m2, Cycle 9, Reason: Provider Judgment, Comment: leukopenia) Administration: 198 mg (11/01/2018), 198 mg (12/06/2018), 198 mg (12/13/2018), 156 mg (12/20/2018), 156 mg (12/27/2018) fosaprepitant (EMEND) 150 mg, dexamethasone (DECADRON) 12 mg in sodium chloride 0.9 % 145 mL IVPB, , Intravenous,  Once, 4 of 4 cycles Administration:  (09/07/2018),  (09/20/2018),  (10/04/2018),  (10/18/2018)  for chemotherapy treatment.      CHIEF COMPLIANT: Cycle 6 Taxol  INTERVAL HISTORY: Caitlin Graham is a 53 y.o. with above-mentioned history of  left breast cancer who underwent a lumpectomyand is currently onadjuvant chemotherapy treatment. She completed 4  cycles ofdense Adriamycin and Cytoxanand is currently receiving weekly Taxol.She presents to the clinic todayforcycle6.  She continues to have the mild numbness in the left toes.  Denies any nausea vomiting.  She has been gaining a lot of weight because she is not watching her diet.  She has had acne on her face and clindamycin cream has been helping her tremendously.  REVIEW OF SYSTEMS:   Constitutional: Denies fevers, chills or abnormal weight loss Eyes: Denies blurriness of vision Ears, nose, mouth, throat, and face: Denies mucositis or sore throat Respiratory: Denies cough, dyspnea or wheezes Cardiovascular: Denies palpitation, chest discomfort Gastrointestinal: Denies nausea, heartburn or change in bowel habits Skin: Denies abnormal skin rashes Lymphatics: Denies new lymphadenopathy or easy bruising Neurological: Denies numbness, tingling or new weaknesses Behavioral/Psych: Mood is stable, no new changes  Extremities: No lower extremity edema Breast: denies any pain or lumps or nodules in either breasts All other systems were reviewed with the patient and are negative.  I have reviewed the past medical history, past surgical history, social history and family history with the patient and they are unchanged from previous note.  ALLERGIES:  is allergic to aspirin; ibuprofen; hydrocodone; oxycodone; and phenergan [promethazine].  MEDICATIONS:  Current Outpatient Medications  Medication Sig Dispense Refill  . acetaminophen (TYLENOL) 325 MG tablet Take 650 mg by mouth every 6 (six) hours as needed.    . clindamycin (CLINDAGEL) 1 % gel Apply topically 2 (two) times daily. 30 g 1  . lidocaine-prilocaine (EMLA) cream Apply to affected area once 30 g 3  . LORazepam (ATIVAN) 0.5 MG tablet Take 1 tablet (0.5 mg total) by mouth at bedtime as needed for sleep. 30 tablet 2  . ondansetron (ZOFRAN) 8 MG tablet Take 1 tablet (8 mg total) by mouth 2 (two) times daily as needed. Start on the  third day after chemotherapy. 30 tablet 1  . prochlorperazine (COMPAZINE) 10 MG tablet Take 1 tablet (10 mg total) by mouth every 6 (six) hours as needed (Nausea or vomiting). 30 tablet 1  . traMADol (ULTRAM) 50 MG tablet Take 1 tablet (50 mg total) by mouth every 6 (six) hours as needed. 60 tablet 0  . venlafaxine XR (EFFEXOR XR) 37.5 MG 24 hr capsule Take 1 capsule (37.5 mg total) by mouth daily with breakfast. 30 capsule 3   No current facility-administered medications for this visit.    PHYSICAL EXAMINATION: ECOG PERFORMANCE STATUS: 1 - Symptomatic but completely ambulatory  Vitals:   01/03/19 0950  BP: 131/69  Pulse: 83  Resp: 18  Temp: 98.2 F (36.8 C)  SpO2: 100%   Filed Weights   01/03/19 0950  Weight: 276 lb 8 oz (125.4 kg)    GENERAL: alert, no distress and comfortable SKIN: skin color, texture, turgor are normal, no rashes or significant lesions EYES: normal, Conjunctiva are pink and non-injected, sclera clear OROPHARYNX: no exudate, no erythema and lips, buccal mucosa, and tongue normal  NECK: supple, thyroid normal size, non-tender, without nodularity LYMPH: no palpable lymphadenopathy in the cervical, axillary or inguinal LUNGS: clear to auscultation and percussion with normal breathing effort HEART: regular rate & rhythm and no murmurs and no lower extremity edema ABDOMEN: abdomen soft, non-tender and normal bowel sounds MUSCULOSKELETAL: no cyanosis of digits and no clubbing  NEURO: alert & oriented x 3 with fluent speech, no focal motor/sensory deficits EXTREMITIES: No lower extremity edema  LABORATORY DATA:  I have reviewed the data as listed CMP Latest Ref Rng & Units 12/27/2018 12/20/2018 12/13/2018  Glucose 70 - 99 mg/dL 80 102(H) 78  BUN 6 - 20 mg/dL '10 14 12  '$ Creatinine 0.44 - 1.00 mg/dL 0.75 0.70 0.77  Sodium 135 - 145 mmol/L 140 139 140  Potassium 3.5 - 5.1 mmol/L 3.8 4.2 3.8  Chloride 98 - 111 mmol/L 107 107 106  CO2 22 - 32 mmol/L '24 24 25    '$ Calcium 8.9 - 10.3 mg/dL 8.5(L) 8.6(L) 8.7(L)  Total Protein 6.5 - 8.1 g/dL 6.5 6.6 6.6  Total Bilirubin 0.3 - 1.2 mg/dL 0.4 0.5 0.4  Alkaline Phos 38 - 126 U/L 85 91 120  AST 15 - 41 U/L 13(L) 16 43(H)  ALT 0 - 44 U/L 18 24 53(H)    Lab Results  Component Value Date   WBC 4.1 01/03/2019   HGB 11.1 (L) 01/03/2019   HCT 34.3 (L) 01/03/2019   MCV 99.7 01/03/2019   PLT 238 01/03/2019   NEUTROABS 2.5 01/03/2019    ASSESSMENT & PLAN:  Malignant neoplasm of upper-inner quadrant of left breast in female, estrogen receptor positive ([place]) 07/05/2018:Screening mammogram detected bilateral breast masses. Diagnostic mammogram and US showed cysts in the right breast and a 1.2cm left breast mass at the 9 o'clock position. 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 Stage Ia Brief course of neoadjuvant tamoxifen therapy started 07/07/2018 08/04/2018: Left lumpectomy:Left lumpectomy Caitlin Graham): IDC with intermediate grade DCIS, 1.2cm, grade 1, clear margins, with 3 axillary lymph nodes negative for carcinoma ER 100%, PR 60%, Ki-67 50%  Oncotype DX score: 32, distant recurrence at 9 years with hormone therapy alone: 20%  Treatment plan: 1.Adjuvant chemotherapy with dose dense Adriamycin and Cytoxan x4 followed by Taxol x12 2.Given radiation therapy followed by 3.Adjuvant antiestrogen therapy ----------------------------------------------------------------------------------------------------------------------------- Current treatment:Completed 4 cycles ofdose dense Adriamycin Cytoxan, today cycle6Taxol  Chemo toxicities: 1.Chemotherapy-induced anemia:Stable 2.Fatigue 3.Emotional outburst and tearfulness:Currently on Effexor 37.5 mg. We may increase it if necessary. 4.COVID-19 positivity after first cycle of Taxol. We held chemotherapy.   5.  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  Leukopenia/neutropenia: Taxol dose has  been reduced: The white count is stable.    Return to clinic weekly for Taxol and every other week for follow-up with me    No orders of the defined types were placed in this encounter.  The patient has a good understanding of the overall plan. she agrees with it. she will call with any problems that may develop before the next visit here.  Caitlin Lose, MD 01/03/2019  Julious Oka Dorshimer, am acting as scribe for Dr. Nicholas Graham.  I have reviewed the above document for accuracy and completeness, and I agree with the above.

## 2019-01-03 ENCOUNTER — Other Ambulatory Visit: Payer: Self-pay | Admitting: Hematology and Oncology

## 2019-01-03 ENCOUNTER — Other Ambulatory Visit: Payer: Self-pay

## 2019-01-03 ENCOUNTER — Inpatient Hospital Stay: Payer: BC Managed Care – PPO

## 2019-01-03 ENCOUNTER — Ambulatory Visit: Payer: BC Managed Care – PPO

## 2019-01-03 ENCOUNTER — Inpatient Hospital Stay (HOSPITAL_BASED_OUTPATIENT_CLINIC_OR_DEPARTMENT_OTHER): Payer: BC Managed Care – PPO | Admitting: Hematology and Oncology

## 2019-01-03 DIAGNOSIS — Z170 Estrogen receptor positive status [ER+]: Secondary | ICD-10-CM

## 2019-01-03 DIAGNOSIS — Z5111 Encounter for antineoplastic chemotherapy: Secondary | ICD-10-CM | POA: Diagnosis not present

## 2019-01-03 DIAGNOSIS — C50212 Malignant neoplasm of upper-inner quadrant of left female breast: Secondary | ICD-10-CM

## 2019-01-03 DIAGNOSIS — Z95828 Presence of other vascular implants and grafts: Secondary | ICD-10-CM

## 2019-01-03 LAB — CBC WITH DIFFERENTIAL (CANCER CENTER ONLY)
Abs Immature Granulocytes: 0.07 10*3/uL (ref 0.00–0.07)
Basophils Absolute: 0 10*3/uL (ref 0.0–0.1)
Basophils Relative: 1 %
Eosinophils Absolute: 0.1 10*3/uL (ref 0.0–0.5)
Eosinophils Relative: 2 %
HCT: 34.3 % — ABNORMAL LOW (ref 36.0–46.0)
Hemoglobin: 11.1 g/dL — ABNORMAL LOW (ref 12.0–15.0)
Immature Granulocytes: 2 %
Lymphocytes Relative: 25 %
Lymphs Abs: 1 10*3/uL (ref 0.7–4.0)
MCH: 32.3 pg (ref 26.0–34.0)
MCHC: 32.4 g/dL (ref 30.0–36.0)
MCV: 99.7 fL (ref 80.0–100.0)
Monocytes Absolute: 0.4 10*3/uL (ref 0.1–1.0)
Monocytes Relative: 10 %
Neutro Abs: 2.5 10*3/uL (ref 1.7–7.7)
Neutrophils Relative %: 60 %
Platelet Count: 238 10*3/uL (ref 150–400)
RBC: 3.44 MIL/uL — ABNORMAL LOW (ref 3.87–5.11)
RDW: 15.5 % (ref 11.5–15.5)
WBC Count: 4.1 10*3/uL (ref 4.0–10.5)
nRBC: 0 % (ref 0.0–0.2)

## 2019-01-03 LAB — CMP (CANCER CENTER ONLY)
ALT: 22 U/L (ref 0–44)
AST: 15 U/L (ref 15–41)
Albumin: 3.5 g/dL (ref 3.5–5.0)
Alkaline Phosphatase: 90 U/L (ref 38–126)
Anion gap: 8 (ref 5–15)
BUN: 12 mg/dL (ref 6–20)
CO2: 26 mmol/L (ref 22–32)
Calcium: 8.9 mg/dL (ref 8.9–10.3)
Chloride: 106 mmol/L (ref 98–111)
Creatinine: 0.72 mg/dL (ref 0.44–1.00)
GFR, Est AFR Am: >60 mL/min (ref >60)
GFR, Estimated: >60 mL/min (ref >60)
Glucose, Bld: 91 mg/dL (ref 70–99)
Potassium: 4.3 mmol/L (ref 3.5–5.1)
Sodium: 140 mmol/L (ref 135–145)
Total Bilirubin: 0.4 mg/dL (ref 0.3–1.2)
Total Protein: 6.7 g/dL (ref 6.5–8.1)

## 2019-01-03 MED ORDER — SODIUM CHLORIDE 0.9 % IV SOLN
Freq: Once | INTRAVENOUS | Status: AC
  Filled 2019-01-03: qty 250

## 2019-01-03 MED ORDER — SODIUM CHLORIDE 0.9% FLUSH
10.0000 mL | INTRAVENOUS | Status: DC | PRN
  Administered 2019-01-03: 13:00:00 10 mL
  Filled 2019-01-03: qty 10

## 2019-01-03 MED ORDER — FAMOTIDINE IN NACL 20-0.9 MG/50ML-% IV SOLN
20.0000 mg | Freq: Once | INTRAVENOUS | Status: AC
  Administered 2019-01-03: 11:00:00 20 mg via INTRAVENOUS

## 2019-01-03 MED ORDER — SODIUM CHLORIDE 0.9 % IV SOLN
65.0000 mg/m2 | Freq: Once | INTRAVENOUS | Status: AC
  Administered 2019-01-03: 156 mg via INTRAVENOUS
  Filled 2019-01-03: qty 26

## 2019-01-03 MED ORDER — CLINDAMYCIN PHOSPHATE 1 % EX GEL: Freq: Two times a day (BID) | CUTANEOUS | 1 refills | Status: DC

## 2019-01-03 MED ORDER — SODIUM CHLORIDE 0.9% FLUSH
10.0000 mL | INTRAVENOUS | Status: DC | PRN
  Administered 2019-01-03: 10 mL
  Filled 2019-01-03: qty 10

## 2019-01-03 MED ORDER — FAMOTIDINE IN NACL 20-0.9 MG/50ML-% IV SOLN
INTRAVENOUS | Status: AC
  Filled 2019-01-03: qty 50

## 2019-01-03 MED ORDER — SODIUM CHLORIDE 0.9 % IV SOLN
20.0000 mg | Freq: Once | INTRAVENOUS | Status: AC
  Administered 2019-01-03: 20 mg via INTRAVENOUS
  Filled 2019-01-03: qty 20

## 2019-01-03 MED ORDER — HEPARIN SOD (PORK) LOCK FLUSH 100 UNIT/ML IV SOLN
500.0000 [IU] | Freq: Once | INTRAVENOUS | Status: AC | PRN
  Administered 2019-01-03: 13:00:00 500 [IU]
  Filled 2019-01-03: qty 5

## 2019-01-03 MED ORDER — LORAZEPAM 0.5 MG PO TABS: 0.5000 mg | ORAL_TABLET | Freq: Every evening | ORAL | 2 refills | Status: DC | PRN

## 2019-01-03 MED ORDER — HEPARIN SOD (PORK) LOCK FLUSH 100 UNIT/ML IV SOLN
500.0000 [IU] | Freq: Once | INTRAVENOUS | Status: DC | PRN
  Filled 2019-01-03: qty 5

## 2019-01-03 MED ORDER — DIPHENHYDRAMINE HCL 50 MG/ML IJ SOLN
25.0000 mg | Freq: Once | INTRAMUSCULAR | Status: AC
  Administered 2019-01-03: 11:00:00 25 mg via INTRAVENOUS

## 2019-01-03 MED ORDER — DIPHENHYDRAMINE HCL 50 MG/ML IJ SOLN
INTRAMUSCULAR | Status: AC
  Filled 2019-01-03: qty 1

## 2019-01-03 NOTE — Patient Instructions (Signed)
[facility] Discharge Instructions for Patients Receiving Chemotherapy  Today you received the following chemotherapy agents:  Taxol.  To help prevent nausea and vomiting after your treatment, we encourage you to take your nausea medication as directed.   If you develop nausea and vomiting that is not controlled by your nausea medication, call the clinic.   BELOW ARE SYMPTOMS THAT SHOULD BE REPORTED IMMEDIATELY:  *FEVER GREATER THAN 100.5 F  *CHILLS WITH OR WITHOUT FEVER  NAUSEA AND VOMITING THAT IS NOT CONTROLLED WITH YOUR NAUSEA MEDICATION  *UNUSUAL SHORTNESS OF BREATH  *UNUSUAL BRUISING OR BLEEDING  TENDERNESS IN MOUTH AND THROAT WITH OR WITHOUT PRESENCE OF ULCERS  *URINARY PROBLEMS  *BOWEL PROBLEMS  UNUSUAL RASH Items with * indicate a potential emergency and should be followed up as soon as possible.  Feel free to call the clinic should you have any questions or concerns. The clinic phone number is [phone].  Please show the CHEMO ALERT CARD at check-in to the Emergency Department and triage nurse.   

## 2019-01-03 NOTE — Assessment & Plan Note (Addendum)
07/05/2018:Screening mammogram detected bilateral breast masses. Diagnostic mammogram and US showed cysts in the right breast and a 1.2cm left breast mass at the 9 o'clock position. 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 Stage Ia Brief course of neoadjuvant tamoxifen therapy started 07/07/2018 08/04/2018: Left lumpectomy:Left lumpectomy Caitlin Graham): IDC with intermediate grade DCIS, 1.2cm, grade 1, clear margins, with 3 axillary lymph nodes negative for carcinoma ER 100%, PR 60%, Ki-67 50%  Oncotype DX score: 32, distant recurrence at 9 years with hormone therapy alone: 20%  Treatment plan: 1.Adjuvant chemotherapy with dose dense Adriamycin and Cytoxan x4 followed by Taxol x12 2.Given radiation therapy followed by 3.Adjuvant antiestrogen therapy ----------------------------------------------------------------------------------------------------------------------------- Current treatment:Completed 4 cycles ofdose dense Adriamycin Cytoxan, today cycle6Taxol  Chemo toxicities: 1.Chemotherapy-induced anemia:Stable 2.Fatigue 3.Emotional outburst and tearfulness:Currently on Effexor 37.5 mg. We may increase it if necessary. 4.COVID-19 positivity after first cycle of Taxol. We held chemotherapy.   5.  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  Leukopenia/neutropenia: Taxol dose has been reduced  patient is worried about going to school but she is also missing school and wants to go back. 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  Return to clinic weekly for Taxol and every other week for follow-up with me

## 2019-01-06 ENCOUNTER — Encounter: Payer: Self-pay | Admitting: *Deleted

## 2019-01-10 ENCOUNTER — Inpatient Hospital Stay: Payer: BC Managed Care – PPO

## 2019-01-10 ENCOUNTER — Inpatient Hospital Stay: Payer: BC Managed Care – PPO | Attending: Hematology and Oncology

## 2019-01-10 ENCOUNTER — Other Ambulatory Visit: Payer: Self-pay

## 2019-01-10 VITALS — BP 102/60 | HR 76 | Temp 98.2°F | Resp 18 | Wt 277.5 lb

## 2019-01-10 DIAGNOSIS — G620 Drug-induced polyneuropathy: Secondary | ICD-10-CM | POA: Diagnosis not present

## 2019-01-10 DIAGNOSIS — C50212 Malignant neoplasm of upper-inner quadrant of left female breast: Secondary | ICD-10-CM | POA: Diagnosis present

## 2019-01-10 DIAGNOSIS — Z170 Estrogen receptor positive status [ER+]: Secondary | ICD-10-CM | POA: Insufficient documentation

## 2019-01-10 DIAGNOSIS — Z95828 Presence of other vascular implants and grafts: Secondary | ICD-10-CM

## 2019-01-10 DIAGNOSIS — D701 Agranulocytosis secondary to cancer chemotherapy: Secondary | ICD-10-CM | POA: Insufficient documentation

## 2019-01-10 DIAGNOSIS — D6481 Anemia due to antineoplastic chemotherapy: Secondary | ICD-10-CM | POA: Insufficient documentation

## 2019-01-10 DIAGNOSIS — R5383 Other fatigue: Secondary | ICD-10-CM | POA: Diagnosis not present

## 2019-01-10 DIAGNOSIS — Z5111 Encounter for antineoplastic chemotherapy: Secondary | ICD-10-CM | POA: Diagnosis not present

## 2019-01-10 LAB — CMP (CANCER CENTER ONLY)
ALT: 17 U/L (ref 0–44)
AST: 12 U/L — ABNORMAL LOW (ref 15–41)
Albumin: 3.3 g/dL — ABNORMAL LOW (ref 3.5–5.0)
Alkaline Phosphatase: 89 U/L (ref 38–126)
Anion gap: 10 (ref 5–15)
BUN: 11 mg/dL (ref 6–20)
CO2: 24 mmol/L (ref 22–32)
Calcium: 8.7 mg/dL — ABNORMAL LOW (ref 8.9–10.3)
Chloride: 108 mmol/L (ref 98–111)
Creatinine: 0.71 mg/dL (ref 0.44–1.00)
GFR, Est AFR Am: >60 mL/min (ref >60)
GFR, Estimated: >60 mL/min (ref >60)
Glucose, Bld: 104 mg/dL — ABNORMAL HIGH (ref 70–99)
Potassium: 4.1 mmol/L (ref 3.5–5.1)
Sodium: 142 mmol/L (ref 135–145)
Total Bilirubin: 0.5 mg/dL (ref 0.3–1.2)
Total Protein: 6.2 g/dL — ABNORMAL LOW (ref 6.5–8.1)

## 2019-01-10 LAB — CBC WITH DIFFERENTIAL (CANCER CENTER ONLY)
Abs Immature Granulocytes: 0.1 10*3/uL — ABNORMAL HIGH (ref 0.00–0.07)
Basophils Absolute: 0.1 10*3/uL (ref 0.0–0.1)
Basophils Relative: 2 %
Eosinophils Absolute: 0.1 10*3/uL (ref 0.0–0.5)
Eosinophils Relative: 2 %
HCT: 31.7 % — ABNORMAL LOW (ref 36.0–46.0)
Hemoglobin: 10.5 g/dL — ABNORMAL LOW (ref 12.0–15.0)
Immature Granulocytes: 3 %
Lymphocytes Relative: 22 %
Lymphs Abs: 0.9 10*3/uL (ref 0.7–4.0)
MCH: 32.8 pg (ref 26.0–34.0)
MCHC: 33.1 g/dL (ref 30.0–36.0)
MCV: 99.1 fL (ref 80.0–100.0)
Monocytes Absolute: 0.4 10*3/uL (ref 0.1–1.0)
Monocytes Relative: 10 %
Neutro Abs: 2.5 10*3/uL (ref 1.7–7.7)
Neutrophils Relative %: 61 %
Platelet Count: 230 10*3/uL (ref 150–400)
RBC: 3.2 MIL/uL — ABNORMAL LOW (ref 3.87–5.11)
RDW: 15.2 % (ref 11.5–15.5)
WBC Count: 4 10*3/uL (ref 4.0–10.5)
nRBC: 0 % (ref 0.0–0.2)

## 2019-01-10 MED ORDER — SODIUM CHLORIDE 0.9 % IV SOLN
Freq: Once | INTRAVENOUS | Status: AC
  Filled 2019-01-10: qty 250

## 2019-01-10 MED ORDER — SODIUM CHLORIDE 0.9% FLUSH
10.0000 mL | INTRAVENOUS | Status: DC | PRN
  Administered 2019-01-10: 10 mL
  Filled 2019-01-10: qty 10

## 2019-01-10 MED ORDER — DIPHENHYDRAMINE HCL 50 MG/ML IJ SOLN
INTRAMUSCULAR | Status: AC
  Filled 2019-01-10: qty 1

## 2019-01-10 MED ORDER — FAMOTIDINE IN NACL 20-0.9 MG/50ML-% IV SOLN
20.0000 mg | Freq: Once | INTRAVENOUS | Status: AC
  Administered 2019-01-10: 20 mg via INTRAVENOUS

## 2019-01-10 MED ORDER — SODIUM CHLORIDE 0.9 % IV SOLN
20.0000 mg | Freq: Once | INTRAVENOUS | Status: AC
  Administered 2019-01-10: 20 mg via INTRAVENOUS
  Filled 2019-01-10: qty 20

## 2019-01-10 MED ORDER — FAMOTIDINE IN NACL 20-0.9 MG/50ML-% IV SOLN
INTRAVENOUS | Status: AC
  Filled 2019-01-10: qty 50

## 2019-01-10 MED ORDER — DIPHENHYDRAMINE HCL 50 MG/ML IJ SOLN
25.0000 mg | Freq: Once | INTRAMUSCULAR | Status: AC
  Administered 2019-01-10: 25 mg via INTRAVENOUS

## 2019-01-10 MED ORDER — HEPARIN SOD (PORK) LOCK FLUSH 100 UNIT/ML IV SOLN
500.0000 [IU] | Freq: Once | INTRAVENOUS | Status: AC | PRN
  Administered 2019-01-10: 500 [IU]
  Filled 2019-01-10: qty 5

## 2019-01-10 MED ORDER — SODIUM CHLORIDE 0.9 % IV SOLN
65.0000 mg/m2 | Freq: Once | INTRAVENOUS | Status: AC
  Administered 2019-01-10: 156 mg via INTRAVENOUS
  Filled 2019-01-10: qty 26

## 2019-01-10 NOTE — Patient Instructions (Signed)
[facility] Discharge Instructions for Patients Receiving Chemotherapy  Today you received the following chemotherapy agents:  Taxol.  To help prevent nausea and vomiting after your treatment, we encourage you to take your nausea medication as directed.   If you develop nausea and vomiting that is not controlled by your nausea medication, call the clinic.   BELOW ARE SYMPTOMS THAT SHOULD BE REPORTED IMMEDIATELY:  *FEVER GREATER THAN 100.5 F  *CHILLS WITH OR WITHOUT FEVER  NAUSEA AND VOMITING THAT IS NOT CONTROLLED WITH YOUR NAUSEA MEDICATION  *UNUSUAL SHORTNESS OF BREATH  *UNUSUAL BRUISING OR BLEEDING  TENDERNESS IN MOUTH AND THROAT WITH OR WITHOUT PRESENCE OF ULCERS  *URINARY PROBLEMS  *BOWEL PROBLEMS  UNUSUAL RASH Items with * indicate a potential emergency and should be followed up as soon as possible.  Feel free to call the clinic should you have any questions or concerns. The clinic phone number is [phone].  Please show the CHEMO ALERT CARD at check-in to the Emergency Department and triage nurse.   

## 2019-01-10 NOTE — Patient Instructions (Signed)

## 2019-01-16 NOTE — Progress Notes (Signed)
Patient Care Team: Patient, [name] as PCP - General (General Practice) Caitlin Kaufmann, RN as Oncology Nurse Navigator Caitlin Germany, RN as Oncology Nurse Navigator Caitlin Lose, MD as Consulting Physician (Hematology and Oncology) Caitlin Kussmaul, MD as Consulting Physician (General Surgery) Caitlin Gibson, MD as Attending Physician (Radiation Oncology)  DIAGNOSIS:    ICD-10-CM   1. Malignant neoplasm of upper-inner quadrant of left breast in female, estrogen receptor positive (Caitlin Graham)  C50.212    Z17.0     SUMMARY OF ONCOLOGIC HISTORY: Oncology History  Malignant neoplasm of upper-inner quadrant of left breast in female, estrogen receptor positive (Caitlin Graham)  07/05/2018 Initial Diagnosis   Screening mammogram detected bilateral breast masses. Diagnostic mammogram and US showed cysts in the right breast and a 1.2cm left breast mass at the 9 o'clock position. 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    07/07/2018 Cancer Staging   Staging form: Breast, AJCC 8th Edition - Clinical stage from 07/07/2018: Stage IA (cT1c, cN0, cM0, G2, ER+, PR+, HER2-) - Signed by Caitlin Lose, MD on 07/07/2018    Genetic Testing   Negative testing. No pathogenic variants identified on the [facility] Common Hereditary Cancers Panel. The Common Hereditary Cancers Panel offered by [facility] includes sequencing and/or deletion duplication testing of the following 47 genes: APC, ATM, AXIN2, BARD1, BMPR1A, BRCA1, BRCA2, BRIP1, CDH1, CDKN2A (p14ARF), CDKN2A (p16INK4a), CKD4, CHEK2, CTNNA1, DICER1, EPCAM (Deletion/duplication testing only), GREM1 (promoter region deletion/duplication testing only), KIT, MEN1, MLH1, MSH2, MSH3, MSH6, MUTYH, NBN, NF1, NHTL1, PALB2, PDGFRA, PMS2, POLD1, POLE, PTEN, RAD50, RAD51C, RAD51D, SDHB, SDHC, SDHD, SMAD4, SMARCA4. STK11, TP53, TSC1, TSC2, and VHL.  The following genes were evaluated for sequence changes only: SDHA and HOXB13 c.251G>A variant only. The  report date is 07/18/2018.   08/04/2018 Surgery   Left lumpectomy Caitlin Graham): IDC with intermediate grade DCIS, 1.2cm, grade 1, clear margins, with 3 axillary lymph nodes negative for carcinoma ER 100%, PR 60%, Ki-67 50%   09/07/2018 -  Chemotherapy   The patient had DOXOrubicin (ADRIAMYCIN) chemo injection 146 mg, 60 mg/m2 = 146 mg, Intravenous,  Once, 4 of 4 cycles Administration: 146 mg (09/07/2018), 146 mg (09/20/2018), 146 mg (10/04/2018), 146 mg (10/18/2018) palonosetron (ALOXI) injection 0.25 mg, 0.25 mg, Intravenous,  Once, 4 of 4 cycles Administration: 0.25 mg (09/07/2018), 0.25 mg (09/20/2018), 0.25 mg (10/04/2018), 0.25 mg (10/18/2018) pegfilgrastim-jmdb (FULPHILA) injection 6 mg, 6 mg, Subcutaneous,  Once, 4 of 4 cycles Administration: 6 mg (09/09/2018), 6 mg (09/22/2018), 6 mg (10/06/2018), 6 mg (10/20/2018) cyclophosphamide (CYTOXAN) 1,460 mg in sodium chloride 0.9 % 250 mL chemo infusion, 600 mg/m2 = 1,460 mg, Intravenous,  Once, 4 of 4 cycles Administration: 1,460 mg (09/07/2018), 1,460 mg (09/20/2018), 1,460 mg (10/04/2018), 1,460 mg (10/18/2018) PACLitaxel (TAXOL) 198 mg in sodium chloride 0.9 % 250 mL chemo infusion (</= 3m/m2), 80 mg/m2 = 198 mg, Intravenous,  Once, 7 of 12 cycles Dose modification: 65 mg/m2 (original dose 80 mg/m2, Cycle 9, Reason: Provider Judgment, Comment: leukopenia) Administration: 198 mg (11/01/2018), 198 mg (12/06/2018), 198 mg (12/13/2018), 156 mg (12/20/2018), 156 mg (12/27/2018), 156 mg (01/03/2019), 156 mg (01/10/2019) fosaprepitant (EMEND) 150 mg, dexamethasone (DECADRON) 12 mg in sodium chloride 0.9 % 145 mL IVPB, , Intravenous,  Once, 4 of 4 cycles Administration:  (09/07/2018),  (09/20/2018),  (10/04/2018),  (10/18/2018)  for chemotherapy treatment.      CHIEF COMPLIANT: Cycle8Taxol  INTERVAL HISTORY: Caitlin Graham a 54y.o. with above-mentioned history of  left breast cancer who underwent a lumpectomyand is currently onadjuvant chemotherapy treatment. She  completed 4 cycles ofdense Adriamycin and Cytoxanand is currently receiving weekly Taxol.She presents to the clinic todayforcycle8.   ALLERGIES:  is allergic to aspirin; ibuprofen; hydrocodone; oxycodone; and phenergan [promethazine].  MEDICATIONS:  Current Outpatient Medications  Medication Sig Dispense Refill  . acetaminophen (TYLENOL) 325 MG tablet Take 650 mg by mouth every 6 (six) hours as needed.    . clindamycin (CLINDAGEL) 1 % gel Apply topically 2 (two) times daily. 30 g 1  . lidocaine-prilocaine (EMLA) cream Apply to affected area once 30 g 3  . LORazepam (ATIVAN) 0.5 MG tablet Take 1 tablet (0.5 mg total) by mouth at bedtime as needed for sleep. 30 tablet 2  . ondansetron (ZOFRAN) 8 MG tablet Take 1 tablet (8 mg total) by mouth 2 (two) times daily as needed. Start on the third day after chemotherapy. 30 tablet 1  . prochlorperazine (COMPAZINE) 10 MG tablet Take 1 tablet (10 mg total) by mouth every 6 (six) hours as needed (Nausea or vomiting). 30 tablet 1  . traMADol (ULTRAM) 50 MG tablet Take 1 tablet (50 mg total) by mouth every 6 (six) hours as needed. 60 tablet 0  . venlafaxine XR (EFFEXOR XR) 37.5 MG 24 hr capsule Take 1 capsule (37.5 mg total) by mouth daily with breakfast. 30 capsule 3   No current facility-administered medications for this visit.   Facility-Administered Medications Ordered in Other Visits  Medication Dose Route Frequency Provider Last Rate Last Admin  . sodium chloride flush (NS) 0.9 % injection 10 mL  10 mL Intracatheter PRN Caitlin Lose, MD   10 mL at 01/17/19 0849    PHYSICAL EXAMINATION: ECOG PERFORMANCE STATUS: 1 - Symptomatic but completely ambulatory  There were no vitals filed for this visit. There were no vitals filed for this visit.  LABORATORY DATA:  I have reviewed the data as listed CMP Latest Ref Rng & Units 01/10/2019 01/03/2019 12/27/2018  Glucose 70 - 99 mg/dL 104(H) 91 80  BUN 6 - 20 mg/dL _0 Creatinine 0.44 - 1.00  mg/dL 0.71 0.72 0.75  Sodium 135 - 145 mmol/L 142 140 140  Potassium 3.5 - 5.1 mmol/L 4.1 4.3 3.8  Chloride 98 - 111 mmol/L 108 106 107  CO2 22 - 32 mmol/L _1 Calcium 8.9 - 10.3 mg/dL 8.7(L) 8.9 8.5(L)  Total Protein 6.5 - 8.1 g/dL 6.2(L) 6.7 6.5  Total Bilirubin 0.3 - 1.2 mg/dL 0.5 0.4 0.4  Alkaline Phos 38 - 126 U/L 89 90 85  AST 15 - 41 U/L 12(L) 15 13(L)  ALT 0 - 44 U/L _2 Lab Results  Component Value Date   WBC 4.0 01/10/2019   HGB 10.5 (L) 01/10/2019   HCT 31.7 (L) 01/10/2019   MCV 99.1 01/10/2019   PLT 230 01/10/2019   NEUTROABS 2.5 01/10/2019    ASSESSMENT & PLAN:  Malignant neoplasm of upper-inner quadrant of left breast in female, estrogen receptor positive ([place]) 07/05/2018:Screening mammogram detected bilateral breast masses. Diagnostic mammogram and US showed cysts in the right breast and a 1.2cm left breast mass at the 9 o'clock position. 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 Stage Ia Brief course of neoadjuvant tamoxifen therapy started 07/07/2018 08/04/2018: Left lumpectomy:Left lumpectomy Caitlin Graham): IDC with intermediate grade DCIS, 1.2cm, grade 1, clear margins, with 3 axillary lymph nodes negative for carcinoma ER 100%, PR  60%, Ki-67 50%  Oncotype DX score: 32, distant recurrence at 9 years with hormone therapy alone: 20%  Treatment plan: 1.Adjuvant chemotherapy with dose dense Adriamycin and Cytoxan x4 followed by Taxol x12 2.Given radiation therapy followed by 3.Adjuvant antiestrogen therapy ----------------------------------------------------------------------------------------------------------------------------- Current treatment:Completed 4 cycles ofdose dense Adriamycin Cytoxan, today cycle8Taxol  Chemo toxicities: 1.  Chemotherapy-induced anemia: Being monitored today's hemoglobin is 11.3 2.  Severe fatigue 3.  Symptoms of depression: On Effexor 37.5 mg 4.  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  Leukopenia: Reduce the dosage of Taxol. Prior Covid positivity: Chemo was held briefly and resumed  Return to clinic weekly for Taxol and every other week for follow-up with me     No orders of the defined types were placed in this encounter.  The patient has a good understanding of the overall plan. she agrees with it. she will call with any problems that may develop before the next visit here.  Total time spent: 30 mins including face to face time and time spent for planning, charting and coordination of care  Rulon Eisenmenger, MD, MPH 01/17/2019  I, Molly Dorshimer, am acting as scribe for Dr. Nicholas Graham.  I have reviewed the above documentation for accuracy and completeness, and I agree with the above.

## 2019-01-17 ENCOUNTER — Inpatient Hospital Stay: Payer: BC Managed Care – PPO

## 2019-01-17 ENCOUNTER — Inpatient Hospital Stay: Payer: BC Managed Care – PPO | Admitting: Hematology and Oncology

## 2019-01-17 ENCOUNTER — Other Ambulatory Visit: Payer: Self-pay

## 2019-01-17 ENCOUNTER — Encounter: Payer: Self-pay | Admitting: *Deleted

## 2019-01-17 DIAGNOSIS — C50212 Malignant neoplasm of upper-inner quadrant of left female breast: Secondary | ICD-10-CM | POA: Diagnosis not present

## 2019-01-17 DIAGNOSIS — Z5111 Encounter for antineoplastic chemotherapy: Secondary | ICD-10-CM | POA: Diagnosis not present

## 2019-01-17 DIAGNOSIS — Z170 Estrogen receptor positive status [ER+]: Secondary | ICD-10-CM | POA: Diagnosis not present

## 2019-01-17 DIAGNOSIS — Z95828 Presence of other vascular implants and grafts: Secondary | ICD-10-CM

## 2019-01-17 LAB — CMP (CANCER CENTER ONLY)
ALT: 18 U/L (ref 0–44)
AST: 13 U/L — ABNORMAL LOW (ref 15–41)
Albumin: 3.5 g/dL (ref 3.5–5.0)
Alkaline Phosphatase: 88 U/L (ref 38–126)
Anion gap: 10 (ref 5–15)
BUN: 14 mg/dL (ref 6–20)
CO2: 22 mmol/L (ref 22–32)
Calcium: 8.8 mg/dL — ABNORMAL LOW (ref 8.9–10.3)
Chloride: 107 mmol/L (ref 98–111)
Creatinine: 0.74 mg/dL (ref 0.44–1.00)
GFR, Est AFR Am: >60 mL/min (ref >60)
GFR, Estimated: >60 mL/min (ref >60)
Glucose, Bld: 99 mg/dL (ref 70–99)
Potassium: 4.4 mmol/L (ref 3.5–5.1)
Sodium: 139 mmol/L (ref 135–145)
Total Bilirubin: 0.4 mg/dL (ref 0.3–1.2)
Total Protein: 6.6 g/dL (ref 6.5–8.1)

## 2019-01-17 LAB — CBC WITH DIFFERENTIAL (CANCER CENTER ONLY)
Abs Immature Granulocytes: 0.15 10*3/uL — ABNORMAL HIGH (ref 0.00–0.07)
Basophils Absolute: 0.1 10*3/uL (ref 0.0–0.1)
Basophils Relative: 1 %
Eosinophils Absolute: 0.1 10*3/uL (ref 0.0–0.5)
Eosinophils Relative: 2 %
HCT: 34.3 % — ABNORMAL LOW (ref 36.0–46.0)
Hemoglobin: 11.3 g/dL — ABNORMAL LOW (ref 12.0–15.0)
Immature Granulocytes: 3 %
Lymphocytes Relative: 23 %
Lymphs Abs: 1.1 10*3/uL (ref 0.7–4.0)
MCH: 33 pg (ref 26.0–34.0)
MCHC: 32.9 g/dL (ref 30.0–36.0)
MCV: 100.3 fL — ABNORMAL HIGH (ref 80.0–100.0)
Monocytes Absolute: 0.5 10*3/uL (ref 0.1–1.0)
Monocytes Relative: 11 %
Neutro Abs: 2.8 10*3/uL (ref 1.7–7.7)
Neutrophils Relative %: 60 %
Platelet Count: 275 10*3/uL (ref 150–400)
RBC: 3.42 MIL/uL — ABNORMAL LOW (ref 3.87–5.11)
RDW: 14.7 % (ref 11.5–15.5)
WBC Count: 4.6 10*3/uL (ref 4.0–10.5)
nRBC: 0 % (ref 0.0–0.2)

## 2019-01-17 MED ORDER — DIPHENHYDRAMINE HCL 50 MG/ML IJ SOLN
INTRAMUSCULAR | Status: AC
  Filled 2019-01-17: qty 1

## 2019-01-17 MED ORDER — SODIUM CHLORIDE 0.9 % IV SOLN
20.0000 mg | Freq: Once | INTRAVENOUS | Status: AC
  Administered 2019-01-17: 20 mg via INTRAVENOUS
  Filled 2019-01-17: qty 20

## 2019-01-17 MED ORDER — DIPHENHYDRAMINE HCL 50 MG/ML IJ SOLN
25.0000 mg | Freq: Once | INTRAMUSCULAR | Status: AC
  Administered 2019-01-17: 25 mg via INTRAVENOUS

## 2019-01-17 MED ORDER — SODIUM CHLORIDE 0.9 % IV SOLN
65.0000 mg/m2 | Freq: Once | INTRAVENOUS | Status: AC
  Administered 2019-01-17: 156 mg via INTRAVENOUS
  Filled 2019-01-17: qty 26

## 2019-01-17 MED ORDER — SODIUM CHLORIDE 0.9% FLUSH
10.0000 mL | INTRAVENOUS | Status: DC | PRN
  Administered 2019-01-17: 09:00:00 10 mL
  Filled 2019-01-17: qty 10

## 2019-01-17 MED ORDER — HEPARIN SOD (PORK) LOCK FLUSH 100 UNIT/ML IV SOLN
500.0000 [IU] | Freq: Once | INTRAVENOUS | Status: AC | PRN
  Administered 2019-01-17: 500 [IU]
  Filled 2019-01-17: qty 5

## 2019-01-17 MED ORDER — FAMOTIDINE IN NACL 20-0.9 MG/50ML-% IV SOLN
INTRAVENOUS | Status: AC
  Filled 2019-01-17: qty 50

## 2019-01-17 MED ORDER — SODIUM CHLORIDE 0.9 % IV SOLN
Freq: Once | INTRAVENOUS | Status: AC
  Filled 2019-01-17: qty 250

## 2019-01-17 MED ORDER — FAMOTIDINE IN NACL 20-0.9 MG/50ML-% IV SOLN
20.0000 mg | Freq: Once | INTRAVENOUS | Status: AC
  Administered 2019-01-17: 20 mg via INTRAVENOUS

## 2019-01-17 MED ORDER — SODIUM CHLORIDE 0.9% FLUSH
10.0000 mL | INTRAVENOUS | Status: DC | PRN
  Administered 2019-01-17: 10 mL
  Filled 2019-01-17: qty 10

## 2019-01-17 NOTE — Assessment & Plan Note (Addendum)
07/05/2018:Screening mammogram detected bilateral breast masses. Diagnostic mammogram and US showed cysts in the right breast and a 1.2cm left breast mass at the 9 o'clock position. 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 Stage Ia Brief course of neoadjuvant tamoxifen therapy started 07/07/2018 08/04/2018: Left lumpectomy:Left lumpectomy [name]): IDC with intermediate grade DCIS, 1.2cm, grade 1, clear margins, with 3 axillary lymph nodes negative for carcinoma ER 100%, PR 60%, Ki-67 50%  Oncotype DX score: 32, distant recurrence at 9 years with hormone therapy alone: 20%  Treatment plan: 1.Adjuvant chemotherapy with dose dense Adriamycin and Cytoxan x4 followed by Taxol x12 2.Given radiation therapy followed by 3.Adjuvant antiestrogen therapy ----------------------------------------------------------------------------------------------------------------------------- Current treatment:Completed 4 cycles ofdose dense Adriamycin Cytoxan, today cycle8Taxol  Chemo toxicities: 1.  Chemotherapy-induced anemia: Being monitored 2.  Severe fatigue 3.  Symptoms of depression: On Effexor 37.5 mg 4.  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  Leukopenia: Reduce the dosage of Taxol. Prior Covid positivity: Chemo was held briefly and resumed  Return to clinic weekly for Taxol and every other week for follow-up with me

## 2019-01-17 NOTE — Patient Instructions (Signed)
[facility] Discharge Instructions for Patients Receiving Chemotherapy  Today you received the following chemotherapy agents:  Taxol.  To help prevent nausea and vomiting after your treatment, we encourage you to take your nausea medication as directed.   If you develop nausea and vomiting that is not controlled by your nausea medication, call the clinic.   BELOW ARE SYMPTOMS THAT SHOULD BE REPORTED IMMEDIATELY:  *FEVER GREATER THAN 100.5 F  *CHILLS WITH OR WITHOUT FEVER  NAUSEA AND VOMITING THAT IS NOT CONTROLLED WITH YOUR NAUSEA MEDICATION  *UNUSUAL SHORTNESS OF BREATH  *UNUSUAL BRUISING OR BLEEDING  TENDERNESS IN MOUTH AND THROAT WITH OR WITHOUT PRESENCE OF ULCERS  *URINARY PROBLEMS  *BOWEL PROBLEMS  UNUSUAL RASH Items with * indicate a potential emergency and should be followed up as soon as possible.  Feel free to call the clinic should you have any questions or concerns. The clinic phone number is [phone].  Please show the CHEMO ALERT CARD at check-in to the Emergency Department and triage nurse.   

## 2019-01-24 ENCOUNTER — Inpatient Hospital Stay: Payer: BC Managed Care – PPO

## 2019-01-24 ENCOUNTER — Other Ambulatory Visit: Payer: Self-pay

## 2019-01-24 VITALS — BP 114/67 | HR 72 | Temp 98.2°F | Resp 18

## 2019-01-24 DIAGNOSIS — Z170 Estrogen receptor positive status [ER+]: Secondary | ICD-10-CM

## 2019-01-24 DIAGNOSIS — Z95828 Presence of other vascular implants and grafts: Secondary | ICD-10-CM

## 2019-01-24 DIAGNOSIS — C50212 Malignant neoplasm of upper-inner quadrant of left female breast: Secondary | ICD-10-CM

## 2019-01-24 DIAGNOSIS — Z5111 Encounter for antineoplastic chemotherapy: Secondary | ICD-10-CM | POA: Diagnosis not present

## 2019-01-24 LAB — CMP (CANCER CENTER ONLY)
ALT: 18 U/L (ref 0–44)
AST: 12 U/L — ABNORMAL LOW (ref 15–41)
Albumin: 3.3 g/dL — ABNORMAL LOW (ref 3.5–5.0)
Alkaline Phosphatase: 87 U/L (ref 38–126)
Anion gap: 8 (ref 5–15)
BUN: 13 mg/dL (ref 6–20)
CO2: 24 mmol/L (ref 22–32)
Calcium: 8.5 mg/dL — ABNORMAL LOW (ref 8.9–10.3)
Chloride: 108 mmol/L (ref 98–111)
Creatinine: 0.72 mg/dL (ref 0.44–1.00)
GFR, Est AFR Am: >60 mL/min (ref >60)
GFR, Estimated: >60 mL/min (ref >60)
Glucose, Bld: 90 mg/dL (ref 70–99)
Potassium: 4.1 mmol/L (ref 3.5–5.1)
Sodium: 140 mmol/L (ref 135–145)
Total Bilirubin: 0.4 mg/dL (ref 0.3–1.2)
Total Protein: 6.3 g/dL — ABNORMAL LOW (ref 6.5–8.1)

## 2019-01-24 LAB — CBC WITH DIFFERENTIAL (CANCER CENTER ONLY)
Abs Immature Granulocytes: 0.14 10*3/uL — ABNORMAL HIGH (ref 0.00–0.07)
Basophils Absolute: 0 10*3/uL (ref 0.0–0.1)
Basophils Relative: 1 %
Eosinophils Absolute: 0.1 10*3/uL (ref 0.0–0.5)
Eosinophils Relative: 1 %
HCT: 33.5 % — ABNORMAL LOW (ref 36.0–46.0)
Hemoglobin: 10.8 g/dL — ABNORMAL LOW (ref 12.0–15.0)
Immature Granulocytes: 3 %
Lymphocytes Relative: 20 %
Lymphs Abs: 1 10*3/uL (ref 0.7–4.0)
MCH: 32.3 pg (ref 26.0–34.0)
MCHC: 32.2 g/dL (ref 30.0–36.0)
MCV: 100.3 fL — ABNORMAL HIGH (ref 80.0–100.0)
Monocytes Absolute: 0.5 10*3/uL (ref 0.1–1.0)
Monocytes Relative: 10 %
Neutro Abs: 3.2 10*3/uL (ref 1.7–7.7)
Neutrophils Relative %: 65 %
Platelet Count: 240 10*3/uL (ref 150–400)
RBC: 3.34 MIL/uL — ABNORMAL LOW (ref 3.87–5.11)
RDW: 14.6 % (ref 11.5–15.5)
WBC Count: 5 10*3/uL (ref 4.0–10.5)
nRBC: 0 % (ref 0.0–0.2)

## 2019-01-24 MED ORDER — FAMOTIDINE IN NACL 20-0.9 MG/50ML-% IV SOLN
20.0000 mg | Freq: Once | INTRAVENOUS | Status: AC
  Administered 2019-01-24: 20 mg via INTRAVENOUS

## 2019-01-24 MED ORDER — SODIUM CHLORIDE 0.9% FLUSH
10.0000 mL | INTRAVENOUS | Status: DC | PRN
  Administered 2019-01-24: 10 mL
  Filled 2019-01-24: qty 10

## 2019-01-24 MED ORDER — SODIUM CHLORIDE 0.9 % IV SOLN
Freq: Once | INTRAVENOUS | Status: AC
  Filled 2019-01-24: qty 250

## 2019-01-24 MED ORDER — DIPHENHYDRAMINE HCL 50 MG/ML IJ SOLN
25.0000 mg | Freq: Once | INTRAMUSCULAR | Status: AC
  Administered 2019-01-24: 10:00:00 25 mg via INTRAVENOUS

## 2019-01-24 MED ORDER — FAMOTIDINE IN NACL 20-0.9 MG/50ML-% IV SOLN
INTRAVENOUS | Status: AC
  Filled 2019-01-24: qty 50

## 2019-01-24 MED ORDER — SODIUM CHLORIDE 0.9 % IV SOLN
20.0000 mg | Freq: Once | INTRAVENOUS | Status: AC
  Administered 2019-01-24: 20 mg via INTRAVENOUS
  Filled 2019-01-24: qty 20

## 2019-01-24 MED ORDER — HEPARIN SOD (PORK) LOCK FLUSH 100 UNIT/ML IV SOLN
500.0000 [IU] | Freq: Once | INTRAVENOUS | Status: AC | PRN
  Administered 2019-01-24: 500 [IU]
  Filled 2019-01-24: qty 5

## 2019-01-24 MED ORDER — SODIUM CHLORIDE 0.9 % IV SOLN
65.0000 mg/m2 | Freq: Once | INTRAVENOUS | Status: AC
  Administered 2019-01-24: 156 mg via INTRAVENOUS
  Filled 2019-01-24: qty 26

## 2019-01-24 MED ORDER — DIPHENHYDRAMINE HCL 50 MG/ML IJ SOLN
INTRAMUSCULAR | Status: AC
  Filled 2019-01-24: qty 1

## 2019-01-24 NOTE — Patient Instructions (Signed)

## 2019-01-30 NOTE — Progress Notes (Signed)
Patient Care Team: Patient, [name] as PCP - General (General Practice) Caitlin Kaufmann, RN as Oncology Nurse Navigator Caitlin Germany, RN as Oncology Nurse Navigator Caitlin Lose, MD as Consulting Physician (Hematology and Oncology) Caitlin Kussmaul, MD as Consulting Physician (General Surgery) Caitlin Gibson, MD as Attending Physician (Radiation Oncology)  DIAGNOSIS:    ICD-10-CM   1. Malignant neoplasm of upper-inner quadrant of left breast in female, estrogen receptor positive ([place])  C50.212    Z17.0     SUMMARY OF ONCOLOGIC HISTORY: Oncology History  Malignant neoplasm of upper-inner quadrant of left breast in female, estrogen receptor positive ([place])  07/05/2018 Initial Diagnosis   Screening mammogram detected bilateral breast masses. Diagnostic mammogram and US showed cysts in the right breast and a 1.2cm left breast mass at the 9 o'clock position. 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    07/07/2018 Cancer Staging   Staging form: Breast, AJCC 8th Edition - Clinical stage from 07/07/2018: Stage IA (cT1c, cN0, cM0, G2, ER+, PR+, HER2-) - Signed by Caitlin Lose, MD on 07/07/2018    Genetic Testing   Negative testing. No pathogenic variants identified on the [employer] Common Hereditary Cancers Panel. The Common Hereditary Cancers Panel offered by [facility] includes sequencing and/or deletion duplication testing of the following 47 genes: APC, ATM, AXIN2, BARD1, BMPR1A, BRCA1, BRCA2, BRIP1, CDH1, CDKN2A (p14ARF), CDKN2A (p16INK4a), CKD4, CHEK2, CTNNA1, DICER1, EPCAM (Deletion/duplication testing only), GREM1 (promoter region deletion/duplication testing only), KIT, MEN1, MLH1, MSH2, MSH3, MSH6, MUTYH, NBN, NF1, NHTL1, PALB2, PDGFRA, PMS2, POLD1, POLE, PTEN, RAD50, RAD51C, RAD51D, SDHB, SDHC, SDHD, SMAD4, SMARCA4. STK11, TP53, TSC1, TSC2, and VHL.  The following genes were evaluated for sequence changes only: SDHA and HOXB13 c.251G>A variant only. The  report date is 07/18/2018.   08/04/2018 Surgery   Left lumpectomy Caitlin Graham): IDC with intermediate grade DCIS, 1.2cm, grade 1, clear margins, with 3 axillary lymph nodes negative for carcinoma ER 100%, PR 60%, Ki-67 50%   09/07/2018 -  Chemotherapy   The patient had DOXOrubicin (ADRIAMYCIN) chemo injection 146 mg, 60 mg/m2 = 146 mg, Intravenous,  Once, 4 of 4 cycles Administration: 146 mg (09/07/2018), 146 mg (09/20/2018), 146 mg (10/04/2018), 146 mg (10/18/2018) palonosetron (ALOXI) injection 0.25 mg, 0.25 mg, Intravenous,  Once, 4 of 4 cycles Administration: 0.25 mg (09/07/2018), 0.25 mg (09/20/2018), 0.25 mg (10/04/2018), 0.25 mg (10/18/2018) pegfilgrastim-jmdb (FULPHILA) injection 6 mg, 6 mg, Subcutaneous,  Once, 4 of 4 cycles Administration: 6 mg (09/09/2018), 6 mg (09/22/2018), 6 mg (10/06/2018), 6 mg (10/20/2018) cyclophosphamide (CYTOXAN) 1,460 mg in sodium chloride 0.9 % 250 mL chemo infusion, 600 mg/m2 = 1,460 mg, Intravenous,  Once, 4 of 4 cycles Administration: 1,460 mg (09/07/2018), 1,460 mg (09/20/2018), 1,460 mg (10/04/2018), 1,460 mg (10/18/2018) PACLitaxel (TAXOL) 198 mg in sodium chloride 0.9 % 250 mL chemo infusion (</= '80mg'$ /m2), 80 mg/m2 = 198 mg, Intravenous,  Once, 9 of 12 cycles Dose modification: 65 mg/m2 (original dose 80 mg/m2, Cycle 9, Reason: Provider Judgment, Comment: leukopenia), 50 mg/m2 (original dose 80 mg/m2, Cycle 14, Reason: Dose not tolerated) Administration: 198 mg (11/01/2018), 198 mg (12/06/2018), 198 mg (12/13/2018), 156 mg (12/20/2018), 156 mg (12/27/2018), 156 mg (01/03/2019), 156 mg (01/10/2019), 156 mg (01/17/2019), 156 mg (01/24/2019) fosaprepitant (EMEND) 150 mg, dexamethasone (DECADRON) 12 mg in sodium chloride 0.9 % 145 mL IVPB, , Intravenous,  Once, 4 of 4 cycles Administration:  (09/07/2018),  (09/20/2018),  (10/04/2018),  (10/18/2018)  for chemotherapy treatment.  CHIEF COMPLIANT: Cycle10Taxol  INTERVAL HISTORY: Caitlin Graham is a 54 y.o. with above-mentioned history  of left breast cancer who underwent a lumpectomyand is currently onadjuvant chemotherapy treatment. She completed 4 cycles ofdense Adriamycin and Cytoxanand is currently receiving weekly Taxol.She presents to the clinic todayforcycle10. She reports mild neuropathy in the tips of the fingers as well as slightly on the bottom of the feet. She does complain of profound fatigue which is bothering her.  She feels tired very easily.  She also has diffuse body aches and pains.  ALLERGIES:  is allergic to aspirin; ibuprofen; hydrocodone; oxycodone; and phenergan [promethazine].  MEDICATIONS:  Current Outpatient Medications  Medication Sig Dispense Refill  . acetaminophen (TYLENOL) 325 MG tablet Take 650 mg by mouth every 6 (six) hours as needed.    . clindamycin (CLINDAGEL) 1 % gel Apply topically 2 (two) times daily. 30 g 1  . lidocaine-prilocaine (EMLA) cream Apply to affected area once 30 g 3  . LORazepam (ATIVAN) 0.5 MG tablet Take 1 tablet (0.5 mg total) by mouth at bedtime as needed for sleep. 30 tablet 2  . ondansetron (ZOFRAN) 8 MG tablet Take 1 tablet (8 mg total) by mouth 2 (two) times daily as needed. Start on the third day after chemotherapy. 30 tablet 1  . prochlorperazine (COMPAZINE) 10 MG tablet Take 1 tablet (10 mg total) by mouth every 6 (six) hours as needed (Nausea or vomiting). 30 tablet 1  . traMADol (ULTRAM) 50 MG tablet Take 1 tablet (50 mg total) by mouth every 6 (six) hours as needed. 60 tablet 0  . venlafaxine XR (EFFEXOR XR) 37.5 MG 24 hr capsule Take 1 capsule (37.5 mg total) by mouth daily with breakfast. 30 capsule 3   No current facility-administered medications for this visit.    PHYSICAL EXAMINATION: ECOG PERFORMANCE STATUS: 1 - Symptomatic but completely ambulatory  Vitals:   01/31/19 0910  BP: (!) 97/46  Pulse: 74  Resp: 18  Temp: 98.2 F (36.8 C)  SpO2: 100%   Filed Weights   01/31/19 0910  Weight: 276 lb 9.6 oz (125.5 kg)    LABORATORY DATA:    I have reviewed the data as listed CMP Latest Ref Rng & Units 01/24/2019 01/17/2019 01/10/2019  Glucose 70 - 99 mg/dL 90 99 104(H)  BUN 6 - 20 mg/dL '13 14 11  '$ Creatinine 0.44 - 1.00 mg/dL 0.72 0.74 0.71  Sodium 135 - 145 mmol/L 140 139 142  Potassium 3.5 - 5.1 mmol/L 4.1 4.4 4.1  Chloride 98 - 111 mmol/L 108 107 108  CO2 22 - 32 mmol/L '24 22 24  '$ Calcium 8.9 - 10.3 mg/dL 8.5(L) 8.8(L) 8.7(L)  Total Protein 6.5 - 8.1 g/dL 6.3(L) 6.6 6.2(L)  Total Bilirubin 0.3 - 1.2 mg/dL 0.4 0.4 0.5  Alkaline Phos 38 - 126 U/L 87 88 89  AST 15 - 41 U/L 12(L) 13(L) 12(L)  ALT 0 - 44 U/L '18 18 17    '$ Lab Results  Component Value Date   WBC 4.8 01/31/2019   HGB 11.1 (L) 01/31/2019   HCT 34.2 (L) 01/31/2019   MCV 98.6 01/31/2019   PLT 263 01/31/2019   NEUTROABS 2.8 01/31/2019    ASSESSMENT & PLAN:  Malignant neoplasm of upper-inner quadrant of left breast in female, estrogen receptor positive ([place]) 07/05/2018:Screening mammogram detected bilateral breast masses. Diagnostic mammogram and US showed cysts in the right breast and a 1.2cm left breast mass at the 9 o'clock position. Biopsy confirmed grade 1-2 IDC, HER-2 negative (  1+), ER 100%, PR 60%, Ki67 50%. Stage Ia Brief course of neoadjuvant tamoxifen therapy started 07/07/2018 08/04/2018: Left lumpectomy:Left lumpectomy Caitlin Graham): IDC with intermediate grade DCIS, 1.2cm, grade 1, clear margins, with 3 axillary lymph nodes negative for carcinoma ER 100%, PR 60%, Ki-67 50%  Oncotype DX score: 32, distant recurrence at 9 years with hormone therapy alone: 20%  Treatment plan: 1.Adjuvant chemotherapy with dose dense Adriamycin and Cytoxan x4 followed by Taxol x12 2.radiation therapy followed by 3.Adjuvant antiestrogen therapy ----------------------------------------------------------------------------------------------------------------------------- Current treatment:Completed 4 cycles ofdose dense Adriamycin Cytoxan, today cycle10Taxol  Chemo  toxicities: 1.  Chemotherapy-induced anemia: Being monitored today's hemoglobin is  2.  Severe fatigue: Waxes and wanes 3.  Symptoms of depression: On Effexor 37.5 mg, doing better 4.  Chemo-induced peripheral neuropathy: Slight worsening.  We will further reduce the dosage of Taxol 5.  Leukopenia: Reduce the dosage of Taxol. stable  Prior Covid positivity: Chemo was held briefly and resumed Patient wishes to receive radiation [place]. We will get an appointment for her to discuss it. After the chemo is done, we will remove the port.  Return to clinic weekly for Taxol and every other week for follow-up with me in 2 weeks to finish her last cycle of chemo.    No orders of the defined types were placed in this encounter.  The patient has a good understanding of the overall plan. she agrees with it. she will call with any problems that may develop before the next visit here.  Total time spent: 30 mins including face to face time and time spent for planning, charting and coordination of care  Rulon Eisenmenger, MD, MPH 01/31/2019  I, Molly Dorshimer, am acting as scribe for Dr. Nicholas Graham.  I have reviewed the above documentation for accuracy and completeness, and I agree with the above.

## 2019-01-31 ENCOUNTER — Other Ambulatory Visit: Payer: Self-pay

## 2019-01-31 ENCOUNTER — Encounter: Payer: Self-pay | Admitting: *Deleted

## 2019-01-31 ENCOUNTER — Inpatient Hospital Stay: Payer: BC Managed Care – PPO | Admitting: Hematology and Oncology

## 2019-01-31 ENCOUNTER — Inpatient Hospital Stay: Payer: BC Managed Care – PPO

## 2019-01-31 DIAGNOSIS — C50212 Malignant neoplasm of upper-inner quadrant of left female breast: Secondary | ICD-10-CM | POA: Diagnosis not present

## 2019-01-31 DIAGNOSIS — Z5111 Encounter for antineoplastic chemotherapy: Secondary | ICD-10-CM | POA: Diagnosis not present

## 2019-01-31 DIAGNOSIS — Z170 Estrogen receptor positive status [ER+]: Secondary | ICD-10-CM

## 2019-01-31 DIAGNOSIS — Z95828 Presence of other vascular implants and grafts: Secondary | ICD-10-CM

## 2019-01-31 LAB — CMP (CANCER CENTER ONLY)
ALT: 17 U/L (ref 0–44)
AST: 13 U/L — ABNORMAL LOW (ref 15–41)
Albumin: 3.5 g/dL (ref 3.5–5.0)
Alkaline Phosphatase: 88 U/L (ref 38–126)
Anion gap: 8 (ref 5–15)
BUN: 13 mg/dL (ref 6–20)
CO2: 26 mmol/L (ref 22–32)
Calcium: 9.3 mg/dL (ref 8.9–10.3)
Chloride: 105 mmol/L (ref 98–111)
Creatinine: 0.77 mg/dL (ref 0.44–1.00)
GFR, Est AFR Am: >60 mL/min (ref >60)
GFR, Estimated: >60 mL/min (ref >60)
Glucose, Bld: 98 mg/dL (ref 70–99)
Potassium: 4.3 mmol/L (ref 3.5–5.1)
Sodium: 139 mmol/L (ref 135–145)
Total Bilirubin: 0.4 mg/dL (ref 0.3–1.2)
Total Protein: 6.5 g/dL (ref 6.5–8.1)

## 2019-01-31 LAB — CBC WITH DIFFERENTIAL (CANCER CENTER ONLY)
Abs Immature Granulocytes: 0.18 10*3/uL — ABNORMAL HIGH (ref 0.00–0.07)
Basophils Absolute: 0.1 10*3/uL (ref 0.0–0.1)
Basophils Relative: 1 %
Eosinophils Absolute: 0.1 10*3/uL (ref 0.0–0.5)
Eosinophils Relative: 3 %
HCT: 34.2 % — ABNORMAL LOW (ref 36.0–46.0)
Hemoglobin: 11.1 g/dL — ABNORMAL LOW (ref 12.0–15.0)
Immature Granulocytes: 4 %
Lymphocytes Relative: 23 %
Lymphs Abs: 1.1 10*3/uL (ref 0.7–4.0)
MCH: 32 pg (ref 26.0–34.0)
MCHC: 32.5 g/dL (ref 30.0–36.0)
MCV: 98.6 fL (ref 80.0–100.0)
Monocytes Absolute: 0.5 10*3/uL (ref 0.1–1.0)
Monocytes Relative: 11 %
Neutro Abs: 2.8 10*3/uL (ref 1.7–7.7)
Neutrophils Relative %: 58 %
Platelet Count: 263 10*3/uL (ref 150–400)
RBC: 3.47 MIL/uL — ABNORMAL LOW (ref 3.87–5.11)
RDW: 14.6 % (ref 11.5–15.5)
WBC Count: 4.8 10*3/uL (ref 4.0–10.5)
nRBC: 0 % (ref 0.0–0.2)

## 2019-01-31 MED ORDER — DEXAMETHASONE SODIUM PHOSPHATE 10 MG/ML IJ SOLN
INTRAMUSCULAR | Status: AC
  Filled 2019-01-31: qty 1

## 2019-01-31 MED ORDER — SODIUM CHLORIDE 0.9 % IV SOLN
Freq: Once | INTRAVENOUS | Status: AC
  Filled 2019-01-31: qty 250

## 2019-01-31 MED ORDER — DIPHENHYDRAMINE HCL 50 MG/ML IJ SOLN
25.0000 mg | Freq: Once | INTRAMUSCULAR | Status: AC
  Administered 2019-01-31: 25 mg via INTRAVENOUS

## 2019-01-31 MED ORDER — SODIUM CHLORIDE 0.9% FLUSH
10.0000 mL | INTRAVENOUS | Status: DC | PRN
  Administered 2019-01-31: 10 mL
  Filled 2019-01-31: qty 10

## 2019-01-31 MED ORDER — SODIUM CHLORIDE 0.9 % IV SOLN
50.0000 mg/m2 | Freq: Once | INTRAVENOUS | Status: AC
  Administered 2019-01-31: 120 mg via INTRAVENOUS
  Filled 2019-01-31: qty 20

## 2019-01-31 MED ORDER — FAMOTIDINE IN NACL 20-0.9 MG/50ML-% IV SOLN
INTRAVENOUS | Status: AC
  Filled 2019-01-31: qty 50

## 2019-01-31 MED ORDER — SODIUM CHLORIDE 0.9 % IV SOLN
20.0000 mg | Freq: Once | INTRAVENOUS | Status: AC
  Administered 2019-01-31: 20 mg via INTRAVENOUS
  Filled 2019-01-31: qty 20

## 2019-01-31 MED ORDER — FAMOTIDINE IN NACL 20-0.9 MG/50ML-% IV SOLN
20.0000 mg | Freq: Once | INTRAVENOUS | Status: AC
  Administered 2019-01-31: 20 mg via INTRAVENOUS

## 2019-01-31 MED ORDER — DIPHENHYDRAMINE HCL 50 MG/ML IJ SOLN
INTRAMUSCULAR | Status: AC
  Filled 2019-01-31: qty 1

## 2019-01-31 MED ORDER — HEPARIN SOD (PORK) LOCK FLUSH 100 UNIT/ML IV SOLN
500.0000 [IU] | Freq: Once | INTRAVENOUS | Status: AC | PRN
  Administered 2019-01-31: 500 [IU]
  Filled 2019-01-31: qty 5

## 2019-01-31 NOTE — Assessment & Plan Note (Signed)
07/05/2018:Screening mammogram detected bilateral breast masses. Diagnostic mammogram and US showed cysts in the right breast and a 1.2cm left breast mass at the 9 o'clock position. 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 Stage Ia Brief course of neoadjuvant tamoxifen therapy started 07/07/2018 08/04/2018: Left lumpectomy:Left lumpectomy [name]): IDC with intermediate grade DCIS, 1.2cm, grade 1, clear margins, with 3 axillary lymph nodes negative for carcinoma ER 100%, PR 60%, Ki-67 50%  Oncotype DX score: 32, distant recurrence at 9 years with hormone therapy alone: 20%  Treatment plan: 1.Adjuvant chemotherapy with dose dense Adriamycin and Cytoxan x4 followed by Taxol x12 2.radiation therapy followed by 3.Adjuvant antiestrogen therapy ----------------------------------------------------------------------------------------------------------------------------- Current treatment:Completed 4 cycles ofdose dense Adriamycin Cytoxan, today cycle10Taxol  Chemo toxicities: 1.  Chemotherapy-induced anemia: Being monitored today's hemoglobin is  2.  Severe fatigue: Waxes and wanes 3.  Symptoms of depression: On Effexor 37.5 mg, doing better 4.  Chemo-induced peripheral neuropathy: Stable 5.  Leukopenia: Reduce the dosage of Taxol. stable Prior Covid positivity: Chemo was held briefly and resumed  Return to clinic weekly for Taxol and every other week for follow-up with me in 2 weeks to finish her last cycle of chemo.

## 2019-02-02 ENCOUNTER — Telehealth: Payer: Self-pay | Admitting: *Deleted

## 2019-02-02 NOTE — Telephone Encounter (Signed)
Referral faxed to [place] to  Radiation. Release  [id number]

## 2019-02-07 ENCOUNTER — Inpatient Hospital Stay: Payer: BC Managed Care – PPO

## 2019-02-07 ENCOUNTER — Other Ambulatory Visit: Payer: Self-pay

## 2019-02-07 ENCOUNTER — Inpatient Hospital Stay: Payer: BC Managed Care – PPO | Attending: Hematology and Oncology

## 2019-02-07 VITALS — BP 111/57 | HR 68 | Temp 98.1°F | Resp 18

## 2019-02-07 DIAGNOSIS — G620 Drug-induced polyneuropathy: Secondary | ICD-10-CM | POA: Insufficient documentation

## 2019-02-07 DIAGNOSIS — D6481 Anemia due to antineoplastic chemotherapy: Secondary | ICD-10-CM | POA: Insufficient documentation

## 2019-02-07 DIAGNOSIS — Z95828 Presence of other vascular implants and grafts: Secondary | ICD-10-CM

## 2019-02-07 DIAGNOSIS — Z5111 Encounter for antineoplastic chemotherapy: Secondary | ICD-10-CM | POA: Diagnosis present

## 2019-02-07 DIAGNOSIS — Z170 Estrogen receptor positive status [ER+]: Secondary | ICD-10-CM | POA: Diagnosis not present

## 2019-02-07 DIAGNOSIS — G629 Polyneuropathy, unspecified: Secondary | ICD-10-CM | POA: Insufficient documentation

## 2019-02-07 DIAGNOSIS — C50212 Malignant neoplasm of upper-inner quadrant of left female breast: Secondary | ICD-10-CM

## 2019-02-07 LAB — CBC WITH DIFFERENTIAL (CANCER CENTER ONLY)
Abs Immature Granulocytes: 0.13 10*3/uL — ABNORMAL HIGH (ref 0.00–0.07)
Basophils Absolute: 0.1 10*3/uL (ref 0.0–0.1)
Basophils Relative: 1 %
Eosinophils Absolute: 0.1 10*3/uL (ref 0.0–0.5)
Eosinophils Relative: 2 %
HCT: 34.9 % — ABNORMAL LOW (ref 36.0–46.0)
Hemoglobin: 11.4 g/dL — ABNORMAL LOW (ref 12.0–15.0)
Immature Granulocytes: 3 %
Lymphocytes Relative: 21 %
Lymphs Abs: 1.1 10*3/uL (ref 0.7–4.0)
MCH: 31.9 pg (ref 26.0–34.0)
MCHC: 32.7 g/dL (ref 30.0–36.0)
MCV: 97.8 fL (ref 80.0–100.0)
Monocytes Absolute: 0.6 10*3/uL (ref 0.1–1.0)
Monocytes Relative: 12 %
Neutro Abs: 3.1 10*3/uL (ref 1.7–7.7)
Neutrophils Relative %: 61 %
Platelet Count: 251 10*3/uL (ref 150–400)
RBC: 3.57 MIL/uL — ABNORMAL LOW (ref 3.87–5.11)
RDW: 14.5 % (ref 11.5–15.5)
WBC Count: 5 10*3/uL (ref 4.0–10.5)
nRBC: 0 % (ref 0.0–0.2)

## 2019-02-07 LAB — CMP (CANCER CENTER ONLY)
ALT: 17 U/L (ref 0–44)
AST: 12 U/L — ABNORMAL LOW (ref 15–41)
Albumin: 3.5 g/dL (ref 3.5–5.0)
Alkaline Phosphatase: 83 U/L (ref 38–126)
Anion gap: 7 (ref 5–15)
BUN: 11 mg/dL (ref 6–20)
CO2: 26 mmol/L (ref 22–32)
Calcium: 8.8 mg/dL — ABNORMAL LOW (ref 8.9–10.3)
Chloride: 107 mmol/L (ref 98–111)
Creatinine: 0.71 mg/dL (ref 0.44–1.00)
GFR, Est AFR Am: >60 mL/min (ref >60)
GFR, Estimated: >60 mL/min (ref >60)
Glucose, Bld: 95 mg/dL (ref 70–99)
Potassium: 4.3 mmol/L (ref 3.5–5.1)
Sodium: 140 mmol/L (ref 135–145)
Total Bilirubin: 0.4 mg/dL (ref 0.3–1.2)
Total Protein: 6.5 g/dL (ref 6.5–8.1)

## 2019-02-07 MED ORDER — DIPHENHYDRAMINE HCL 50 MG/ML IJ SOLN
25.0000 mg | Freq: Once | INTRAMUSCULAR | Status: AC
  Administered 2019-02-07: 25 mg via INTRAVENOUS

## 2019-02-07 MED ORDER — SODIUM CHLORIDE 0.9 % IV SOLN
50.0000 mg/m2 | Freq: Once | INTRAVENOUS | Status: AC
  Administered 2019-02-07: 120 mg via INTRAVENOUS
  Filled 2019-02-07: qty 20

## 2019-02-07 MED ORDER — SODIUM CHLORIDE 0.9 % IV SOLN
20.0000 mg | Freq: Once | INTRAVENOUS | Status: AC
  Administered 2019-02-07: 20 mg via INTRAVENOUS
  Filled 2019-02-07: qty 20

## 2019-02-07 MED ORDER — SODIUM CHLORIDE 0.9 % IV SOLN
Freq: Once | INTRAVENOUS | Status: AC
  Filled 2019-02-07: qty 250

## 2019-02-07 MED ORDER — SODIUM CHLORIDE 0.9% FLUSH
10.0000 mL | INTRAVENOUS | Status: DC | PRN
  Administered 2019-02-07: 10 mL
  Filled 2019-02-07: qty 10

## 2019-02-07 MED ORDER — FAMOTIDINE IN NACL 20-0.9 MG/50ML-% IV SOLN
20.0000 mg | Freq: Once | INTRAVENOUS | Status: AC
  Administered 2019-02-07: 20 mg via INTRAVENOUS

## 2019-02-07 MED ORDER — HEPARIN SOD (PORK) LOCK FLUSH 100 UNIT/ML IV SOLN
500.0000 [IU] | Freq: Once | INTRAVENOUS | Status: AC | PRN
  Administered 2019-02-07: 500 [IU]
  Filled 2019-02-07: qty 5

## 2019-02-07 MED ORDER — DIPHENHYDRAMINE HCL 50 MG/ML IJ SOLN
INTRAMUSCULAR | Status: AC
  Filled 2019-02-07: qty 1

## 2019-02-07 MED ORDER — FAMOTIDINE IN NACL 20-0.9 MG/50ML-% IV SOLN
INTRAVENOUS | Status: AC
  Filled 2019-02-07: qty 50

## 2019-02-07 NOTE — Patient Instructions (Signed)
[facility] Discharge Instructions for Patients Receiving Chemotherapy  Today you received the following chemotherapy agents Paclitaxel.   To help prevent nausea and vomiting after your treatment, we encourage you to take your nausea medication as prescribed.   If you develop nausea and vomiting that is not controlled by your nausea medication, call the clinic.   BELOW ARE SYMPTOMS THAT SHOULD BE REPORTED IMMEDIATELY:  *FEVER GREATER THAN 100.5 F  *CHILLS WITH OR WITHOUT FEVER  NAUSEA AND VOMITING THAT IS NOT CONTROLLED WITH YOUR NAUSEA MEDICATION  *UNUSUAL SHORTNESS OF BREATH  *UNUSUAL BRUISING OR BLEEDING  TENDERNESS IN MOUTH AND THROAT WITH OR WITHOUT PRESENCE OF ULCERS  *URINARY PROBLEMS  *BOWEL PROBLEMS  UNUSUAL RASH Items with * indicate a potential emergency and should be followed up as soon as possible.  Feel free to call the clinic should you have any questions or concerns. The clinic phone number is [phone].  Please show the CHEMO ALERT CARD at check-in to the Emergency Department and triage nurse. 

## 2019-02-14 ENCOUNTER — Telehealth: Payer: Self-pay | Admitting: *Deleted

## 2019-02-14 ENCOUNTER — Inpatient Hospital Stay: Payer: BC Managed Care – PPO

## 2019-02-14 ENCOUNTER — Encounter: Payer: Self-pay | Admitting: Adult Health

## 2019-02-14 ENCOUNTER — Inpatient Hospital Stay: Payer: BC Managed Care – PPO | Admitting: Adult Health

## 2019-02-14 ENCOUNTER — Other Ambulatory Visit: Payer: Self-pay

## 2019-02-14 DIAGNOSIS — C50212 Malignant neoplasm of upper-inner quadrant of left female breast: Secondary | ICD-10-CM

## 2019-02-14 DIAGNOSIS — Z5111 Encounter for antineoplastic chemotherapy: Secondary | ICD-10-CM | POA: Diagnosis not present

## 2019-02-14 DIAGNOSIS — Z170 Estrogen receptor positive status [ER+]: Secondary | ICD-10-CM

## 2019-02-14 LAB — CBC WITH DIFFERENTIAL (CANCER CENTER ONLY)
Abs Immature Granulocytes: 0.18 10*3/uL — ABNORMAL HIGH (ref 0.00–0.07)
Basophils Absolute: 0.1 10*3/uL (ref 0.0–0.1)
Basophils Relative: 1 %
Eosinophils Absolute: 0.1 10*3/uL (ref 0.0–0.5)
Eosinophils Relative: 2 %
HCT: 33.2 % — ABNORMAL LOW (ref 36.0–46.0)
Hemoglobin: 10.7 g/dL — ABNORMAL LOW (ref 12.0–15.0)
Immature Granulocytes: 3 %
Lymphocytes Relative: 19 %
Lymphs Abs: 1.1 10*3/uL (ref 0.7–4.0)
MCH: 31.7 pg (ref 26.0–34.0)
MCHC: 32.2 g/dL (ref 30.0–36.0)
MCV: 98.2 fL (ref 80.0–100.0)
Monocytes Absolute: 0.6 10*3/uL (ref 0.1–1.0)
Monocytes Relative: 11 %
Neutro Abs: 3.8 10*3/uL (ref 1.7–7.7)
Neutrophils Relative %: 64 %
Platelet Count: 245 10*3/uL (ref 150–400)
RBC: 3.38 MIL/uL — ABNORMAL LOW (ref 3.87–5.11)
RDW: 14.9 % (ref 11.5–15.5)
WBC Count: 5.8 10*3/uL (ref 4.0–10.5)
nRBC: 0 % (ref 0.0–0.2)

## 2019-02-14 LAB — CMP (CANCER CENTER ONLY)
ALT: 18 U/L (ref 0–44)
AST: 12 U/L — ABNORMAL LOW (ref 15–41)
Albumin: 3.3 g/dL — ABNORMAL LOW (ref 3.5–5.0)
Alkaline Phosphatase: 80 U/L (ref 38–126)
Anion gap: 9 (ref 5–15)
BUN: 15 mg/dL (ref 6–20)
CO2: 25 mmol/L (ref 22–32)
Calcium: 8.7 mg/dL — ABNORMAL LOW (ref 8.9–10.3)
Chloride: 109 mmol/L (ref 98–111)
Creatinine: 0.69 mg/dL (ref 0.44–1.00)
GFR, Est AFR Am: >60 mL/min (ref >60)
GFR, Estimated: >60 mL/min (ref >60)
Glucose, Bld: 73 mg/dL (ref 70–99)
Potassium: 4.2 mmol/L (ref 3.5–5.1)
Sodium: 143 mmol/L (ref 135–145)
Total Bilirubin: 0.4 mg/dL (ref 0.3–1.2)
Total Protein: 6.3 g/dL — ABNORMAL LOW (ref 6.5–8.1)

## 2019-02-14 MED ORDER — SODIUM CHLORIDE 0.9 % IV SOLN
20.0000 mg | Freq: Once | INTRAVENOUS | Status: AC
  Administered 2019-02-14: 20 mg via INTRAVENOUS
  Filled 2019-02-14: qty 20

## 2019-02-14 MED ORDER — SODIUM CHLORIDE 0.9 % IV SOLN
50.0000 mg/m2 | Freq: Once | INTRAVENOUS | Status: AC
  Administered 2019-02-14: 120 mg via INTRAVENOUS
  Filled 2019-02-14: qty 20

## 2019-02-14 MED ORDER — SODIUM CHLORIDE 0.9 % IV SOLN
Freq: Once | INTRAVENOUS | Status: AC
  Filled 2019-02-14: qty 250

## 2019-02-14 MED ORDER — DIPHENHYDRAMINE HCL 50 MG/ML IJ SOLN
INTRAMUSCULAR | Status: AC
  Filled 2019-02-14: qty 1

## 2019-02-14 MED ORDER — DIPHENHYDRAMINE HCL 50 MG/ML IJ SOLN
25.0000 mg | Freq: Once | INTRAMUSCULAR | Status: AC
  Administered 2019-02-14: 25 mg via INTRAVENOUS

## 2019-02-14 MED ORDER — SODIUM CHLORIDE 0.9% FLUSH
10.0000 mL | INTRAVENOUS | Status: DC | PRN
  Administered 2019-02-14: 10 mL
  Filled 2019-02-14: qty 10

## 2019-02-14 MED ORDER — FAMOTIDINE IN NACL 20-0.9 MG/50ML-% IV SOLN
20.0000 mg | Freq: Once | INTRAVENOUS | Status: AC
  Administered 2019-02-14: 20 mg via INTRAVENOUS

## 2019-02-14 MED ORDER — HEPARIN SOD (PORK) LOCK FLUSH 100 UNIT/ML IV SOLN
500.0000 [IU] | Freq: Once | INTRAVENOUS | Status: AC | PRN
  Administered 2019-02-14: 500 [IU]
  Filled 2019-02-14: qty 5

## 2019-02-14 MED ORDER — FAMOTIDINE IN NACL 20-0.9 MG/50ML-% IV SOLN
INTRAVENOUS | Status: AC
  Filled 2019-02-14: qty 50

## 2019-02-14 NOTE — Telephone Encounter (Signed)
Called pt to congratulate on completion of chemo as well as discuss xrt in [place]. Informed pt that she will remain with [facility] for med onc and did not need to acquire a med on in [place]. Received verbal understanding.

## 2019-02-14 NOTE — Patient Instructions (Signed)
Tamoxifen oral tablet What is this medicine? TAMOXIFEN (ta MOX i fen) blocks the effects of estrogen. It is commonly used to treat breast cancer. It is also used to decrease the chance of breast cancer coming back in women who have received treatment for the disease. It may also help prevent breast cancer in women who have a high risk of developing breast cancer. This medicine may be used for other purposes; ask your health care provider or pharmacist if you have questions. COMMON BRAND NAME(S): Nolvadex What should I tell my health care provider before I take this medicine? They need to know if you have any of these conditions:  blood clots  blood disease  cataracts or impaired eyesight  endometriosis  high calcium levels  high cholesterol  irregular menstrual cycles  liver disease  stroke  uterine fibroids  an unusual reaction to tamoxifen, other medicines, foods, dyes, or preservatives  pregnant or trying to get pregnant  breast-feeding How should I use this medicine? Take this medicine by mouth with a glass of water. Follow the directions on the prescription label. You can take it with or without food. Take your medicine at regular intervals. Do not take your medicine more often than directed. Do not stop taking except on your doctor's advice. A special MedGuide will be given to you by the pharmacist with each prescription and refill. Be sure to read this information carefully each time. Talk to your pediatrician regarding the use of this medicine in children. While this drug may be prescribed for selected conditions, precautions do apply. Overdosage: If you think you have taken too much of this medicine contact a poison control center or emergency room at once. NOTE: This medicine is only for you. Do not share this medicine with others. What if I miss a dose? If you miss a dose, take it as soon as you can. If it is almost time for your next dose, take only that dose. Do  not take double or extra doses. What may interact with this medicine? Do not take this medicine with any of the following medications:  cisapride  certain medicines for irregular heart beat like dronedarone, quinidine  certain medicines for fungal infection like fluconazole, posaconazole  pimozide  saquinavir  thioridazine This medicine may also interact with the following medications:  aminoglutethimide  anastrozole  bromocriptine  chemotherapy drugs  dofetilide  female hormones, like estrogens and birth control pills  letrozole  medroxyprogesterone  phenobarbital  rifampin  warfarin This list may not describe all possible interactions. Give your health care provider a list of all the medicines, herbs, non-prescription drugs, or dietary supplements you use. Also tell them if you smoke, drink alcohol, or use illegal drugs. Some items may interact with your medicine. What should I watch for while using this medicine? Visit your doctor or health care professional for regular checks on your progress. You will need regular pelvic exams, breast exams, and mammograms. If you are taking this medicine to reduce your risk of getting breast cancer, you should know that this medicine does not prevent all types of breast cancer. If breast cancer or other problems occur, there is no guarantee that it will be found at an early stage. Do not become pregnant while taking this medicine or for 2 months after stopping it. Women should inform their doctor if they wish to become pregnant or think they might be pregnant. There is a potential for serious side effects to an unborn child. Talk to your  health care professional or pharmacist for more information. Do not breast-feed an infant while taking this medicine or for 3 months after stopping it. This medicine may interfere with the ability to have a child. Talk with your doctor or health care professional if you are concerned about your  fertility. What side effects may I notice from receiving this medicine? Side effects that you should report to your doctor or health care professional as soon as possible:  allergic reactions like skin rash, itching or hives, swelling of the face, lips, or tongue  changes in vision  changes in your menstrual cycle  difficulty walking or talking  new breast lumps  numbness  pelvic pain or pressure  redness, blistering, peeling or loosening of the skin, including inside the mouth  signs and symptoms of a dangerous change in heartbeat or heart rhythm like chest pain, dizziness, fast or irregular heartbeat, palpitations, feeling faint or lightheaded, falls, breathing problems  sudden chest pain  swelling, pain or tenderness in your calf or leg  unusual bruising or bleeding  vaginal discharge that is bloody, brown, or rust  weakness  yellowing of the whites of the eyes or skin Side effects that usually do not require medical attention (report to your doctor or health care professional if they continue or are bothersome):  fatigue  hair loss, although uncommon and is usually mild  headache  hot flashes  impotence (in men)  nausea, vomiting (mild)  vaginal discharge (white or clear) This list may not describe all possible side effects. Call your doctor for medical advice about side effects. You may report side effects to FDA at 1-800-FDA-1088. Where should I keep my medicine? Keep out of the reach of children. Store at room temperature between 20 and 25 degrees C (68 and 77 degrees F). Protect from light. Keep container tightly closed. Throw away any unused medicine after the expiration date. NOTE: This sheet is a summary. It may not cover all possible information. If you have questions about this medicine, talk to your doctor, pharmacist, or health care provider.  2020 [employer]/Gold Standard (2017-12-15 11:15:31)    Anastrozole tablets What is this medicine?  ANASTROZOLE (an AS troe zole) is used to treat breast cancer in women who have gone through menopause. Some types of breast cancer depend on estrogen to grow, and this medicine can stop tumor growth by blocking estrogen production. This medicine may be used for other purposes; ask your health care provider or pharmacist if you have questions. COMMON BRAND NAME(S): Arimidex What should I tell my health care provider before I take this medicine? They need to know if you have any of these conditions:  bone problems  heart disease  high cholesterol  an unusual or allergic reaction to anastrozole, other medicines, foods, dyes, or preservatives  pregnant or trying to get pregnant  breast-feeding How should I use this medicine? Take this medicine by mouth with a glass of water. Follow the directions on the prescription label. You can take it with or without food. If it upsets your stomach, take it with food. Take your medicine at regular intervals. Do not take it more often than directed. Do not stop taking except on your doctor's advice. Talk to your pediatrician regarding the use of this medicine in children. Special care may be needed. Overdosage: If you think you have taken too much of this medicine contact a poison control center or emergency room at once. NOTE: This medicine is only for you. Do not   share this medicine with others. What if I miss a dose? If you miss a dose, take it as soon as you can. If it is almost time for your next dose, take only that dose. Do not take double or extra doses. What may interact with this medicine? This medicine may interact with the following medications:  female hormones, like estrogens or progestins and birth control pills, patches, rings, or injections  tamoxifen This list may not describe all possible interactions. Give your health care provider a list of all the medicines, herbs, non-prescription drugs, or dietary supplements you use. Also tell  them if you smoke, drink alcohol, or use illegal drugs. Some items may interact with your medicine. What should I watch for while using this medicine? Visit your doctor or health care professional for regular checks on your progress. Let your doctor or health care professional know about any unusual vaginal bleeding. Do not become pregnant while taking this medicine or for at least 3 weeks after stopping it. Women should inform their doctor if they wish to become pregnant or think they might be pregnant. There is a potential for serious side effects to an unborn child. Talk to your health care professional or pharmacist for more information. Do not breast-feed an infant while taking this medicine or for 2 weeks after stopping it. This medicine may interfere with the ability to have a child. Talk with your doctor or health care professional if you are concerned about your fertility. Using this medicine for a long time may increase your risk of low bone mass. Talk to your doctor about bone health. You should make sure that you get enough calcium and vitamin D while you are taking this medicine. Discuss the foods you eat and the vitamins you take with your health care professional. What side effects may I notice from receiving this medicine? Side effects that you should report to your doctor or health care professional as soon as possible:  allergic reactions like skin rash, itching or hives, swelling of the face, lips, or tongue  signs and symptoms of a blood clot such as breathing problems; changes in vision; chest pain; sudden headache; pain, swelling, warmth in the leg; trouble speaking; sudden numbness or weakness of the face, arm, or leg  signs and symptoms of infection like fever or chills; cough; sore throat; pain or trouble passing urine Side effects that usually do not require medical attention (report to your doctor or health care professional if they continue or are bothersome):  bone pain   dizziness  hair loss  headache  hot flashes  joint pain  muscle pain  signs of decreased red blood cells - unusually weak or tired, feeling faint or lightheaded, falls  vaginal discharge, itching, or odor in women This list may not describe all possible side effects. Call your doctor for medical advice about side effects. You may report side effects to FDA at 1-800-FDA-1088. Where should I keep my medicine? Keep out of the reach of children. Store at room temperature between 20 and 25 degrees C (68 and 77 degrees F). Throw away any unused medicine after the expiration date. NOTE: This sheet is a summary. It may not cover all possible information. If you have questions about this medicine, talk to your doctor, pharmacist, or health care provider.  2020 [employer]/Gold Standard (2017-01-05 14:56:51)  

## 2019-02-14 NOTE — Patient Instructions (Signed)
[facility] Discharge Instructions for Patients Receiving Chemotherapy  Today you received the following chemotherapy agents: Paclitaxel (Taxol)  To help prevent nausea and vomiting after your treatment, we encourage you to take your nausea medication as prescribed. If you develop nausea and vomiting that is not controlled by your nausea medication, call the clinic.   BELOW ARE SYMPTOMS THAT SHOULD BE REPORTED IMMEDIATELY:  *FEVER GREATER THAN 100.5 F  *CHILLS WITH OR WITHOUT FEVER  NAUSEA AND VOMITING THAT IS NOT CONTROLLED WITH YOUR NAUSEA MEDICATION  *UNUSUAL SHORTNESS OF BREATH  *UNUSUAL BRUISING OR BLEEDING  TENDERNESS IN MOUTH AND THROAT WITH OR WITHOUT PRESENCE OF ULCERS  *URINARY PROBLEMS  *BOWEL PROBLEMS  UNUSUAL RASH Items with * indicate a potential emergency and should be followed up as soon as possible.  Feel free to call the clinic should you have any questions or concerns. The clinic phone number is [phone].  Please show the CHEMO ALERT CARD at check-in to the Emergency Department and triage nurse.   

## 2019-02-14 NOTE — Progress Notes (Signed)
[facility] Follow up:    Patient, [name] No address on file   DIAGNOSIS: Cancer Staging Malignant neoplasm of upper-inner quadrant of left breast in female, estrogen receptor positive ([place]) Staging form: Breast, AJCC 8th Edition - Clinical stage from 07/07/2018: Stage IA (cT1c, cN0, cM0, G2, ER+, PR+, HER2-) - Signed by [name], MD on 07/07/2018   SUMMARY OF ONCOLOGIC HISTORY: Oncology History  Malignant neoplasm of upper-inner quadrant of left breast in female, estrogen receptor positive ([place])  07/05/2018 Initial Diagnosis   Screening mammogram detected bilateral breast masses. Diagnostic mammogram and US showed cysts in the right breast and a 1.2cm left breast mass at the 9 o'clock position. 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    07/07/2018 Cancer Staging   Staging form: Breast, AJCC 8th Edition - Clinical stage from 07/07/2018: Stage IA (cT1c, cN0, cM0, G2, ER+, PR+, HER2-) - Signed by [name], MD on 07/07/2018    Genetic Testing   Negative testing. No pathogenic variants identified on the [facility] Common Hereditary Cancers Panel. The Common Hereditary Cancers Panel offered by [facility] includes sequencing and/or deletion duplication testing of the following 47 genes: APC, ATM, AXIN2, BARD1, BMPR1A, BRCA1, BRCA2, BRIP1, CDH1, CDKN2A (p14ARF), CDKN2A (p16INK4a), CKD4, CHEK2, CTNNA1, DICER1, EPCAM (Deletion/duplication testing only), GREM1 (promoter region deletion/duplication testing only), KIT, MEN1, MLH1, MSH2, MSH3, MSH6, MUTYH, NBN, NF1, NHTL1, PALB2, PDGFRA, PMS2, POLD1, POLE, PTEN, RAD50, RAD51C, RAD51D, SDHB, SDHC, SDHD, SMAD4, SMARCA4. STK11, TP53, TSC1, TSC2, and VHL.  The following genes were evaluated for sequence changes only: SDHA and HOXB13 c.251G>A variant only. The report date is 07/18/2018.   08/04/2018 Surgery   Left lumpectomy Caitlin Graham): IDC with intermediate grade DCIS, 1.2cm, grade 1, clear margins, with  3 axillary lymph nodes negative for carcinoma ER 100%, PR 60%, Ki-67 50%   09/07/2018 -  Chemotherapy   The patient had DOXOrubicin (ADRIAMYCIN) chemo injection 146 mg, 60 mg/m2 = 146 mg, Intravenous,  Once, 4 of 4 cycles Administration: 146 mg (09/07/2018), 146 mg (09/20/2018), 146 mg (10/04/2018), 146 mg (10/18/2018) palonosetron (ALOXI) injection 0.25 mg, 0.25 mg, Intravenous,  Once, 4 of 4 cycles Administration: 0.25 mg (09/07/2018), 0.25 mg (09/20/2018), 0.25 mg (10/04/2018), 0.25 mg (10/18/2018) pegfilgrastim-jmdb (FULPHILA) injection 6 mg, 6 mg, Subcutaneous,  Once, 4 of 4 cycles Administration: 6 mg (09/09/2018), 6 mg (09/22/2018), 6 mg (10/06/2018), 6 mg (10/20/2018) cyclophosphamide (CYTOXAN) 1,460 mg in sodium chloride 0.9 % 250 mL chemo infusion, 600 mg/m2 = 1,460 mg, Intravenous,  Once, 4 of 4 cycles Administration: 1,460 mg (09/07/2018), 1,460 mg (09/20/2018), 1,460 mg (10/04/2018), 1,460 mg (10/18/2018) PACLitaxel (TAXOL) 198 mg in sodium chloride 0.9 % 250 mL chemo infusion (</= '80mg'$ /m2), 80 mg/m2 = 198 mg, Intravenous,  Once, 12 of 12 cycles Dose modification: 65 mg/m2 (original dose 80 mg/m2, Cycle 9, Reason: Provider Judgment, Comment: leukopenia), 50 mg/m2 (original dose 80 mg/m2, Cycle 14, Reason: Dose not tolerated) Administration: 198 mg (11/01/2018), 198 mg (12/06/2018), 198 mg (12/13/2018), 156 mg (12/20/2018), 156 mg (12/27/2018), 156 mg (01/03/2019), 156 mg (01/10/2019), 156 mg (01/17/2019), 156 mg (01/24/2019), 120 mg (01/31/2019), 120 mg (02/07/2019) fosaprepitant (EMEND) 150 mg, dexamethasone (DECADRON) 12 mg in sodium chloride 0.9 % 145 mL IVPB, , Intravenous,  Once, 4 of 4 cycles Administration:  (09/07/2018),  (09/20/2018),  (10/04/2018),  (10/18/2018)  for chemotherapy treatment.      CURRENT THERAPY: Taxol  INTERVAL HISTORY: Caitlin Graham 54 y.o. female returns for evaluation  prior to receiving her final weekly paclitaxel.  She is planning on going to receive radiation therapy in [place].  She  is concerned about hearing from her friend who underwent radiation consultation in [place] three years earlier that she was recommended to change oncologists.  Caitlin Graham is very worried about this and concerned that she may lose Dr. Lindi Adie as her oncologist.  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  She notes this is constant, and worse with certain postions.  The neuroapthy is described as a tingling, and she still has feeling in her toes, it is very mildly worse than it was two weeks ago.  She is practicing cryotherapy and her dose was also reduced at her last appointment with Dr. Lindi Adie 2 weeks ago.  She also notes that she has patchy hair regrowth on her scalp which is concerning to her.     Patient Active Problem List   Diagnosis Date Noted  . Port-A-Cath in place 10/18/2018  . Genetic testing 07/19/2018  . Family history of ovarian cancer   . Family history of stomach cancer   . Malignant neoplasm of upper-inner quadrant of left breast in female, estrogen receptor positive ([place]) 07/05/2018    is allergic to aspirin; ibuprofen; hydrocodone; oxycodone; and phenergan [promethazine].  MEDICAL HISTORY: Past Medical History:  Diagnosis Date  . BMI 45.0-49.9, adult ([place])   . Depression   . Family history of ovarian cancer   . Family history of stomach cancer   . Sleep apnea    resolved with gastic bypass    SURGICAL HISTORY: Past Surgical History:  Procedure Laterality Date  . BREAST LUMPECTOMY WITH RADIOACTIVE SEED AND SENTINEL LYMPH NODE BIOPSY Left 08/04/2018   Procedure: LEFT BREAST LUMPECTOMY WITH RADIOACTIVE SEED AND SENTINEL LYMPH NODE BIOPSY;  Surgeon: Caitlin Kussmaul, MD;  Location: [facility];  Service: General;  Laterality: Left;  . gastric bypass    . left ulnar     . PORTACATH PLACEMENT Right 09/06/2018   Procedure: INSERTION PORT-A-CATHETER;  Surgeon: Caitlin Kussmaul, MD;  Location: [facility];  Service: General;  Laterality: Right;     SOCIAL HISTORY: Social History   Socioeconomic History  . Marital status: Married    Spouse name: Not on file  . Number of children: Not on file  . Years of education: Not on file  . Highest education level: Not on file  Occupational History  . Not on file  Tobacco Use  . Smoking status: Never Smoker  . Smokeless tobacco: Never Used  Substance and Sexual Activity  . Alcohol use: Yes    Comment: 1-2  . Drug use: No  . Sexual activity: Yes    Birth control/protection: Condom  Other Topics Concern  . Not on file  Social History Narrative   ** Merged History Encounter **       Social Determinants of Health   Financial Resource Strain:   . Difficulty of Paying Living Expenses: Not on file  Food Insecurity:   . Worried About Charity fundraiser in the Last Year: Not on file  . Ran Out of Food in the Last Year: Not on file  Transportation Needs:   . Lack of Transportation (Medical): Not on file  . Lack of Transportation (Non-Medical): Not on file  Physical Activity:   . Days of Exercise per Week: Not on file  . Minutes of Exercise per Session: Not on file  Stress:   .  Feeling of Stress : Not on file  Social Connections:   . Frequency of Communication with Friends and Family: Not on file  . Frequency of Social Gatherings with Friends and Family: Not on file  . Attends Religious Services: Not on file  . Active Member of Clubs or Organizations: Not on file  . Attends Archivist Meetings: Not on file  . Marital Status: Not on file  Intimate Partner Violence:   . Fear of Current or Ex-Partner: Not on file  . Emotionally Abused: Not on file  . Physically Abused: Not on file  . Sexually Abused: Not on file    FAMILY HISTORY: Family History  Problem Relation Age of Onset  . Bone cancer Paternal Grandfather   . Ovarian cancer Paternal Aunt   . Stomach cancer Cousin        dx young    Review of Systems  Constitutional: Negative for appetite change,  chills, fatigue, fever and unexpected weight change.  HENT:   Negative for hearing loss, lump/mass and trouble swallowing.   Eyes: Negative for eye problems and icterus.  Respiratory: Negative for chest tightness, cough and shortness of breath.   Cardiovascular: Negative for chest pain, leg swelling and palpitations.  Gastrointestinal: Negative for abdominal distention, abdominal pain, constipation, diarrhea, nausea and vomiting.  Endocrine: Negative for hot flashes.  Genitourinary: Negative for difficulty urinating.   Musculoskeletal: Negative for arthralgias.  Skin: Negative for itching and rash.  Neurological: Positive for numbness. Negative for dizziness, extremity weakness and headaches.  Hematological: Negative for adenopathy. Does not bruise/bleed easily.  Psychiatric/Behavioral: Negative for depression. The patient is not nervous/anxious.       PHYSICAL EXAMINATION  ECOG PERFORMANCE STATUS: 1  Vitals:   02/14/19 0912  BP: 101/67  Pulse: 79  Resp: 17  Temp: 98 F (36.7 C)  SpO2: 99%    Physical Exam Constitutional:      General: She is not in acute distress.    Appearance: Normal appearance. She is not toxic-appearing.  HENT:     Head: Normocephalic and atraumatic.  Eyes:     General: No scleral icterus. Cardiovascular:     Rate and Rhythm: Normal rate and regular rhythm.     Pulses: Normal pulses.     Heart sounds: Normal heart sounds.  Pulmonary:     Effort: Pulmonary effort is normal.     Breath sounds: Normal breath sounds.  Skin:    General: Skin is warm and dry.     Findings: No rash.  Neurological:     General: No focal deficit present.     Mental Status: She is alert.     LABORATORY DATA:  CBC    Component Value Date/Time   WBC 5.8 02/14/2019 0905   WBC 15.8 (H) 02/28/2007 0527   RBC 3.38 (L) 02/14/2019 0905   HGB 10.7 (L) 02/14/2019 0905   HCT 33.2 (L) 02/14/2019 0905   PLT 245 02/14/2019 0905   MCV 98.2 02/14/2019 0905   MCH 31.7  02/14/2019 0905   MCHC 32.2 02/14/2019 0905   RDW 14.9 02/14/2019 0905   LYMPHSABS 1.1 02/14/2019 0905   MONOABS 0.6 02/14/2019 0905   EOSABS 0.1 02/14/2019 0905   BASOSABS 0.1 02/14/2019 0905    CMP     Component Value Date/Time   NA 143 02/14/2019 0905   K 4.2 02/14/2019 0905   CL 109 02/14/2019 0905   CO2 25 02/14/2019 0905   GLUCOSE 73 02/14/2019 0905   BUN  15 02/14/2019 0905   CREATININE 0.69 02/14/2019 0905   CALCIUM 8.7 (L) 02/14/2019 0905   PROT 6.3 (L) 02/14/2019 0905   ALBUMIN 3.3 (L) 02/14/2019 0905   AST 12 (L) 02/14/2019 0905   ALT 18 02/14/2019 0905   ALKPHOS 80 02/14/2019 0905   BILITOT 0.4 02/14/2019 0905   GFRNONAA >60 02/14/2019 0905   GFRAA >60 02/14/2019 0905      ASSESSMENT and PLAN:   Malignant neoplasm of upper-inner quadrant of left breast in female, estrogen receptor positive ([place]) 07/05/2018:Screening mammogram detected bilateral breast masses. Diagnostic mammogram and US showed cysts in the right breast and a 1.2cm left breast mass at the 9 o'clock position. 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 Stage Ia Brief course of neoadjuvant tamoxifen therapy started 07/07/2018 08/04/2018: Left lumpectomy:Left lumpectomy Caitlin Graham): IDC with intermediate grade DCIS, 1.2cm, grade 1, clear margins, with 3 axillary lymph nodes negative for carcinoma ER 100%, PR 60%, Ki-67 50%  Oncotype DX score: 32, distant recurrence at 9 years with hormone therapy alone: 20%  Treatment plan: 1.Adjuvant chemotherapy with dose dense Adriamycin and Cytoxan x4 followed by Taxol x12 2.radiation therapy followed by 3.Adjuvant antiestrogen therapy ----------------------------------------------------------------------------------------------------------------------------- Current treatment:Completed 4 cycles ofdose dense Adriamycin Cytoxan, today cycle10Taxol  Chemo toxicities: 1.  Chemotherapy-induced anemia: Hemoglobin is  stable 2.  Severe fatigue: Waxes and wanes 3.  Symptoms of depression: On Effexor 37.5 mg daily 4.  Chemo-induced peripheral neuropathy: minimally worse, will receive last treatment at dose reduction 5.  Leukopenia: Reduce the dosage of Taxol. Stable 6. Chemotherapy induced alopecia: reviewed that after the taxol the hair will start to regrow  Patient will now go onto receive adjuvant radiation therapy.  I suggested that she call the facility and review her concerns with them.  I assured her that she does not have to lose her oncologist.  I also asked one of our breast navigators to call her and discuss with her further.    Caitlin Graham and I discussed her peripheral neuropathy in detail.  We reviewed the very slight less than 0.5% point decrease in benefit from skipping her last dose of Taxol.  We reviewed that her neuropathy may worsen and may be permanent.  After detailed discussion, Sreshta is motivated to receive her final dose and will do so at her current dose reduction.    Aslynn will return in 8 weeks for f/u with Dr. Lindi Adie to resume anti estrogen therapy.      All questions were answered. The patient knows to call the clinic with any problems, questions or concerns. We can certainly see the patient much sooner if necessary.  Total time this encounter: 20 minutes This note was electronically signed. Scot Dock, NP 02/14/2019

## 2019-02-14 NOTE — Assessment & Plan Note (Addendum)
07/05/2018:Screening mammogram detected bilateral breast masses. Diagnostic mammogram and US showed cysts in the right breast and a 1.2cm left breast mass at the 9 o'clock position. 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 Stage Ia Brief course of neoadjuvant tamoxifen therapy started 07/07/2018 08/04/2018: Left lumpectomy:Left lumpectomy Caitlin Graham): IDC with intermediate grade DCIS, 1.2cm, grade 1, clear margins, with 3 axillary lymph nodes negative for carcinoma ER 100%, PR 60%, Ki-67 50%  Oncotype DX score: 32, distant recurrence at 9 years with hormone therapy alone: 20%  Treatment plan: 1.Adjuvant chemotherapy with dose dense Adriamycin and Cytoxan x4 followed by Taxol x12 2.radiation therapy followed by 3.Adjuvant antiestrogen therapy ----------------------------------------------------------------------------------------------------------------------------- Current treatment:Completed 4 cycles ofdose dense Adriamycin Cytoxan, today cycle10Taxol  Chemo toxicities: 1.  Chemotherapy-induced anemia: Hemoglobin is stable 2.  Severe fatigue: Waxes and wanes 3.  Symptoms of depression: On Effexor 37.5 mg daily 4.  Chemo-induced peripheral neuropathy: minimally worse, will receive last treatment at dose reduction 5.  Leukopenia: Reduce the dosage of Taxol. Stable 6. Chemotherapy induced alopecia: reviewed that after the taxol the hair will start to regrow  Patient will now go onto receive adjuvant radiation therapy.  I suggested that she call the facility and review her concerns with them.  I assured her that she does not have to lose her oncologist.  I also asked one of our breast navigators to call her and discuss with her further.    Caitlin Graham and I discussed her peripheral neuropathy in detail.  We reviewed the very slight less than 0.5% point decrease in benefit from skipping her last dose of Taxol.  We reviewed that her neuropathy may worsen and may  be permanent.  After detailed discussion, Caitlin Graham is motivated to receive her final dose and will do so at her current dose reduction.    Caitlin Graham will return in 8 weeks for f/u with Dr. Lindi Adie to resume anti estrogen therapy.

## 2019-03-02 ENCOUNTER — Ambulatory Visit: Payer: Self-pay | Admitting: General Surgery

## 2019-03-13 ENCOUNTER — Ambulatory Visit: Payer: BC Managed Care – PPO | Attending: Internal Medicine

## 2019-03-13 DIAGNOSIS — Z23 Encounter for immunization: Secondary | ICD-10-CM

## 2019-03-13 NOTE — Progress Notes (Signed)
   Covid-19 Vaccination Clinic  Name:  Reegan Coffee    MRN: [id number] DOB: 1965-12-23  03/13/2019  Ms. Lafalce was observed post Covid-19 immunization for 15 minutes without incident. She was provided with Vaccine Information Sheet and instruction to access the V-Safe system.   Ms. Polcyn was instructed to call 911 with any severe reactions post vaccine: Marland Kitchen Difficulty breathing  . Swelling of face and throat  . A fast heartbeat  . A bad rash all over body  . Dizziness and weakness   Immunizations Administered    Name Date Dose VIS Date Route   Pfizer COVID-19 Vaccine 03/13/2019 12:49 PM 0.3 mL 12/17/2018 Intramuscular   Manufacturer: [place]   Lot: [id number]   [place]: [id number]

## 2019-03-30 ENCOUNTER — Other Ambulatory Visit: Payer: Self-pay

## 2019-03-30 MED ORDER — TRAMADOL HCL 50 MG PO TABS: 50.0000 mg | ORAL_TABLET | Freq: Four times a day (QID) | ORAL | 0 refills | Status: DC | PRN

## 2019-04-03 ENCOUNTER — Ambulatory Visit: Payer: BC Managed Care – PPO | Attending: Internal Medicine

## 2019-04-03 DIAGNOSIS — Z23 Encounter for immunization: Secondary | ICD-10-CM

## 2019-04-03 NOTE — Progress Notes (Signed)
   Covid-19 Vaccination Clinic  Name:  Caitlin Graham    MRN: [id number] DOB: 1965-07-30  04/03/2019  [name] was observed post Covid-19 immunization for 15 minutes without incident. She was provided with Vaccine Information Sheet and instruction to access the V-Safe system.   [name] was instructed to call 911 with any severe reactions post vaccine: Marland Kitchen Difficulty breathing  . Swelling of face and throat  . A fast heartbeat  . A bad rash all over body  . Dizziness and weakness   Immunizations Administered    Name Date Dose VIS Date Route   Pfizer COVID-19 Vaccine 04/03/2019 11:35 AM 0.3 mL 12/17/2018 Intramuscular   Manufacturer: [place]   Lot: [id number]   [place]: [id number]

## 2019-04-10 NOTE — Progress Notes (Signed)
Patient Care Team: Patient, [name] as PCP - General (General Practice) Caitlin Kaufmann, RN as Oncology Nurse Navigator Caitlin Germany, RN as Oncology Nurse Navigator Caitlin Lose, MD as Consulting Physician (Hematology and Oncology) Caitlin Kussmaul, MD as Consulting Physician (General Surgery) Caitlin Gibson, MD as Attending Physician (Radiation Oncology)  DIAGNOSIS:    ICD-10-CM   1. Malignant neoplasm of upper-inner quadrant of left breast in female, estrogen receptor positive ([place])  C50.212    Z17.0     SUMMARY OF ONCOLOGIC HISTORY: Oncology History  Malignant neoplasm of upper-inner quadrant of left breast in female, estrogen receptor positive ([place])  07/05/2018 Initial Diagnosis   Screening mammogram detected bilateral breast masses. Diagnostic mammogram and US showed cysts in the right breast and a 1.2cm left breast mass at the 9 o'clock position. 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    07/07/2018 Cancer Staging   Staging form: Breast, AJCC 8th Edition - Clinical stage from 07/07/2018: Stage IA (cT1c, cN0, cM0, G2, ER+, PR+, HER2-) - Signed by Caitlin Lose, MD on 07/07/2018    Genetic Testing   Negative testing. No pathogenic variants identified on the [facility] Common Hereditary Cancers Panel. The Common Hereditary Cancers Panel offered by [facility] includes sequencing and/or deletion duplication testing of the following 47 genes: APC, ATM, AXIN2, BARD1, BMPR1A, BRCA1, BRCA2, BRIP1, CDH1, CDKN2A (p14ARF), CDKN2A (p16INK4a), CKD4, CHEK2, CTNNA1, DICER1, EPCAM (Deletion/duplication testing only), GREM1 (promoter region deletion/duplication testing only), KIT, MEN1, MLH1, MSH2, MSH3, MSH6, MUTYH, NBN, NF1, NHTL1, PALB2, PDGFRA, PMS2, POLD1, POLE, PTEN, RAD50, RAD51C, RAD51D, SDHB, SDHC, SDHD, SMAD4, SMARCA4. STK11, TP53, TSC1, TSC2, and VHL.  The following genes were evaluated for sequence changes only: SDHA and HOXB13 c.251G>A variant only. The  report date is 07/18/2018.   08/04/2018 Surgery   Left lumpectomy Caitlin Graham): IDC with intermediate grade DCIS, 1.2cm, grade 1, clear margins, with 3 axillary lymph nodes negative for carcinoma ER 100%, PR 60%, Ki-67 50%   09/07/2018 - 02/14/2019 Chemotherapy   The patient had DOXOrubicin (ADRIAMYCIN) chemo injection 146 mg, 60 mg/m2 = 146 mg, Intravenous,  Once, 4 of 4 cycles Administration: 146 mg (09/07/2018), 146 mg (09/20/2018), 146 mg (10/04/2018), 146 mg (10/18/2018) palonosetron (ALOXI) injection 0.25 mg, 0.25 mg, Intravenous,  Once, 4 of 4 cycles Administration: 0.25 mg (09/07/2018), 0.25 mg (09/20/2018), 0.25 mg (10/04/2018), 0.25 mg (10/18/2018) pegfilgrastim-jmdb (FULPHILA) injection 6 mg, 6 mg, Subcutaneous,  Once, 4 of 4 cycles Administration: 6 mg (09/09/2018), 6 mg (09/22/2018), 6 mg (10/06/2018), 6 mg (10/20/2018) cyclophosphamide (CYTOXAN) 1,460 mg in sodium chloride 0.9 % 250 mL chemo infusion, 600 mg/m2 = 1,460 mg, Intravenous,  Once, 4 of 4 cycles Administration: 1,460 mg (09/07/2018), 1,460 mg (09/20/2018), 1,460 mg (10/04/2018), 1,460 mg (10/18/2018) PACLitaxel (TAXOL) 198 mg in sodium chloride 0.9 % 250 mL chemo infusion (</= '80mg'$ /m2), 80 mg/m2 = 198 mg, Intravenous,  Once, 12 of 12 cycles Dose modification: 65 mg/m2 (original dose 80 mg/m2, Cycle 9, Reason: Provider Judgment, Comment: leukopenia), 50 mg/m2 (original dose 80 mg/m2, Cycle 14, Reason: Dose not tolerated) Administration: 198 mg (11/01/2018), 198 mg (12/06/2018), 198 mg (12/13/2018), 156 mg (12/20/2018), 156 mg (12/27/2018), 156 mg (01/03/2019), 156 mg (01/10/2019), 156 mg (01/17/2019), 156 mg (01/24/2019), 120 mg (01/31/2019), 120 mg (02/07/2019), 120 mg (02/14/2019) fosaprepitant (EMEND) 150 mg, dexamethasone (DECADRON) 12 mg in sodium chloride 0.9 % 145 mL IVPB, , Intravenous,  Once, 4 of 4 cycles Administration:  (09/07/2018),  (09/20/2018),  (10/04/2018),  (  10/18/2018)  for chemotherapy treatment.     03/2019 -  Radiation Therapy   Radiation  at [facility]     CHIEF COMPLIANT: Follow-up to discuss antiestrogen therapy  INTERVAL HISTORY: Caitlin Graham is a 54 y.o. with above-mentioned history of left breast cancer who underwent a lumpectomy,adjuvant chemotherapy treatment, and radiation at [facility]. She presents to the clinic today to discuss antiestrogen treatment.   ALLERGIES:  is allergic to aspirin; ibuprofen; hydrocodone; oxycodone; and phenergan [promethazine].  MEDICATIONS:  Current Outpatient Medications  Medication Sig Dispense Refill  . acetaminophen (TYLENOL) 325 MG tablet Take 650 mg by mouth every 6 (six) hours as needed.    . clindamycin (CLINDAGEL) 1 % gel Apply topically 2 (two) times daily. 30 g 1  . lidocaine-prilocaine (EMLA) cream Apply to affected area once 30 g 3  . LORazepam (ATIVAN) 0.5 MG tablet Take 1 tablet (0.5 mg total) by mouth at bedtime as needed for sleep. 30 tablet 2  . ondansetron (ZOFRAN) 8 MG tablet Take 1 tablet (8 mg total) by mouth 2 (two) times daily as needed. Start on the third day after chemotherapy. (Patient not taking: Reported on 02/14/2019) 30 tablet 1  . prochlorperazine (COMPAZINE) 10 MG tablet Take 1 tablet (10 mg total) by mouth every 6 (six) hours as needed (Nausea or vomiting). (Patient not taking: Reported on 02/14/2019) 30 tablet 1  . traMADol (ULTRAM) 50 MG tablet Take 1 tablet (50 mg total) by mouth every 6 (six) hours as needed. 60 tablet 0  . venlafaxine XR (EFFEXOR XR) 37.5 MG 24 hr capsule Take 1 capsule (37.5 mg total) by mouth daily with breakfast. (Patient not taking: Reported on 02/14/2019) 30 capsule 3   No current facility-administered medications for this visit.    PHYSICAL EXAMINATION: ECOG PERFORMANCE STATUS: 1 - Symptomatic but completely ambulatory  There were no vitals filed for this visit. There were no vitals filed for this visit.  LABORATORY DATA:  I have reviewed the data as listed CMP Latest Ref Rng & Units 02/14/2019 02/07/2019 01/31/2019  Glucose 70 - 99 mg/dL 73  95 98  BUN 6 - 20 mg/dL '15 11 13  '$ Creatinine 0.44 - 1.00 mg/dL 0.69 0.71 0.77  Sodium 135 - 145 mmol/L 143 140 139  Potassium 3.5 - 5.1 mmol/L 4.2 4.3 4.3  Chloride 98 - 111 mmol/L 109 107 105  CO2 22 - 32 mmol/L '25 26 26  '$ Calcium 8.9 - 10.3 mg/dL 8.7(L) 8.8(L) 9.3  Total Protein 6.5 - 8.1 g/dL 6.3(L) 6.5 6.5  Total Bilirubin 0.3 - 1.2 mg/dL 0.4 0.4 0.4  Alkaline Phos 38 - 126 U/L 80 83 88  AST 15 - 41 U/L 12(L) 12(L) 13(L)  ALT 0 - 44 U/L '18 17 17    '$ Lab Results  Component Value Date   WBC 5.8 02/14/2019   HGB 10.7 (L) 02/14/2019   HCT 33.2 (L) 02/14/2019   MCV 98.2 02/14/2019   PLT 245 02/14/2019   NEUTROABS 3.8 02/14/2019    ASSESSMENT & PLAN:  Malignant neoplasm of upper-inner quadrant of left breast in female, estrogen receptor positive ([place]) 07/05/2018:Screening mammogram detected bilateral breast masses. Diagnostic mammogram and US showed cysts in the right breast and a 1.2cm left breast mass at the 9 o'clock position. 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 Stage Ia Brief course of neoadjuvant tamoxifen therapy started 07/07/2018 08/04/2018: Left lumpectomy:Left lumpectomy Caitlin Graham): IDC with intermediate grade DCIS, 1.2cm, grade 1, clear margins, with  3 axillary lymph nodes negative for carcinoma ER 100%, PR 60%, Ki-67 50%  Oncotype DX score: 32, distant recurrence at 9 years with hormone therapy alone: 20%  Treatment plan: 1.Adjuvant chemotherapy with dose dense Adriamycin and Cytoxan x4 followed by Taxol x12 completed 02/14/2019 2.radiation therapy at [facility] started March 2021 3.Adjuvant antiestrogen therapy with tamoxifen since she has not had menstrual cycle since chemotherapy ----------------------------------------------------------------------------------------------------------------------------- Having completed adjuvant radiation, recommended starting antiestrogen therapy. She took tamoxifen prior to starting her chemo  and therefore she will resume it.  I sent a new prescription today.  She had tolerated it very well previously.  I once again went over the risks and benefits of tamoxifen.  Patchy hair growth: Patient appears to be very concerned about it.  I encouraged her to use coconut oil/castor oil etc. to help her growth. Nail issue: Discoloration of her great toenail  Return to clinic in 4 months for survivorship care plan visit.  No orders of the defined types were placed in this encounter.  The patient has a good understanding of the overall plan. she agrees with it. she will call with any problems that may develop before the next visit here.  Total time spent: 30 mins including face to face time and time spent for planning, charting and coordination of care  Caitlin Lose, MD 04/11/2019  I, Cloyde Reams Dorshimer, am acting as scribe for Dr. Nicholas Graham.  I have reviewed the above documentation for accuracy and completeness, and I agree with the above.

## 2019-04-11 ENCOUNTER — Encounter: Payer: Self-pay | Admitting: *Deleted

## 2019-04-11 ENCOUNTER — Inpatient Hospital Stay: Payer: BC Managed Care – PPO | Attending: Hematology and Oncology | Admitting: Hematology and Oncology

## 2019-04-11 ENCOUNTER — Other Ambulatory Visit: Payer: Self-pay

## 2019-04-11 DIAGNOSIS — Z170 Estrogen receptor positive status [ER+]: Secondary | ICD-10-CM | POA: Diagnosis not present

## 2019-04-11 DIAGNOSIS — C50212 Malignant neoplasm of upper-inner quadrant of left female breast: Secondary | ICD-10-CM | POA: Insufficient documentation

## 2019-04-11 DIAGNOSIS — Z5111 Encounter for antineoplastic chemotherapy: Secondary | ICD-10-CM | POA: Diagnosis present

## 2019-04-11 MED ORDER — TAMOXIFEN CITRATE 20 MG PO TABS: 20.0000 mg | ORAL_TABLET | Freq: Every day | ORAL | 3 refills | Status: DC

## 2019-04-11 NOTE — Assessment & Plan Note (Signed)
07/05/2018:Screening mammogram detected bilateral breast masses. Diagnostic mammogram and US showed cysts in the right breast and a 1.2cm left breast mass at the 9 o'clock position. 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 Stage Ia Brief course of neoadjuvant tamoxifen therapy started 07/07/2018 08/04/2018: Left lumpectomy:Left lumpectomy [name]): IDC with intermediate grade DCIS, 1.2cm, grade 1, clear margins, with 3 axillary lymph nodes negative for carcinoma ER 100%, PR 60%, Ki-67 50%  Oncotype DX score: 32, distant recurrence at 9 years with hormone therapy alone: 20%  Treatment plan: 1.Adjuvant chemotherapy with dose dense Adriamycin and Cytoxan x4 followed by Taxol x12 completed 02/14/2019 2.radiation therapy at [facility] started March 2021 3.Adjuvant antiestrogen therapy ----------------------------------------------------------------------------------------------------------------------------- Having completed adjuvant radiation, recommended starting antiestrogen therapy.

## 2019-04-14 ENCOUNTER — Telehealth: Payer: Self-pay | Admitting: Adult Health

## 2019-04-14 NOTE — Telephone Encounter (Signed)
Scheduled per 04/05 los, patient has been called and notified. ?

## 2019-04-28 ENCOUNTER — Other Ambulatory Visit (HOSPITAL_COMMUNITY): Payer: BC Managed Care – PPO

## 2019-04-29 ENCOUNTER — Encounter (HOSPITAL_BASED_OUTPATIENT_CLINIC_OR_DEPARTMENT_OTHER): Payer: Self-pay | Admitting: General Surgery

## 2019-04-29 ENCOUNTER — Other Ambulatory Visit: Payer: Self-pay

## 2019-05-05 ENCOUNTER — Other Ambulatory Visit (HOSPITAL_COMMUNITY): Payer: BC Managed Care – PPO

## 2019-05-05 ENCOUNTER — Other Ambulatory Visit (HOSPITAL_COMMUNITY)
Admission: RE | Admit: 2019-05-05 | Discharge: 2019-05-05 | Disposition: A | Discharge status: Home / Self Care | Payer: BC Managed Care – PPO | Source: Ambulatory Visit | Attending: General Surgery | Admitting: General Surgery

## 2019-05-05 ENCOUNTER — Other Ambulatory Visit: Payer: Self-pay

## 2019-05-05 DIAGNOSIS — Z01812 Encounter for preprocedural laboratory examination: Secondary | ICD-10-CM | POA: Diagnosis present

## 2019-05-05 DIAGNOSIS — Z20822 Contact with and (suspected) exposure to covid-19: Secondary | ICD-10-CM | POA: Diagnosis not present

## 2019-05-06 LAB — SARS CORONAVIRUS 2 (TAT 6-24 HRS): SARS Coronavirus 2: NEGATIVE

## 2019-05-08 NOTE — Anesthesia Preprocedure Evaluation (Addendum)
Anesthesia Evaluation  Patient identified by MRN, date of birth, ID band Patient awake    Reviewed: Allergy & Precautions, NPO status , Patient's Chart, lab work & pertinent test results  History of Anesthesia Complications Negative for: history of anesthetic complications  Airway Mallampati: II  TM Distance: >3 FB Neck ROM: Full    Dental  (+) Dental Advisory Given, Teeth Intact   Pulmonary sleep apnea ,    Pulmonary exam normal        Cardiovascular negative cardio ROS Normal cardiovascular exam     Neuro/Psych PSYCHIATRIC DISORDERS Depression negative neurological ROS     GI/Hepatic Neg liver ROS,  S/p gastric bypass    Endo/Other  Morbid obesity  Renal/GU negative Renal ROS     Musculoskeletal negative musculoskeletal ROS (+)   Abdominal   Peds  Hematology negative hematology ROS (+)   Anesthesia Other Findings Covid neg 4/29  Reproductive/Obstetrics                            Anesthesia Physical Anesthesia Plan  ASA: III  Anesthesia Plan: MAC   Post-op Pain Management:    Induction: Intravenous  PONV Risk Score and Plan: 2 and Propofol infusion and Treatment may vary due to age or medical condition  Airway Management Planned: Natural Airway and Simple Face Mask  Additional Equipment: None  Intra-op Plan:   Post-operative Plan:   Informed Consent: I have reviewed the patients History and Physical, chart, labs and discussed the procedure including the risks, benefits and alternatives for the proposed anesthesia with the patient or authorized representative who has indicated his/her understanding and acceptance.       Plan Discussed with: CRNA and Anesthesiologist  Anesthesia Plan Comments:        Anesthesia Quick Evaluation

## 2019-05-09 ENCOUNTER — Encounter (HOSPITAL_BASED_OUTPATIENT_CLINIC_OR_DEPARTMENT_OTHER): Payer: Self-pay | Admitting: General Surgery

## 2019-05-09 ENCOUNTER — Encounter (HOSPITAL_BASED_OUTPATIENT_CLINIC_OR_DEPARTMENT_OTHER)
Admission: RE | Disposition: A | Discharge status: Home / Self Care | Payer: Self-pay | Source: Home / Self Care | Attending: General Surgery

## 2019-05-09 ENCOUNTER — Ambulatory Visit (HOSPITAL_BASED_OUTPATIENT_CLINIC_OR_DEPARTMENT_OTHER): Payer: BC Managed Care – PPO | Admitting: Anesthesiology

## 2019-05-09 ENCOUNTER — Ambulatory Visit (HOSPITAL_BASED_OUTPATIENT_CLINIC_OR_DEPARTMENT_OTHER)
Admission: RE | Admit: 2019-05-09 | Discharge: 2019-05-09 | Disposition: A | Discharge status: Home / Self Care | Payer: BC Managed Care – PPO | Attending: General Surgery | Admitting: General Surgery

## 2019-05-09 ENCOUNTER — Other Ambulatory Visit: Payer: Self-pay

## 2019-05-09 DIAGNOSIS — G4730 Sleep apnea, unspecified: Secondary | ICD-10-CM | POA: Diagnosis not present

## 2019-05-09 DIAGNOSIS — Z170 Estrogen receptor positive status [ER+]: Secondary | ICD-10-CM | POA: Diagnosis not present

## 2019-05-09 DIAGNOSIS — Z79899 Other long term (current) drug therapy: Secondary | ICD-10-CM | POA: Diagnosis not present

## 2019-05-09 DIAGNOSIS — Z9884 Bariatric surgery status: Secondary | ICD-10-CM | POA: Insufficient documentation

## 2019-05-09 DIAGNOSIS — Z452 Encounter for adjustment and management of vascular access device: Secondary | ICD-10-CM | POA: Diagnosis present

## 2019-05-09 DIAGNOSIS — C50912 Malignant neoplasm of unspecified site of left female breast: Secondary | ICD-10-CM | POA: Insufficient documentation

## 2019-05-09 DIAGNOSIS — Z6841 Body Mass Index (BMI) 40.0 and over, adult: Secondary | ICD-10-CM | POA: Diagnosis not present

## 2019-05-09 DIAGNOSIS — Z9221 Personal history of antineoplastic chemotherapy: Secondary | ICD-10-CM | POA: Diagnosis not present

## 2019-05-09 HISTORY — PX: PORT-A-CATH REMOVAL: SHX5289

## 2019-05-09 SURGERY — REMOVAL PORT-A-CATH
Anesthesia: Monitor Anesthesia Care | Site: Chest

## 2019-05-09 MED ORDER — TRAMADOL HCL 50 MG PO TABS: 50.0000 mg | ORAL_TABLET | Freq: Four times a day (QID) | ORAL | 0 refills | Status: DC | PRN

## 2019-05-09 MED ORDER — LACTATED RINGERS IV SOLN: INTRAVENOUS | Status: DC

## 2019-05-09 MED ORDER — GABAPENTIN 300 MG PO CAPS
ORAL_CAPSULE | ORAL | Status: AC
  Filled 2019-05-09: qty 1

## 2019-05-09 MED ORDER — BUPIVACAINE HCL (PF) 0.25 % IJ SOLN
INTRAMUSCULAR | Status: DC | PRN
  Administered 2019-05-09: 10 mL

## 2019-05-09 MED ORDER — LIDOCAINE HCL (CARDIAC) PF 100 MG/5ML IV SOSY
PREFILLED_SYRINGE | INTRAVENOUS | Status: DC | PRN
  Administered 2019-05-09: 60 mg via INTRAVENOUS

## 2019-05-09 MED ORDER — LIDOCAINE HCL 1 % IJ SOLN
INTRAMUSCULAR | Status: DC | PRN
  Administered 2019-05-09: 6 mL

## 2019-05-09 MED ORDER — ACETAMINOPHEN 500 MG PO TABS
1000.0000 mg | ORAL_TABLET | ORAL | Status: AC
  Administered 2019-05-09: 1000 mg via ORAL

## 2019-05-09 MED ORDER — LIDOCAINE HCL (PF) 1 % IJ SOLN
INTRAMUSCULAR | Status: AC
  Filled 2019-05-09: qty 30

## 2019-05-09 MED ORDER — ONDANSETRON HCL 4 MG/2ML IJ SOLN: 4.0000 mg | Freq: Once | INTRAMUSCULAR | Status: DC | PRN

## 2019-05-09 MED ORDER — FENTANYL CITRATE (PF) 100 MCG/2ML IJ SOLN: 25.0000 ug | INTRAMUSCULAR | Status: DC | PRN

## 2019-05-09 MED ORDER — GABAPENTIN 300 MG PO CAPS
300.0000 mg | ORAL_CAPSULE | ORAL | Status: AC
  Administered 2019-05-09: 300 mg via ORAL

## 2019-05-09 MED ORDER — PROPOFOL 500 MG/50ML IV EMUL
INTRAVENOUS | Status: DC | PRN
  Administered 2019-05-09: 100 ug/kg/min via INTRAVENOUS

## 2019-05-09 MED ORDER — CHLORHEXIDINE GLUCONATE CLOTH 2 % EX PADS: 6.0000 | MEDICATED_PAD | Freq: Once | CUTANEOUS | Status: DC

## 2019-05-09 MED ORDER — BUPIVACAINE HCL (PF) 0.25 % IJ SOLN
INTRAMUSCULAR | Status: AC
  Filled 2019-05-09: qty 150

## 2019-05-09 MED ORDER — PROPOFOL 10 MG/ML IV BOLUS
INTRAVENOUS | Status: AC
  Filled 2019-05-09: qty 40

## 2019-05-09 MED ORDER — ACETAMINOPHEN 500 MG PO TABS
ORAL_TABLET | ORAL | Status: AC
  Filled 2019-05-09: qty 2

## 2019-05-09 MED ORDER — PROPOFOL 10 MG/ML IV BOLUS
INTRAVENOUS | Status: DC | PRN
  Administered 2019-05-09 (×3): 20 mg via INTRAVENOUS

## 2019-05-09 SURGICAL SUPPLY — 26 items
BLADE SURG 15 STRL LF DISP TIS (BLADE) ×1 IMPLANT
BLADE SURG 15 STRL SS (BLADE) ×1
CHLORAPREP W/TINT 26 (MISCELLANEOUS) ×2 IMPLANT
COVER BACK TABLE 60X90IN (DRAPES) ×2 IMPLANT
COVER MAYO STAND STRL (DRAPES) ×2 IMPLANT
DECANTER SPIKE VIAL GLASS SM (MISCELLANEOUS) ×2 IMPLANT
DERMABOND ADVANCED (GAUZE/BANDAGES/DRESSINGS) ×1
DERMABOND ADVANCED .7 DNX12 (GAUZE/BANDAGES/DRESSINGS) ×1 IMPLANT
DRAPE LAPAROTOMY 100X72 PEDS (DRAPES) ×2 IMPLANT
DRAPE UTILITY XL STRL (DRAPES) ×2 IMPLANT
ELECT COATED BLADE 2.86 ST (ELECTRODE) ×1 IMPLANT
ELECT REM PT RETURN 9FT ADLT (ELECTROSURGICAL) ×2
ELECTRODE REM PT RTRN 9FT ADLT (ELECTROSURGICAL) IMPLANT
GLOVE BIO SURGEON STRL SZ 6.5 (GLOVE) ×1 IMPLANT
GLOVE BIO SURGEON STRL SZ7.5 (GLOVE) ×2 IMPLANT
GOWN STRL REUS W/ TWL LRG LVL3 (GOWN DISPOSABLE) ×2 IMPLANT
GOWN STRL REUS W/TWL LRG LVL3 (GOWN DISPOSABLE) ×2
NDL HYPO 25X1 1.5 SAFETY (NEEDLE) ×1 IMPLANT
NEEDLE HYPO 25X1 1.5 SAFETY (NEEDLE) ×2 IMPLANT
PENCIL SMOKE EVACUATOR (MISCELLANEOUS) ×1 IMPLANT
SET BASIN DAY SURGERY F.S. (CUSTOM PROCEDURE TRAY) ×2 IMPLANT
SUT MON AB 4-0 PC3 18 (SUTURE) ×2 IMPLANT
SUT VIC AB 3-0 SH 27 (SUTURE) ×1
SUT VIC AB 3-0 SH 27X BRD (SUTURE) ×1 IMPLANT
SYR CONTROL 10ML LL (SYRINGE) ×2 IMPLANT
TOWEL GREEN STERILE FF (TOWEL DISPOSABLE) ×2 IMPLANT

## 2019-05-09 NOTE — Interval H&P Note (Signed)
History and Physical Interval Note:  05/09/2019 8:16 AM  Caitlin Graham  has presented today for surgery, with the diagnosis of LEFT BREAST CANCER.  The various methods of treatment have been discussed with the patient and family. After consideration of risks, benefits and other options for treatment, the patient has consented to  Procedure(s): PORT REMOVAL (N/A) as a surgical intervention.  The patient's history has been reviewed, patient examined, no change in status, stable for surgery.  I have reviewed the patient's chart and labs.  Questions were answered to the patient's satisfaction.     Autumn Messing III

## 2019-05-09 NOTE — Transfer of Care (Signed)
Immediate Anesthesia Transfer of Care Note  Patient: Caitlin Graham  Procedure(s) Performed: PORT REMOVAL (N/A Chest)  Patient Location: PACU  Anesthesia Type:MAC  Level of Consciousness: awake, alert , oriented and patient cooperative  Airway & Oxygen Therapy: Patient Spontanous Breathing  Post-op Assessment: Report given to RN and Post -op Vital signs reviewed and stable  Post vital signs: Reviewed and stable  Last Vitals:  Vitals Value Taken Time  BP 100/39   Temp    Pulse 73 05/09/19 0902  Resp 13   SpO2 98 % 05/09/19 0902  Vitals shown include unvalidated device data.  Last Pain:  Vitals:   05/09/19 0754  TempSrc: Tympanic  PainSc: 0-No pain         Complications: No apparent anesthesia complications

## 2019-05-09 NOTE — Anesthesia Postprocedure Evaluation (Signed)
Anesthesia Post Note  Patient: Caitlin Graham  Procedure(s) Performed: PORT REMOVAL (N/A Chest)     Patient location during evaluation: PACU Anesthesia Type: MAC Level of consciousness: awake and alert Pain management: pain level controlled Vital Signs Assessment: post-procedure vital signs reviewed and stable Respiratory status: spontaneous breathing, nonlabored ventilation and respiratory function stable Cardiovascular status: stable and blood pressure returned to baseline Anesthetic complications: no    Last Vitals:  Vitals:   05/09/19 0915 05/09/19 0924  BP: (!) 93/53 93/60  Pulse: 63 (!) 58  Resp: 13 11  Temp:    SpO2: 100% 100%    Last Pain:  Vitals:   05/09/19 0924  TempSrc:   PainSc: 0-No pain                 Audry Pili

## 2019-05-09 NOTE — Anesthesia Procedure Notes (Signed)
Procedure Name: MAC Date/Time: 05/09/2019 8:28 AM Performed by: Oletta Lamas, CRNA Pre-anesthesia Checklist: Patient identified, Emergency Drugs available, Suction available and Patient being monitored Patient Re-evaluated:Patient Re-evaluated prior to induction Oxygen Delivery Method: Simple face mask

## 2019-05-09 NOTE — H&P (Signed)
Caitlin Graham  Location: [facility] Surgery Patient #: [id number] DOB: [birth year] Married / Language: English / Race: White Female   History of Present Illness  The patient is a 54 year old female who presents for a follow-up for Breast cancer. The patient is a 54 year old white female who is about 6 months status post left breast lumpectomy and sentinel node biopsy for a T1 cN0 left breast cancer that was ER and PR positive and HER-2 negative with a Ki-67 of 50%. Her Oncotype score was 32 so she did receive chemotherapy which finished about 3 weeks ago. She is scheduled to start radiation therapy next week. Her main complaint is of some tenderness and a palpable cord in the left axilla that extends down towards the left inner elbow.   Allergies  Codeine/Codeine Derivatives  Abdominal pain. Aspirin *ANALGESICS - NonNarcotic*  Swelling. Norco *ANALGESICS - OPIOID*  Abdominal pain. Ibuprofen *ANALGESICS - ANTI-INFLAMMATORY*  Status Post gastric bypass Promethazine HCl *ANTIHISTAMINES*  Anxiety Allergies Reconciled   Medication History  Ondansetron HCl ('8MG'$  Tablet, Oral) Active. traMADol HCl ('50MG'$  Tablet, Oral) Active. Medications Reconciled    Review of Systems  General Not Present- Appetite Loss, Chills, Fatigue, Fever, Night Sweats, Weight Gain and Weight Loss. Skin Not Present- Change in Wart/Mole, Dryness, Hives, Jaundice, New Lesions, Non-Healing Wounds, Rash and Ulcer. HEENT Not Present- Earache, Hearing Loss, Hoarseness, Nose Bleed, Oral Ulcers, Ringing in the Ears, Seasonal Allergies, Sinus Pain, Sore Throat, Visual Disturbances, Wears glasses/contact lenses and Yellow Eyes. Respiratory Not Present- Bloody sputum, Chronic Cough, Difficulty Breathing, Snoring and Wheezing. Breast Not Present- Breast Mass, Breast Pain, Nipple Discharge and Skin Changes. Cardiovascular Not Present- Chest Pain, Difficulty Breathing Lying Down, Leg Cramps, Palpitations, Rapid Heart  Rate, Shortness of Breath and Swelling of Extremities. Gastrointestinal Not Present- Abdominal Pain, Bloating, Bloody Stool, Change in Bowel Habits, Chronic diarrhea, Constipation, Difficulty Swallowing, Excessive gas, Gets full quickly at meals, Hemorrhoids, Indigestion, Nausea, Rectal Pain and Vomiting. Female Genitourinary Not Present- Frequency, Nocturia, Painful Urination, Pelvic Pain and Urgency. Musculoskeletal Not Present- Back Pain, Joint Pain, Joint Stiffness, Muscle Pain, Muscle Weakness and Swelling of Extremities. Neurological Not Present- Decreased Memory, Fainting, Headaches, Numbness, Seizures, Tingling, Tremor, Trouble walking and Weakness. Psychiatric Not Present- Anxiety, Bipolar, Change in Sleep Pattern, Depression, Fearful and Frequent crying. Endocrine Not Present- Cold Intolerance, Excessive Hunger, Hair Changes, Heat Intolerance, Hot flashes and New Diabetes. Hematology Not Present- Blood Thinners, Easy Bruising, Excessive bleeding, Gland problems, HIV and Persistent Infections.  Vitals  Weight: 282.25 lb Height: 65in Body Surface Area: 2.29 m Body Mass Index: 46.97 kg/m  Temp.: 97.69F  Pulse: 97 (Regular)  BP: 132/78(Sitting, Left Arm, Standard)       Physical Exam  General Mental Status-Alert. General Appearance-Consistent with stated age. Hydration-Well hydrated. Voice-Normal.  Head and Neck Head-normocephalic, atraumatic with no lesions or palpable masses. Trachea-midline. Thyroid Gland Characteristics - normal size and consistency.  Eye Eyeball - Bilateral-Extraocular movements intact. Sclera/Conjunctiva - Bilateral-No scleral icterus.  Chest and Lung Exam Chest and lung exam reveals -quiet, even and easy respiratory effort with no use of accessory muscles and on auscultation, normal breath sounds, no adventitious sounds and normal vocal resonance. Inspection Chest Wall - Normal. Back - normal.  Breast Note:  There is no palpable mass in either breast. There is no palpable axillary, subclavicular, or cervical lymphadenopathy. Her port site looks good. There is some palpable cording from the left axilla down towards the inner left elbow   Cardiovascular Cardiovascular examination reveals -normal  heart sounds, regular rate and rhythm with no murmurs and normal pedal pulses bilaterally.  Abdomen Inspection Inspection of the abdomen reveals - No Hernias. Skin - Scar - no surgical scars. Palpation/Percussion Palpation and Percussion of the abdomen reveal - Soft, Non Tender, No Rebound tenderness, No Rigidity (guarding) and No hepatosplenomegaly. Auscultation Auscultation of the abdomen reveals - Bowel sounds normal.  Neurologic Neurologic evaluation reveals -alert and oriented x 3 with no impairment of recent or remote memory. Mental Status-Normal.  Musculoskeletal Normal Exam - Left-Upper Extremity Strength Normal and Lower Extremity Strength Normal. Normal Exam - Right-Upper Extremity Strength Normal and Lower Extremity Strength Normal.  Lymphatic Head & Neck  General Head & Neck Lymphatics: Bilateral - Description - Normal. Axillary  General Axillary Region: Bilateral - Description - Normal. Tenderness - Non Tender. Femoral & Inguinal  Generalized Femoral & Inguinal Lymphatics: Bilateral - Description - Normal. Tenderness - Non Tender.    Assessment & Plan  MALIGNANT NEOPLASM OF UPPER-INNER QUADRANT OF LEFT BREAST IN FEMALE, ESTROGEN RECEPTOR POSITIVE (C50.212) Impression: The patient is about 6 months status post left breast lumpectomy for breast cancer. She continues to do well and no clinical evidence of recurrence. At this point I will refer her to physical therapy for some cording in the left axilla. She has completed chemotherapy and would like her port removed. I have discussed with her in detail the risks and benefits of the operation as well as some of the technical  aspects and she understands and wishes to proceed. She would like some sedation. She will begin radiation therapy next week. I will plan to see her back in about 6 months. Current Plans Referred to Physical Therapy, for evaluation and follow up (Physical Therapy). Routine. Follow up with [place] in the office in 6 MONTHS.   Call us sooner as needed.

## 2019-05-09 NOTE — Discharge Instructions (Signed)
  Post Anesthesia Home Care Instructions  Activity: Get plenty of rest for the remainder of the day. A responsible individual must stay with you for 24 hours following the procedure.  For the next 24 hours, DO NOT: -Drive a car -Paediatric nurse -Drink alcoholic beverages -Take any medication unless instructed by your physician -Make any legal decisions or sign important papers.  Meals: Start with liquid foods such as gelatin or soup. Progress to regular foods as tolerated. Avoid greasy, spicy, heavy foods. If nausea and/or vomiting occur, drink only clear liquids until the nausea and/or vomiting subsides. Call your physician if vomiting continues.  Special Instructions/Symptoms: Your throat may feel dry or sore from the anesthesia or the breathing tube placed in your throat during surgery. If this causes discomfort, gargle with warm salt water. The discomfort should disappear within 24 hours.     Call your surgeon if you experience:   1.  Fever over 101.0. 2.  Inability to urinate. 3.  Nausea and/or vomiting. 4.  Extreme swelling or bruising at the surgical site. 5.  Continued bleeding from the incision. 6.  Increased pain, redness or drainage from the incision. 7.  Problems related to your pain medication. 8.  Any problems and/or concerns  *May have Tylenol at 2pm today 05/09/19

## 2019-05-09 NOTE — Op Note (Signed)
05/09/2019  8:57 AM  PATIENT:  Caitlin Graham  54 y.o. female  PRE-OPERATIVE DIAGNOSIS:  LEFT BREAST CANCER  POST-OPERATIVE DIAGNOSIS:  LEFT BREAST CANCER  PROCEDURE:  Procedure(s): PORT REMOVAL (N/A)  SURGEON:  Surgeon(s) and Role:    * [name], MD - Primary  PHYSICIAN ASSISTANT:   ASSISTANTS: none   ANESTHESIA:   local and general  EBL:  minimal   BLOOD ADMINISTERED:none  DRAINS: none   LOCAL MEDICATIONS USED:  MARCAINE    and LIDOCAINE   SPECIMEN:  No Specimen  DISPOSITION OF SPECIMEN:  N/A  COUNTS:  YES  TOURNIQUET:  * No tourniquets in log *  DICTATION: .Dragon Dictation   After informed consent was obtained the patient was brought to the operating room and placed in the supine position on the operating table.  After adequate IV sedation had been given the patient's right chest and neck area were prepped with ChloraPrep, allowed to dry, and draped in usual sterile manner.  An appropriate timeout was performed.  The area around the port was infiltrated with 1% lidocaine and quarter percent Marcaine until a good field block was created.  A small incision was then made through her previous incision with a 15 blade knife.  The incision was carried through the subcutaneous tissue sharply with a 15 blade knife until the capsule surrounding the port was opened.  The 2 anchoring stitches were divided and removed.  The port was then gently pushed out of the pocket and with gentle traction was removed from the patient without difficulty.  Pressure was held on the neck and chest wall for several minutes until the area was completely hemostatic.  The deep layer of the wound was then closed with interrupted 3-0 Vicryl stitches.  The skin was then closed with a running 4-0 Monocryl subcuticular stitch.  Dermabond dressings were applied.  The patient tolerated the procedure well.  At the end of the case all needle sponge and instrument counts were correct.  The patient was then  awakened and taken to recovery in stable condition.  PLAN OF CARE: Discharge to home after PACU  PATIENT DISPOSITION:  PACU - hemodynamically stable.   Delay start of Pharmacological VTE agent (>24hrs) due to surgical blood loss or risk of bleeding: not applicable

## 2019-05-10 ENCOUNTER — Encounter: Payer: Self-pay | Admitting: *Deleted

## 2019-05-10 NOTE — Addendum Note (Signed)
Addendum  created 05/10/19 0941 by Tawni Millers, CRNA   Charge Capture section accepted

## 2019-08-12 ENCOUNTER — Other Ambulatory Visit: Payer: Self-pay

## 2019-08-12 ENCOUNTER — Other Ambulatory Visit: Payer: Self-pay | Admitting: Hematology and Oncology

## 2019-08-12 ENCOUNTER — Inpatient Hospital Stay: Payer: BC Managed Care – PPO | Attending: Adult Health | Admitting: Adult Health

## 2019-08-12 VITALS — BP 122/75 | HR 70 | Temp 98.2°F | Resp 20 | Ht 65.5 in | Wt 278.8 lb

## 2019-08-12 DIAGNOSIS — Z9884 Bariatric surgery status: Secondary | ICD-10-CM | POA: Diagnosis not present

## 2019-08-12 DIAGNOSIS — R232 Flushing: Secondary | ICD-10-CM | POA: Diagnosis not present

## 2019-08-12 DIAGNOSIS — R5383 Other fatigue: Secondary | ICD-10-CM | POA: Insufficient documentation

## 2019-08-12 DIAGNOSIS — Z79810 Long term (current) use of selective estrogen receptor modulators (SERMs): Secondary | ICD-10-CM | POA: Insufficient documentation

## 2019-08-12 DIAGNOSIS — Z170 Estrogen receptor positive status [ER+]: Secondary | ICD-10-CM

## 2019-08-12 DIAGNOSIS — R52 Pain, unspecified: Secondary | ICD-10-CM | POA: Diagnosis not present

## 2019-08-12 DIAGNOSIS — I890 Lymphedema, not elsewhere classified: Secondary | ICD-10-CM | POA: Diagnosis not present

## 2019-08-12 DIAGNOSIS — C50212 Malignant neoplasm of upper-inner quadrant of left female breast: Secondary | ICD-10-CM | POA: Diagnosis not present

## 2019-08-12 MED ORDER — TRAMADOL HCL 50 MG PO TABS: 50.0000 mg | ORAL_TABLET | Freq: Four times a day (QID) | ORAL | 0 refills | Status: DC | PRN

## 2019-08-12 NOTE — Progress Notes (Addendum)
SURVIVORSHIP VISIT:   BRIEF ONCOLOGIC HISTORY:  Oncology History  Malignant neoplasm of upper-inner quadrant of left breast in female, estrogen receptor positive ([place])  07/05/2018 Initial Diagnosis   Screening mammogram detected bilateral breast masses. Diagnostic mammogram and US showed cysts in the right breast and a 1.2cm left breast mass at the 9 o'clock position. 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    07/07/2018 Cancer Staging   Staging form: Breast, AJCC 8th Edition - Clinical stage from 07/07/2018: Stage IA (cT1c, cN0, cM0, G2, ER+, PR+, HER2-)   07/18/2018 Genetic Testing   Negative testing. No pathogenic variants identified on the [facility] Common Hereditary Cancers Panel. The Common Hereditary Cancers Panel offered by [facility] includes sequencing and/or deletion duplication testing of the following 47 genes: APC, ATM, AXIN2, BARD1, BMPR1A, BRCA1, BRCA2, BRIP1, CDH1, CDKN2A (p14ARF), CDKN2A (p16INK4a), CKD4, CHEK2, CTNNA1, DICER1, EPCAM (Deletion/duplication testing only), GREM1 (promoter region deletion/duplication testing only), KIT, MEN1, MLH1, MSH2, MSH3, MSH6, MUTYH, NBN, NF1, NHTL1, PALB2, PDGFRA, PMS2, POLD1, POLE, PTEN, RAD50, RAD51C, RAD51D, SDHB, SDHC, SDHD, SMAD4, SMARCA4. STK11, TP53, TSC1, TSC2, and VHL.  The following genes were evaluated for sequence changes only: SDHA and HOXB13 c.251G>A variant only. The report date is 07/18/2018.   08/04/2018 Surgery   Left lumpectomy Caitlin Graham) [phone]): IDC with intermediate grade DCIS, 1.2cm, grade 1, clear margins, with 3 axillary lymph nodes negative for carcinoma ER 100%, PR 60%, Ki-67 50%   08/04/2018 Oncotype testing   The Oncotype DX score was 32 predicting a risk of outside the breast recurrence over the next 9 years of 20% if the patient's only systemic therapy is tamoxifen for 5 years.    09/07/2018 - 02/14/2019 Chemotherapy   dexamethasone (DECADRON) 4 MG tablet, 1 of 1 cycle, Start date:  08/25/2018, End date: 11/01/2018  DOXOrubicin (ADRIAMYCIN) chemo injection 146 mg, 60 mg/m2 = 146 mg, Intravenous,  Once, 4 of 4 cycles. Administration: 146 mg (09/07/2018), 146 mg (09/20/2018), 146 mg (10/04/2018), 146 mg (10/18/2018)  palonosetron (ALOXI) injection 0.25 mg, 0.25 mg, Intravenous,  Once, 4 of 4 cycles. Administration: 0.25 mg (09/07/2018), 0.25 mg (09/20/2018), 0.25 mg (10/04/2018), 0.25 mg (10/18/2018)  pegfilgrastim-jmdb (FULPHILA) injection 6 mg, 6 mg, Subcutaneous,  Once, 4 of 4 cycles. Administration: 6 mg (09/09/2018), 6 mg (09/22/2018), 6 mg (10/06/2018), 6 mg (10/20/2018)  cyclophosphamide (CYTOXAN) 1,460 mg in sodium chloride 0.9 % 250 mL chemo infusion, 600 mg/m2 = 1,460 mg, Intravenous,  Once, 4 of 4 cycles. Administration: 1,460 mg (09/07/2018), 1,460 mg (09/20/2018), 1,460 mg (10/04/2018), 1,460 mg (10/18/2018)  PACLitaxel (TAXOL) 198 mg in sodium chloride 0.9 % 250 mL chemo infusion (</= 70m/m2), 80 mg/m2 = 198 mg, Intravenous,  Once, 12 of 12 cycles. Dose modification: 65 mg/m2 (original dose 80 mg/m2, Cycle 9, Reason: Provider Judgment, Comment: leukopenia), 50 mg/m2 (original dose 80 mg/m2, Cycle 14, Reason: Dose not tolerated). Administration: 198 mg (11/01/2018), 198 mg (12/06/2018), 198 mg (12/13/2018), 156 mg (12/20/2018), 156 mg (12/27/2018), 156 mg (01/03/2019), 156 mg (01/10/2019), 156 mg (01/17/2019), 156 mg (01/24/2019), 120 mg (01/31/2019), 120 mg (02/07/2019), 120 mg (02/14/2019)  fosaprepitant (EMEND) 150 mg  dexamethasone (DECADRON) 12 mg in sodium chloride 0.9 % 145 mL IVPB, , Intravenous,  Once, 4 of 4 cycles. Administration:  (09/07/2018),  (09/20/2018),  (10/04/2018),  (10/18/2018)   03/2019 -  Radiation Therapy   [facility]  04/2019 -  Anti-estrogen oral therapy   Tamoxifen     INTERVAL HISTORY:  Ms.  Graham to review her survivorship care plan detailing her treatment course for breast cancer, as well as monitoring long-term side effects of that treatment,  education regarding health maintenance, screening, and overall wellness and health promotion.     Overall, Caitlin Graham reports feeling well.  She is taking Tamoxifen daily and tolerates it well.  She has some hot flashes that she is tolerating.  She notes that when she started to receive chemotherapy, she developed achiness.  She takes tylenol for the most part for her achiness, however sometimes, when the achiness is contributing to her being unable to fall asleep she will take a tramadol.  She would like this refilled today.  A survey was completed by Caitlin Graham.  In addition to the above, she also notes some weight issues, however she states that these weight issues have been with her all of her life. She notes fatigue, and says that she has undergone gastric bypass previously.  She is not taking any b12, and notes that she hasn't had her labs checked recently.  She has been seeing physical therapy for lymphedema.    REVIEW OF SYSTEMS:  Review of Systems  Constitutional: Positive for fatigue. Negative for appetite change, chills, fever and unexpected weight change.  HENT:   Negative for hearing loss, lump/mass, sore throat and trouble swallowing.   Eyes: Negative for eye problems and icterus.  Respiratory: Negative for chest tightness, cough and shortness of breath.   Cardiovascular: Negative for chest pain, leg swelling and palpitations.  Gastrointestinal: Negative for abdominal distention, abdominal pain, constipation, diarrhea, nausea and vomiting.  Endocrine: Negative for hot flashes.  Genitourinary: Negative for difficulty urinating.   Musculoskeletal: Positive for arthralgias.  Skin: Negative for itching and rash.  Neurological: Negative for dizziness, extremity weakness, headaches and numbness.  Hematological: Negative for adenopathy. Does not bruise/bleed easily.  Psychiatric/Behavioral: Negative for depression. The patient is not nervous/anxious.    Breast: Denies any new nodularity, masses,  tenderness, nipple changes, or nipple discharge.      ONCOLOGY TREATMENT TEAM:  1. Surgeon:  Dr. Toth at [facility] 2. Medical Oncologist: Dr. Gudena  3. Radiation Oncologist: Dr. Squire    PAST MEDICAL/SURGICAL HISTORY:  Past Medical History:  Diagnosis Date  . BMI 45.0-49.9, adult ([facility])   . Depression   . Family history of ovarian cancer   . Family history of stomach cancer   . Sleep apnea    resolved with gastic bypass   Past Surgical History:  Procedure Laterality Date  . BREAST LUMPECTOMY WITH RADIOACTIVE SEED AND SENTINEL LYMPH NODE BIOPSY Left 08/04/2018   Procedure: LEFT BREAST LUMPECTOMY WITH RADIOACTIVE SEED AND SENTINEL LYMPH NODE BIOPSY;  Surgeon: Toth, Paul III, MD;  Location: [facility];  Service: General;  Laterality: Left;  . gastric bypass    . left ulnar     . PORT-A-CATH REMOVAL N/A 05/09/2019   Procedure: PORT REMOVAL;  Surgeon: Toth, Paul III, MD;  Location: [facility];  Service: General;  Laterality: N/A;  . PORTACATH PLACEMENT Right 09/06/2018   Procedure: INSERTION PORT-A-CATHETER;  Surgeon: Toth, Paul III, MD;  Location: [facility];  Service: General;  Laterality: Right;     ALLERGIES:  Allergies  Allergen Reactions  . Aspirin Swelling    And aleve; swelling in hands and face   . Ibuprofen     S/p gastric bypass  . Hydrocodone Nausea Only    Stomach pains  . Oxycodone Nausea Only      Stomach pains  . Phenergan [Promethazine] Anxiety     CURRENT MEDICATIONS:  Outpatient Encounter Medications as of 08/12/2019  Medication Sig  . acetaminophen (TYLENOL) 325 MG tablet Take 650 mg by mouth every 6 (six) hours as needed.  . clindamycin (CLINDAGEL) 1 % gel Apply topically 2 (two) times daily.  . tamoxifen (NOLVADEX) 20 MG tablet Take 1 tablet (20 mg total) by mouth daily.  . [DISCONTINUED] LORazepam (ATIVAN) 0.5 MG tablet Take 1 tablet (0.5 mg total) by mouth at bedtime as needed for sleep.   . [DISCONTINUED] traMADol (ULTRAM) 50 MG tablet Take 1-2 tablets (50-100 mg total) by mouth every 6 (six) hours as needed.  . [DISCONTINUED] traMADol (ULTRAM) 50 MG tablet Take 1 tablet (50 mg total) by mouth every 6 (six) hours as needed.   No facility-administered encounter medications on file as of 08/12/2019.     ONCOLOGIC FAMILY HISTORY:  Family History  Problem Relation Age of Onset  . Bone cancer Paternal Grandfather   . Ovarian cancer Paternal Aunt   . Stomach cancer Cousin        dx young     GENETIC COUNSELING/TESTING: See above  SOCIAL HISTORY:  Social History   Socioeconomic History  . Marital status: Married    Spouse name: Not on file  . Number of children: Not on file  . Years of education: Not on file  . Highest education level: Not on file  Occupational History  . Not on file  Tobacco Use  . Smoking status: Never Smoker  . Smokeless tobacco: Never Used  Substance and Sexual Activity  . Alcohol use: Not Currently  . Drug use: No  . Sexual activity: Yes    Birth control/protection: Condom  Other Topics Concern  . Not on file  Social History Narrative   ** Merged History Encounter **       Social Determinants of Health   Financial Resource Strain:   . Difficulty of Paying Living Expenses:   Food Insecurity:   . Worried About Running Out of Food in the Last Year:   . Ran Out of Food in the Last Year:   Transportation Needs:   . Lack of Transportation (Medical):   . Lack of Transportation (Non-Medical):   Physical Activity:   . Days of Exercise per Week:   . Minutes of Exercise per Session:   Stress:   . Feeling of Stress :   Social Connections:   . Frequency of Communication with Friends and Family:   . Frequency of Social Gatherings with Friends and Family:   . Attends Religious Services:   . Active Member of Clubs or Organizations:   . Attends Club or Organization Meetings:   . Marital Status:   Intimate Partner Violence:   . Fear of  Current or Ex-Partner:   . Emotionally Abused:   . Physically Abused:   . Sexually Abused:      OBSERVATIONS/OBJECTIVE:  BP 122/75 (BP Location: Left Arm, Patient Position: Sitting)   Pulse 70   Temp 98.2 F (36.8 C) (Temporal)   Resp 20   Ht 5' 5.5" (1.664 m)   Wt 278 lb 12.8 oz (126.5 kg)   SpO2 100% Comment: R A  BMI 45.69 kg/m  GENERAL: Patient is a well appearing female in no acute distress HEENT:  Sclerae anicteric.  Mask in place. Neck is supple.  NODES:  No cervical, supraclavicular, or axillary lymphadenopathy palpated.  BREAST EXAM: left breast s/p lumpectomy and radiation, right   breast benign LUNGS:  Clear to auscultation bilaterally.  No wheezes or rhonchi. HEART:  Regular rate and rhythm. No murmur appreciated. ABDOMEN:  Soft, nontender.  Positive, normoactive bowel sounds. No organomegaly palpated. MSK:  No focal spinal tenderness to palpation. Full range of motion bilaterally in the upper extremities. EXTREMITIES: slight left arm swelling noted SKIN:  Clear with no obvious rashes or skin changes. No nail dyscrasia. NEURO:  Nonfocal. Well oriented.  Appropriate affect.   LABORATORY DATA:  None for this visit.  DIAGNOSTIC IMAGING:  None for this visit.      ASSESSMENT AND PLAN:  Ms.. Jeudy is a pleasant 54 y.o. female with Stage IA left breast invasive ductal carcinoma, ER+/PR+/HER2-, diagnosed in 06/2018, treated with lumpectomy, adjuvant chemotherpay, adjuvant radiation therapy, and anti-estrogen therapy with Tamoxifen beginning in 04/2019.  She presents to the Survivorship Clinic for our initial meeting and routine follow-up post-completion of treatment for breast cancer.    1. Stage IA left breast cancer:  Ms. Reisen is continuing to recover from definitive treatment for breast cancer. She will follow-up with her medical oncologist, Dr. Gudena in 10/2019 months with history and physical exam per surveillance protocol.  She will continue her anti-estrogen  therapy with Tamoxifen. Thus far, she is tolerating the Tamoxifen well, with minimal side effects. She was instructed to make Dr. Gudena or myself aware if she begins to experience any worsening side effects of the medication and I could see her back in clinic to help manage those side effects, as needed. Her mammogram is due; orders placed today. Today, a comprehensive survivorship care plan and treatment summary was reviewed with the patient today detailing her breast cancer diagnosis, treatment course, potential late/long-term effects of treatment, appropriate follow-up care with recommendations for the future, and patient education resources.  A copy of this summary, along with a letter will be sent to the patient's primary care provider via mail/fax/In Basket message after today's visit.    2. Aches/pains: She was recommended yoga, tai chi, swimming, to help with relief.  Her BMI increases the severity of these aches and pains, and weight loss was discussed as well.  She will continue taking tylenol, and Dr. Gudena noted that he would refill her tramadol as well.   3. Fatigue: Her fatigue could just be related to having received chemotherapy and going back to work.  However, she has undergone gastric bypass, and could have several deficiencies, particularly since she underwent chemotherapy.  I have placed orders to have these evaluated.     4. Bone health:  She was given education on specific activities to promote bone health.  5. Cancer screening:  Due to Ms. Quirion's history and her age, she should receive screening for skin cancers, colon cancer, and gynecologic cancers.  The information and recommendations are listed on the patient's comprehensive care plan/treatment summary and were reviewed in detail with the patient.    6. Health maintenance and wellness promotion: Ms. Gilcrest was encouraged to consume 5-7 servings of fruits and vegetables per day. We reviewed the "Nutrition Rainbow" handout,  as well as the handout "Take Control of Your Health and Reduce Your Cancer Risk" from the [employer] Society.  She was also encouraged to engage in moderate to vigorous exercise for 30 minutes per day most days of the week. We discussed the [employer] fitness program, which is designed for cancer survivors to help them become more physically fit after cancer treatments.  She was instructed to limit her alcohol   consumption and continue to abstain from tobacco use.     7. Support services/counseling: It is not uncommon for this period of the patient's cancer care trajectory to be one of many emotions and stressors.  We discussed how this can be increasingly difficult during the times of quarantine and social distancing due to the COVID-19 pandemic.   She was given information regarding our available services and encouraged to contact me with any questions or for help enrolling in any of our support group/programs.   8. Lymphedema: Managed by physical therapy.  Awaiting compression pump as she has failed compression sleeves and PT.     Follow up instructions:    -Return to cancer center in 10/2019 for f/u with Dr. Lindi Adie  -Mammogram due in 09/2019 -Follow up with Dr. Marlou Graham -She is welcome to return back to the Survivorship Clinic at any time; no additional follow-up needed at this time.  -Consider referral back to survivorship as a long-term survivor for continued surveillance  The patient was provided an opportunity to ask questions and all were answered. The patient agreed with the plan and demonstrated an understanding of the instructions.   Total encounter time: 45 minutes*  Wilber Bihari, NP 08/14/19 6:51 AM Medical Oncology and Hematology [facility] [place], [place] [address] Tel. [phone]    Fax. [phone]

## 2019-08-14 ENCOUNTER — Encounter: Payer: Self-pay | Admitting: Adult Health

## 2019-08-15 ENCOUNTER — Inpatient Hospital Stay: Payer: BC Managed Care – PPO

## 2019-08-15 ENCOUNTER — Telehealth: Payer: Self-pay | Admitting: *Deleted

## 2019-08-15 ENCOUNTER — Other Ambulatory Visit: Payer: Self-pay | Admitting: *Deleted

## 2019-08-15 ENCOUNTER — Ambulatory Visit: Payer: BC Managed Care – PPO | Attending: Adult Health | Admitting: Physical Therapy

## 2019-08-15 ENCOUNTER — Other Ambulatory Visit: Payer: Self-pay

## 2019-08-15 ENCOUNTER — Encounter: Payer: Self-pay | Admitting: Physical Therapy

## 2019-08-15 DIAGNOSIS — M25612 Stiffness of left shoulder, not elsewhere classified: Secondary | ICD-10-CM | POA: Diagnosis present

## 2019-08-15 DIAGNOSIS — C50212 Malignant neoplasm of upper-inner quadrant of left female breast: Secondary | ICD-10-CM

## 2019-08-15 DIAGNOSIS — I890 Lymphedema, not elsewhere classified: Secondary | ICD-10-CM | POA: Diagnosis not present

## 2019-08-15 DIAGNOSIS — Z170 Estrogen receptor positive status [ER+]: Secondary | ICD-10-CM

## 2019-08-15 DIAGNOSIS — R293 Abnormal posture: Secondary | ICD-10-CM | POA: Diagnosis present

## 2019-08-15 LAB — CBC WITH DIFFERENTIAL (CANCER CENTER ONLY)
Abs Immature Granulocytes: 0.05 10*3/uL (ref 0.00–0.07)
Basophils Absolute: 0 10*3/uL (ref 0.0–0.1)
Basophils Relative: 1 %
Eosinophils Absolute: 0.1 10*3/uL (ref 0.0–0.5)
Eosinophils Relative: 1 %
HCT: 38.5 % (ref 36.0–46.0)
Hemoglobin: 12.1 g/dL (ref 12.0–15.0)
Immature Granulocytes: 1 %
Lymphocytes Relative: 28 %
Lymphs Abs: 2.2 10*3/uL (ref 0.7–4.0)
MCH: 28.3 pg (ref 26.0–34.0)
MCHC: 31.4 g/dL (ref 30.0–36.0)
MCV: 90 fL (ref 80.0–100.0)
Monocytes Absolute: 0.9 10*3/uL (ref 0.1–1.0)
Monocytes Relative: 11 %
Neutro Abs: 4.7 10*3/uL (ref 1.7–7.7)
Neutrophils Relative %: 58 %
Platelet Count: 230 10*3/uL (ref 150–400)
RBC: 4.28 MIL/uL (ref 3.87–5.11)
RDW: 16.7 % — ABNORMAL HIGH (ref 11.5–15.5)
WBC Count: 8 10*3/uL (ref 4.0–10.5)
nRBC: 0 % (ref 0.0–0.2)

## 2019-08-15 LAB — CMP (CANCER CENTER ONLY)
ALT: 12 U/L (ref 0–44)
AST: 10 U/L — ABNORMAL LOW (ref 15–41)
Albumin: 3.5 g/dL (ref 3.5–5.0)
Alkaline Phosphatase: 73 U/L (ref 38–126)
Anion gap: 7 (ref 5–15)
BUN: 14 mg/dL (ref 6–20)
CO2: 25 mmol/L (ref 22–32)
Calcium: 9.3 mg/dL (ref 8.9–10.3)
Chloride: 109 mmol/L (ref 98–111)
Creatinine: 0.9 mg/dL (ref 0.44–1.00)
GFR, Est AFR Am: >60 mL/min (ref >60)
GFR, Estimated: >60 mL/min (ref >60)
Glucose, Bld: 94 mg/dL (ref 70–99)
Potassium: 4.2 mmol/L (ref 3.5–5.1)
Sodium: 141 mmol/L (ref 135–145)
Total Bilirubin: 0.4 mg/dL (ref 0.3–1.2)
Total Protein: 7.1 g/dL (ref 6.5–8.1)

## 2019-08-15 LAB — IRON AND TIBC
Iron: 72 ug/dL (ref 41–142)
Saturation Ratios: 16 % — ABNORMAL LOW (ref 21–57)
TIBC: 445 ug/dL — ABNORMAL HIGH (ref 236–444)
UIBC: 373 ug/dL (ref 120–384)

## 2019-08-15 LAB — VITAMIN B12: Vitamin B-12: 151 pg/mL — ABNORMAL LOW (ref 180–914)

## 2019-08-15 LAB — FERRITIN: Ferritin: 9 ng/mL — ABNORMAL LOW (ref 11–307)

## 2019-08-15 LAB — TSH: TSH: 1.393 u[IU]/mL (ref 0.308–3.960)

## 2019-08-15 LAB — VITAMIN D 25 HYDROXY (VIT D DEFICIENCY, FRACTURES): Vit D, 25-Hydroxy: 11.33 ng/mL — ABNORMAL LOW (ref 30–100)

## 2019-08-15 NOTE — Therapy (Signed)
[place], [place], [address] Phone: [phone]   Fax:  [phone]  Physical Therapy Evaluation  Patient Details  Name: Caitlin Graham MRN: [id number] Date of Birth: 05/30/1965 Referring Provider (PT): Causey   Encounter Date: 08/15/2019   PT End of Session - 08/15/19 1345    Visit Number 1    Number of Visits 1    PT Start Time 1301    PT Stop Time 1345    PT Time Calculation (min) 44 min    Activity Tolerance Patient tolerated treatment well    Behavior During Therapy [facility] for tasks assessed/performed           Past Medical History:  Diagnosis Date  . BMI 45.0-49.9, adult ([place])   . Depression   . Family history of ovarian cancer   . Family history of stomach cancer   . Sleep apnea    resolved with gastic bypass    Past Surgical History:  Procedure Laterality Date  . BREAST LUMPECTOMY WITH RADIOACTIVE SEED AND SENTINEL LYMPH NODE BIOPSY Left 08/04/2018   Procedure: LEFT BREAST LUMPECTOMY WITH RADIOACTIVE SEED AND SENTINEL LYMPH NODE BIOPSY;  Surgeon: [name], MD;  Location: [place];  Service: General;  Laterality: Left;  . gastric bypass    . left ulnar     . PORT-A-CATH REMOVAL N/A 05/09/2019   Procedure: PORT REMOVAL;  Surgeon: [name], MD;  Location: [place];  Service: General;  Laterality: N/A;  . PORTACATH PLACEMENT Right 09/06/2018   Procedure: INSERTION PORT-A-CATHETER;  Surgeon: [name], MD;  Location: [place];  Service: General;  Laterality: Right;    There were no vitals filed for this visit.    Subjective Assessment - 08/15/19 1304    Subjective I completed physical therapy for cording in May in [place]. Just recently I noticed my wedding rings were not fitting. I can not get them on anymore. It has been since June. I am now 10 lbs lighter than I was before.    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 It is ER/PR positive and HER2 negative with a Ki67 of 50%. She has a plate in her left forearm from surgery in 2000 and had bariatric surgery 16 years ago. She underwent a left lumpectomy and sentinel node biopsy 08/04/2018. She had 3 axillary nodes removed and they were negative.    Patient Stated Goals to check for lymphedema and make sure nothing is going on    Currently in Pain? Yes    Pain Score 2     Pain Location Breast    Pain Orientation Left    Pain Descriptors / Indicators Sore    Pain Type Chronic pain    Pain Onset More than a month ago    Pain Frequency Constant    Aggravating Factors  sleeping with arm up    Pain Relieving Factors stretching    Effect of Pain on Daily Activities none              [facility] PT Assessment - 08/15/19 0001      Assessment   Medical Diagnosis s/p left lumpectomy and SLNB    Referring Provider (PT) Causey    Onset Date/Surgical Date 08/04/18    Hand Dominance Right    Prior Therapy pt for cording in May 2021      Precautions  Precautions Other (comment)    Precaution Comments at risk for lymphedema      Restrictions   Weight Bearing Restrictions No      Balance Screen   Has the patient fallen in the past 6 months No    Has the patient had a decrease in activity level because of a fear of falling?  No    Is the patient reluctant to leave their home because of a fear of falling?  No      Home Environment   Living Environment Private residence    Living Arrangements Spouse/significant other;Children   Husband and 55 y.o. son   Available Help at Discharge Family      Prior Function   Level of [place] Full time employment   goes back to work on Friday   Vocation Requirements Sign language interpreter at [employer] She does not exercise      Cognition   Overall Cognitive Status Within Functional Limits for tasks  assessed      Observation/Other Assessments   Observations left breast looks slightly larger than right, pore size is increased on left side and the is pitting edema at medial breast where you can see where bra was      Posture/Postural Control   Posture/Postural Control Postural limitations    Postural Limitations Rounded Shoulders;Forward head      AROM   Right Shoulder Flexion 163 Degrees    Right Shoulder ABduction 177 Degrees    Right Shoulder Internal Rotation 63 Degrees    Right Shoulder External Rotation 90 Degrees    Left Shoulder Extension --    Left Shoulder Flexion 160 Degrees    Left Shoulder ABduction 156 Degrees    Left Shoulder Internal Rotation 74 Degrees    Left Shoulder External Rotation 90 Degrees             LYMPHEDEMA/ONCOLOGY QUESTIONNAIRE - 08/15/19 0001      Type   Cancer Type Left breast cancer      Surgeries   Lumpectomy Date 08/04/18    Sentinel Lymph Node Biopsy Date 08/04/18    Number Lymph Nodes Removed 3      Treatment   Active Chemotherapy Treatment No   Begins chemo 09/07/2018   Past Chemotherapy Treatment Yes    Active Radiation Treatment No    Past Radiation Treatment Yes    Current Hormone Treatment Yes    Drug Name Tamoxifen    Past Hormone Therapy No      What other symptoms do you have   Are you Having Heaviness or Tightness Yes    Are you having Pain Yes    Are you having pitting edema Yes    Body Site medial left breast    Is it Hard or Difficult finding clothes that fit No    Do you have infections No    Is there Decreased scar mobility No    Stemmer Sign No      Lymphedema Assessments   Lymphedema Assessments Upper extremities      Right Upper Extremity Lymphedema   10 cm Proximal to Olecranon Process 31.8 cm    Olecranon Process 27.5 cm    10 cm Proximal to Ulnar Styloid Process 23 cm    Just Proximal to Ulnar Styloid Process 16.7 cm    Across Hand at [employer] 19.5 cm    At [place] of 2nd Digit 6 cm  Left Upper Extremity Lymphedema   10 cm Proximal to Olecranon Process 34.8 cm    Olecranon Process 27.2 cm    10 cm Proximal to Ulnar Styloid Process 23.3 cm    Just Proximal to Ulnar Styloid Process 17.1 cm    Across Hand at [employer] 18.7 cm    At [place] of 2nd Digit 5.6 cm                   Objective measurements completed on examination: See above findings.                    PT Long Term Goals - 08/15/19 1350      PT LONG TERM GOAL #1   Title Pt will be instructed in lymphedema risk reduction practices.    Status Achieved                  Plan - 08/15/19 1345    Clinical Impression Statement Pt s/p left lumpectomy and SLNB on 08/04/18. She began noticing her rings did not fit in June of 2021. She has completed chemotherapy and radiation. She received PT for cording in May 2021. The cording is mostly resolved except for 1 cords. She presents with decreased left shoulder ROM and has pain in the left axilla due to tightness. She also reports the left breast has indentions when she removes her bra. Pore size is visibly larger on left when compared to right and swelling is evident in left breast. Her upper arm on the left side measured 3 cm larger than the right but all other measurements were less than a 2 cm difference. Issued a signed script for pt to obtain a compression sleeve, glove and compression bra for management of lymphedema. Educated pt about lymphedema and lymphedema risk reduction practices. Pt would benefit from skilled PT services to address this but she lives in [place] and her work schedule does not allow her enough time to drive to this clinic. She is going to transfer her care to a clinic in [place] that is right beside her work.    Stability/Clinical Decision Making Stable/Uncomplicated    Rehab Potential Excellent    PT Frequency One time visit    PT Treatment/Interventions ADLs/Self Care Home Management;Therapeutic  exercise;Patient/family education    PT Next Visit Plan d/c- pt to transfer care to [facility]    Recommended Other Services issued signed script for pt to obtain a compression sleeve, glove and bra    Consulted and Agree with Plan of Care Patient           Patient will benefit from skilled therapeutic intervention in order to improve the following deficits and impairments:  Pain, Impaired UE functional use, Decreased knowledge of precautions, Postural dysfunction, Decreased range of motion, Increased edema, Increased fascial restricitons  Visit Diagnosis: Lymphedema, not elsewhere classified  Stiffness of left shoulder, not elsewhere classified  Abnormal posture     Problem List Patient Active Problem List   Diagnosis Date Noted  . Port-A-Cath in place 10/18/2018  . Genetic testing 07/19/2018  . Family history of ovarian cancer   . Family history of stomach cancer   . Malignant neoplasm of upper-inner quadrant of left breast in female, estrogen receptor positive ([place]) 07/05/2018    Caitlin Graham [facility] 08/15/2019, 1:51 PM  [place], [place], [address] Phone: [phone]   Fax:  [phone]  Name: Caitlin Graham MRN: [id number]  Date of Birth: 08/06/1965  Caitlin Graham [place], PT 08/15/19 1:51 PM

## 2019-08-15 NOTE — Telephone Encounter (Signed)
Referral faxed to [facility]. Pt lives closer to that facility. Has been seen there previously.

## 2019-08-17 ENCOUNTER — Other Ambulatory Visit: Payer: Self-pay | Admitting: Adult Health

## 2019-08-17 DIAGNOSIS — E559 Vitamin D deficiency, unspecified: Secondary | ICD-10-CM

## 2019-08-17 MED ORDER — ERGOCALCIFEROL 1.25 MG (50000 UT) PO CAPS: 50000.0000 [IU] | ORAL_CAPSULE | ORAL | 0 refills | Status: AC

## 2019-08-17 NOTE — Progress Notes (Signed)
Ferritin is 9, b12 level low also.  This is secondary to bariatric surgery.  Called and reviewed with patient.  Recommended IV Iron and b12 injection.  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  She can do IV iron next two fridays.  Will order and wait on insurance.  Wilber Bihari, NP

## 2019-08-22 ENCOUNTER — Ambulatory Visit: Payer: BC Managed Care – PPO | Admitting: Physical Therapy

## 2019-08-30 ENCOUNTER — Encounter: Payer: Self-pay | Admitting: Adult Health

## 2019-08-31 ENCOUNTER — Other Ambulatory Visit: Payer: Self-pay | Admitting: Adult Health

## 2019-09-01 ENCOUNTER — Telehealth: Payer: Self-pay | Admitting: Hematology and Oncology

## 2019-09-01 ENCOUNTER — Encounter: Payer: Self-pay | Admitting: Hematology and Oncology

## 2019-09-01 NOTE — Telephone Encounter (Signed)
Scheduled appt per 8/25 sch msg - left message for patient with appt date and time

## 2019-09-02 ENCOUNTER — Ambulatory Visit: Payer: BC Managed Care – PPO

## 2019-09-05 ENCOUNTER — Other Ambulatory Visit: Payer: Self-pay | Admitting: Hematology and Oncology

## 2019-09-05 MED ORDER — CYANOCOBALAMIN 1000 MCG/ML IJ SOLN: 1000.0000 ug | Freq: Once | INTRAMUSCULAR | 3 refills | Status: AC

## 2019-09-05 NOTE — Progress Notes (Signed)
Patient wants to do B12 injections at home. I sent a prescription to her pharmacy.

## 2019-09-06 ENCOUNTER — Other Ambulatory Visit: Payer: Self-pay | Admitting: *Deleted

## 2019-09-06 MED ORDER — "SYRINGE/NEEDLE (DISP) 23G X 1"" 1 ML MISC": 1.0000 | 0 refills | Status: AC

## 2019-09-06 MED ORDER — CYANOCOBALAMIN 1000 MCG/ML IJ SOLN: 1000.0000 ug | INTRAMUSCULAR | 12 refills | Status: AC

## 2019-09-06 NOTE — Progress Notes (Signed)
Pt requesting to administer Vitamin B12 injection at home.  Pt states she has a family friend that is a Marine scientist and will be able to help administer it.  Pt will receive first two doses of Vitamin B12 in the clinic with her IV iron infusion.  Post IV iron infusions pt educated to administer Vitamin B12 injection weekly x2 weeks and then every 4 weeks x 12 weeks.  Pt verbalized understanding. Prescription sent to pharmacy on file.

## 2019-09-09 ENCOUNTER — Ambulatory Visit: Payer: BC Managed Care – PPO

## 2019-09-09 ENCOUNTER — Other Ambulatory Visit: Payer: Self-pay

## 2019-09-09 ENCOUNTER — Inpatient Hospital Stay: Payer: BC Managed Care – PPO | Attending: Adult Health

## 2019-09-09 VITALS — BP 113/70 | HR 63 | Temp 98.7°F | Resp 18

## 2019-09-09 DIAGNOSIS — E611 Iron deficiency: Secondary | ICD-10-CM | POA: Diagnosis not present

## 2019-09-09 DIAGNOSIS — Z95828 Presence of other vascular implants and grafts: Secondary | ICD-10-CM

## 2019-09-09 DIAGNOSIS — E538 Deficiency of other specified B group vitamins: Secondary | ICD-10-CM | POA: Diagnosis present

## 2019-09-09 DIAGNOSIS — D509 Iron deficiency anemia, unspecified: Secondary | ICD-10-CM

## 2019-09-09 MED ORDER — SODIUM CHLORIDE 0.9 % IV SOLN
INTRAVENOUS | Status: DC
  Filled 2019-09-09: qty 250

## 2019-09-09 MED ORDER — CYANOCOBALAMIN 1000 MCG/ML IJ SOLN
INTRAMUSCULAR | Status: AC
  Filled 2019-09-09: qty 1

## 2019-09-09 MED ORDER — CYANOCOBALAMIN 1000 MCG/ML IJ SOLN
1000.0000 ug | Freq: Once | INTRAMUSCULAR | Status: AC
  Administered 2019-09-09: 1000 ug via INTRAMUSCULAR

## 2019-09-09 MED ORDER — SODIUM CHLORIDE 0.9 % IV SOLN
510.0000 mg | Freq: Once | INTRAVENOUS | Status: AC
  Administered 2019-09-09: 510 mg via INTRAVENOUS
  Filled 2019-09-09: qty 510

## 2019-09-09 NOTE — Patient Instructions (Signed)
Ferumoxytol injection What is this medicine? FERUMOXYTOL is an iron complex. Iron is used to make healthy red blood cells, which carry oxygen and nutrients throughout the body. This medicine is used to treat iron deficiency anemia. This medicine may be used for other purposes; ask your health care provider or pharmacist if you have questions. COMMON BRAND NAME(S): Feraheme What should I tell my health care provider before I take this medicine? They need to know if you have any of these conditions:  anemia not caused by low iron levels  high levels of iron in the blood  magnetic resonance imaging (MRI) test scheduled  an unusual or allergic reaction to iron, other medicines, foods, dyes, or preservatives  pregnant or trying to get pregnant  breast-feeding How should I use this medicine? This medicine is for injection into a vein. It is given by a health care professional in a hospital or clinic setting. Talk to your pediatrician regarding the use of this medicine in children. Special care may be needed. Overdosage: If you think you have taken too much of this medicine contact a poison control center or emergency room at once. NOTE: This medicine is only for you. Do not share this medicine with others. What if I miss a dose? It is important not to miss your dose. Call your doctor or health care professional if you are unable to keep an appointment. What may interact with this medicine? This medicine may interact with the following medications:  other iron products This list may not describe all possible interactions. Give your health care provider a list of all the medicines, herbs, non-prescription drugs, or dietary supplements you use. Also tell them if you smoke, drink alcohol, or use illegal drugs. Some items may interact with your medicine. What should I watch for while using this medicine? Visit your doctor or healthcare professional regularly. Tell your doctor or healthcare  professional if your symptoms do not start to get better or if they get worse. You may need blood work done while you are taking this medicine. You may need to follow a special diet. Talk to your doctor. Foods that contain iron include: whole grains/cereals, dried fruits, beans, or peas, leafy green vegetables, and organ meats (liver, kidney). What side effects may I notice from receiving this medicine? Side effects that you should report to your doctor or health care professional as soon as possible:  allergic reactions like skin rash, itching or hives, swelling of the face, lips, or tongue  breathing problems  changes in blood pressure  feeling faint or lightheaded, falls  fever or chills  flushing, sweating, or hot feelings  swelling of the ankles or feet Side effects that usually do not require medical attention (report to your doctor or health care professional if they continue or are bothersome):  diarrhea  headache  nausea, vomiting  stomach pain This list may not describe all possible side effects. Call your doctor for medical advice about side effects. You may report side effects to FDA at 1-800-FDA-1088. Where should I keep my medicine? This drug is given in a hospital or clinic and will not be stored at home. NOTE: This sheet is a summary. It may not cover all possible information. If you have questions about this medicine, talk to your doctor, pharmacist, or health care provider.  2020 Elsevier/Gold Standard (2016-02-11 20:21:10)  Cyanocobalamin, Vitamin B12 injection What is this medicine? CYANOCOBALAMIN (sye an oh koe BAL a min) is a man made form of vitamin   B12. Vitamin B12 is used in the growth of healthy blood cells, nerve cells, and proteins in the body. It also helps with the metabolism of fats and carbohydrates. This medicine is used to treat people who can not absorb vitamin B12. This medicine may be used for other purposes; ask your health care provider or  pharmacist if you have questions. COMMON BRAND NAME(S): B-12 Compliance Kit, B-12 Injection Kit, Cyomin, LA-12, Nutri-Twelve, Physicians EZ Use B-12, Primabalt What should I tell my health care provider before I take this medicine? They need to know if you have any of these conditions:  kidney disease  Leber's disease  megaloblastic anemia  an unusual or allergic reaction to cyanocobalamin, cobalt, other medicines, foods, dyes, or preservatives  pregnant or trying to get pregnant  breast-feeding How should I use this medicine? This medicine is injected into a muscle or deeply under the skin. It is usually given by a health care professional in a clinic or doctor's office. However, your doctor may teach you how to inject yourself. Follow all instructions. Talk to your pediatrician regarding the use of this medicine in children. Special care may be needed. Overdosage: If you think you have taken too much of this medicine contact a poison control center or emergency room at once. NOTE: This medicine is only for you. Do not share this medicine with others. What if I miss a dose? If you are given your dose at a clinic or doctor's office, call to reschedule your appointment. If you give your own injections and you miss a dose, take it as soon as you can. If it is almost time for your next dose, take only that dose. Do not take double or extra doses. What may interact with this medicine?  colchicine  heavy alcohol intake This list may not describe all possible interactions. Give your health care provider a list of all the medicines, herbs, non-prescription drugs, or dietary supplements you use. Also tell them if you smoke, drink alcohol, or use illegal drugs. Some items may interact with your medicine. What should I watch for while using this medicine? Visit your doctor or health care professional regularly. You may need blood work done while you are taking this medicine. You may need to  follow a special diet. Talk to your doctor. Limit your alcohol intake and avoid smoking to get the best benefit. What side effects may I notice from receiving this medicine? Side effects that you should report to your doctor or health care professional as soon as possible:  allergic reactions like skin rash, itching or hives, swelling of the face, lips, or tongue  blue tint to skin  chest tightness, pain  difficulty breathing, wheezing  dizziness  red, swollen painful area on the leg Side effects that usually do not require medical attention (report to your doctor or health care professional if they continue or are bothersome):  diarrhea  headache This list may not describe all possible side effects. Call your doctor for medical advice about side effects. You may report side effects to FDA at 1-800-FDA-1088. Where should I keep my medicine? Keep out of the reach of children. Store at room temperature between 15 and 30 degrees C (59 and 85 degrees F). Protect from light. Throw away any unused medicine after the expiration date. NOTE: This sheet is a summary. It may not cover all possible information. If you have questions about this medicine, talk to your doctor, pharmacist, or health care provider.  2020 Elsevier/Gold  Standard (2007-04-05 22:10:20)

## 2019-09-16 ENCOUNTER — Other Ambulatory Visit: Payer: Self-pay

## 2019-09-16 ENCOUNTER — Inpatient Hospital Stay: Payer: BC Managed Care – PPO

## 2019-09-16 ENCOUNTER — Ambulatory Visit: Payer: BC Managed Care – PPO

## 2019-09-16 VITALS — BP 117/64 | HR 64 | Temp 97.9°F | Resp 18

## 2019-09-16 DIAGNOSIS — E611 Iron deficiency: Secondary | ICD-10-CM | POA: Diagnosis not present

## 2019-09-16 DIAGNOSIS — Z95828 Presence of other vascular implants and grafts: Secondary | ICD-10-CM

## 2019-09-16 MED ORDER — CYANOCOBALAMIN 1000 MCG/ML IJ SOLN
INTRAMUSCULAR | Status: AC
  Filled 2019-09-16: qty 1

## 2019-09-16 MED ORDER — CYANOCOBALAMIN 1000 MCG/ML IJ SOLN
1000.0000 ug | Freq: Once | INTRAMUSCULAR | Status: AC
  Administered 2019-09-16: 1000 ug via INTRAMUSCULAR

## 2019-09-16 MED ORDER — SODIUM CHLORIDE 0.9 % IV SOLN
INTRAVENOUS | Status: DC
  Filled 2019-09-16: qty 250

## 2019-09-16 MED ORDER — SODIUM CHLORIDE 0.9 % IV SOLN
510.0000 mg | Freq: Once | INTRAVENOUS | Status: AC
  Administered 2019-09-16: 510 mg via INTRAVENOUS
  Filled 2019-09-16: qty 510

## 2019-09-16 NOTE — Patient Instructions (Signed)

## 2019-09-19 ENCOUNTER — Encounter: Payer: Self-pay | Admitting: *Deleted

## 2019-09-19 NOTE — Progress Notes (Signed)
RN successfully faxed office note that addresses pt lymphedema from Caitlin Bihari, NP on 08/12/2019 to pt insurance company [phone]) for coverage of lymphedema pump.

## 2019-09-23 ENCOUNTER — Ambulatory Visit: Payer: BC Managed Care – PPO

## 2019-10-05 NOTE — Progress Notes (Signed)
This nurse spoke with Caitlin Graham at [facility] [phone] to inform her that signed letter of medical necessity has been signed and faxed to [phone]. Caitlin Graham states she will call back if she does not receive, as this is the third attempt to send.

## 2019-10-17 ENCOUNTER — Ambulatory Visit
Admission: RE | Admit: 2019-10-17 | Discharge: 2019-10-17 | Disposition: A | Discharge status: Home / Self Care | Payer: BC Managed Care – PPO | Source: Ambulatory Visit | Attending: Adult Health | Admitting: Adult Health

## 2019-10-17 ENCOUNTER — Other Ambulatory Visit: Payer: Self-pay

## 2019-10-17 DIAGNOSIS — C50212 Malignant neoplasm of upper-inner quadrant of left female breast: Secondary | ICD-10-CM

## 2019-10-17 DIAGNOSIS — Z170 Estrogen receptor positive status [ER+]: Secondary | ICD-10-CM

## 2019-10-17 HISTORY — DX: Personal history of irradiation: Z92.3

## 2019-10-17 HISTORY — DX: Personal history of antineoplastic chemotherapy: Z92.21

## 2019-10-21 ENCOUNTER — Ambulatory Visit: Payer: BC Managed Care – PPO

## 2019-10-24 NOTE — Progress Notes (Signed)
Patient Care Team: Charyl Bigger, MD as PCP - General (Obstetrics and Gynecology) [name], MD as Consulting Physician (Hematology and Oncology) [name], MD as Consulting Physician (General Surgery) [name], MD as Attending Physician (Radiation Oncology)  DIAGNOSIS:    ICD-10-CM   1. Malignant neoplasm of upper-inner quadrant of left breast in female, estrogen receptor positive ([place])  C50.212    Z17.0     SUMMARY OF ONCOLOGIC HISTORY: Oncology History  Malignant neoplasm of upper-inner quadrant of left breast in female, estrogen receptor positive ([place])  07/05/2018 Initial Diagnosis   Screening mammogram detected bilateral breast masses. Diagnostic mammogram and US showed cysts in the right breast and a 1.2cm left breast mass at the 9 o'clock position. 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    07/07/2018 Cancer Staging   Staging form: Breast, AJCC 8th Edition - Clinical stage from 07/07/2018: Stage IA (cT1c, cN0, cM0, G2, ER+, PR+, HER2-)   07/18/2018 Genetic Testing   Negative testing. No pathogenic variants identified on the [facility] Common Hereditary Cancers Panel. The Common Hereditary Cancers Panel offered by [facility] includes sequencing and/or deletion duplication testing of the following 47 genes: APC, ATM, AXIN2, BARD1, BMPR1A, BRCA1, BRCA2, BRIP1, CDH1, CDKN2A (p14ARF), CDKN2A (p16INK4a), CKD4, CHEK2, CTNNA1, DICER1, EPCAM (Deletion/duplication testing only), GREM1 (promoter region deletion/duplication testing only), KIT, MEN1, MLH1, MSH2, MSH3, MSH6, MUTYH, NBN, NF1, NHTL1, PALB2, PDGFRA, PMS2, POLD1, POLE, PTEN, RAD50, RAD51C, RAD51D, SDHB, SDHC, SDHD, SMAD4, SMARCA4. STK11, TP53, TSC1, TSC2, and VHL.  The following genes were evaluated for sequence changes only: SDHA and HOXB13 c.251G>A variant only. The report date is 07/18/2018.   08/04/2018 Surgery   Left lumpectomy [name]) [phone]): IDC with intermediate grade DCIS,  1.2cm, grade 1, clear margins, with 3 axillary lymph nodes negative for carcinoma ER 100%, PR 60%, Ki-67 50%   08/04/2018 Oncotype testing   The Oncotype DX score was 32 predicting a risk of outside the breast recurrence over the next 9 years of 20% if the patient's only systemic therapy is tamoxifen for 5 years.    09/07/2018 - 02/14/2019 Chemotherapy   dexamethasone (DECADRON) 4 MG tablet, 1 of 1 cycle, Start date: 08/25/2018, End date: 11/01/2018  DOXOrubicin (ADRIAMYCIN) chemo injection 146 mg, 60 mg/m2 = 146 mg, Intravenous,  Once, 4 of 4 cycles. Administration: 146 mg (09/07/2018), 146 mg (09/20/2018), 146 mg (10/04/2018), 146 mg (10/18/2018)  palonosetron (ALOXI) injection 0.25 mg, 0.25 mg, Intravenous,  Once, 4 of 4 cycles. Administration: 0.25 mg (09/07/2018), 0.25 mg (09/20/2018), 0.25 mg (10/04/2018), 0.25 mg (10/18/2018)  pegfilgrastim-jmdb (FULPHILA) injection 6 mg, 6 mg, Subcutaneous,  Once, 4 of 4 cycles. Administration: 6 mg (09/09/2018), 6 mg (09/22/2018), 6 mg (10/06/2018), 6 mg (10/20/2018)  cyclophosphamide (CYTOXAN) 1,460 mg in sodium chloride 0.9 % 250 mL chemo infusion, 600 mg/m2 = 1,460 mg, Intravenous,  Once, 4 of 4 cycles. Administration: 1,460 mg (09/07/2018), 1,460 mg (09/20/2018), 1,460 mg (10/04/2018), 1,460 mg (10/18/2018)  PACLitaxel (TAXOL) 198 mg in sodium chloride 0.9 % 250 mL chemo infusion (</= 54m/m2), 80 mg/m2 = 198 mg, Intravenous,  Once, 12 of 12 cycles. Dose modification: 65 mg/m2 (original dose 80 mg/m2, Cycle 9, Reason: Provider Judgment, Comment: leukopenia), 50 mg/m2 (original dose 80 mg/m2, Cycle 14, Reason: Dose not tolerated). Administration: 198 mg (11/01/2018), 198 mg (12/06/2018), 198 mg (12/13/2018), 156 mg (12/20/2018), 156 mg (12/27/2018), 156 mg (01/03/2019), 156 mg (01/10/2019), 156 mg (01/17/2019), 156 mg (01/24/2019), 120 mg (01/31/2019), 120 mg (  02/07/2019), 120 mg (02/14/2019)  fosaprepitant (EMEND) 150 mg  dexamethasone (DECADRON) 12 mg in sodium chloride 0.9 % 145  mL IVPB, , Intravenous,  Once, 4 of 4 cycles. Administration:  (09/07/2018),  (09/20/2018),  (10/04/2018),  (10/18/2018)   03/2019 -  Radiation Therapy   Radiation at [facility]   04/2019 -  Anti-estrogen oral therapy   Tamoxifen     CHIEF COMPLIANT: Follow-up of left breast cancer on tamoxifen  INTERVAL HISTORY: Caitlin Graham is a 54 y.o. with above-mentioned history of left breast cancer who underwent a lumpectomy,adjuvant chemotherapy, radiation, and is currently on antiestrogen therapy with tamoxifen. Mammogram on 10/17/19 showed no evidence of malignancy bilaterally. She presents to the clinic today for follow-up. Her major complaint today is obesity and profound weight gain in spite of eating small amounts of food. She is also complaining of hot flashes and mood swings from tamoxifen therapy. She developed lymphedema of the breast and she is undergoing physical therapy. She received IV iron and seems to have improved slightly with her energy levels.  Although she still feels extremely tired.  She is giving herself B12 injections once a month.  ALLERGIES:  is allergic to aspirin, acetaminophen-codeine, ibuprofen, hydrocodone, oxycodone, and phenergan [promethazine].  MEDICATIONS:  Current Outpatient Medications  Medication Sig Dispense Refill  . acetaminophen (TYLENOL) 325 MG tablet Take 650 mg by mouth every 6 (six) hours as needed.    . clindamycin (CLINDAGEL) 1 % gel Apply topically 2 (two) times daily. 30 g 1  . cyanocobalamin (,VITAMIN B-12,) 1000 MCG/ML injection Inject 1 mL (1,000 mcg total) into the muscle every 30 (thirty) days. 1 mL 12  . ergocalciferol (VITAMIN D2) 1.25 MG (50000 UT) capsule Take 1 capsule (50,000 Units total) by mouth once a week. 12 capsule 0  . SYRINGE/NEEDLE, DISP, 1 ML 23G X 1" 1 ML MISC 1 Syringe by Does not apply route every 30 (thirty) days. Patient to use with administration of Vitamin B12 25 each 0  . tamoxifen (NOLVADEX) 20 MG tablet Take 1 tablet (20 mg  total) by mouth daily. 90 tablet 3  . traMADol (ULTRAM) 50 MG tablet Take 1 tablet (50 mg total) by mouth every 6 (six) hours as needed. 30 tablet 0   No current facility-administered medications for this visit.    PHYSICAL EXAMINATION: ECOG PERFORMANCE STATUS: 1 - Symptomatic but completely ambulatory  There were no vitals filed for this visit. There were no vitals filed for this visit.  BREAST: No palpable masses or nodules in either right or left breasts. No palpable axillary supraclavicular or infraclavicular adenopathy no breast tenderness or nipple discharge. (exam performed in the presence of a chaperone)  LABORATORY DATA:  I have reviewed the data as listed CMP Latest Ref Rng & Units 08/15/2019 02/14/2019 02/07/2019  Glucose 70 - 99 mg/dL 94 73 95  BUN 6 - 20 mg/dL _0 Creatinine 0.44 - 1.00 mg/dL 0.90 0.69 0.71  Sodium 135 - 145 mmol/L 141 143 140  Potassium 3.5 - 5.1 mmol/L 4.2 4.2 4.3  Chloride 98 - 111 mmol/L 109 109 107  CO2 22 - 32 mmol/L _1 Calcium 8.9 - 10.3 mg/dL 9.3 8.7(L) 8.8(L)  Total Protein 6.5 - 8.1 g/dL 7.1 6.3(L) 6.5  Total Bilirubin 0.3 - 1.2 mg/dL 0.4 0.4 0.4  Alkaline Phos 38 - 126 U/L 73 80 83  AST 15 - 41 U/L 10(L) 12(L) 12(L)  ALT 0 - 44 U/L 12 18 17  Lab Results  Component Value Date   WBC 8.0 08/15/2019   HGB 12.1 08/15/2019   HCT 38.5 08/15/2019   MCV 90.0 08/15/2019   PLT 230 08/15/2019   NEUTROABS 4.7 08/15/2019    ASSESSMENT & PLAN:  Malignant neoplasm of upper-inner quadrant of left breast in female, estrogen receptor positive ([place]) 07/05/2018:Screening mammogram detected bilateral breast masses. Diagnostic mammogram and US showed cysts in the right breast and a 1.2cm left breast mass at the 9 o'clock position. 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 Stage Ia Brief course of neoadjuvant tamoxifen therapy started 07/07/2018 08/04/2018: Left lumpectomy:Left lumpectomy [name]): IDC with  intermediate grade DCIS, 1.2cm, grade 1, clear margins, with 3 axillary lymph nodes negative for carcinoma ER 100%, PR 60%, Ki-67 50%  Oncotype DX score: 32, distant recurrence at 9 years with hormone therapy alone: 20%  Treatment plan: 1.Adjuvant chemotherapy with dose dense Adriamycin and Cytoxan x4 followed by Taxol x12 completed 02/14/2019 2.radiation therapy at [facility] started March 2021 3.Adjuvant antiestrogen therapy with tamoxifen ----------------------------------------------------------------------------------------------------------------------------- Current treatment: Tamoxifen 20 mg daily resumed 04/11/2019 (patient took tamoxifen prior to chemo) Tamoxifen toxicities: 1.  Moderate hot flashes 2. mood swings 3.  Weight gain  Breast cancer surveillance: 1.  Mammogram 10/17/2019: Benign breast density category B 2. 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  Will obtain labs today.  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  Her original surgery happened in [place].  Return to clinic in 3 months with labs and follow-up     No orders of the defined types were placed in this encounter.  The patient has a good understanding of the overall plan. she agrees with it. she will call with any problems that may develop before the next visit here.  Total time spent: 20 mins including face to face time and time spent for planning, charting and coordination of care  [name], MD 10/25/2019  I, Cloyde Reams Dorshimer, am acting as scribe for Dr. [name].  I have  reviewed the above documentation for accuracy and completeness, and I agree with the above.

## 2019-10-25 ENCOUNTER — Other Ambulatory Visit: Payer: Self-pay

## 2019-10-25 ENCOUNTER — Inpatient Hospital Stay: Payer: BC Managed Care – PPO

## 2019-10-25 ENCOUNTER — Inpatient Hospital Stay: Payer: BC Managed Care – PPO | Attending: Adult Health | Admitting: Hematology and Oncology

## 2019-10-25 ENCOUNTER — Inpatient Hospital Stay: Payer: BC Managed Care – PPO | Admitting: Hematology and Oncology

## 2019-10-25 VITALS — BP 103/70 | HR 64 | Temp 97.6°F | Resp 18 | Ht 65.5 in | Wt 276.9 lb

## 2019-10-25 DIAGNOSIS — D509 Iron deficiency anemia, unspecified: Secondary | ICD-10-CM | POA: Diagnosis not present

## 2019-10-25 DIAGNOSIS — E538 Deficiency of other specified B group vitamins: Secondary | ICD-10-CM | POA: Diagnosis not present

## 2019-10-25 DIAGNOSIS — Z9221 Personal history of antineoplastic chemotherapy: Secondary | ICD-10-CM | POA: Diagnosis not present

## 2019-10-25 DIAGNOSIS — E611 Iron deficiency: Secondary | ICD-10-CM | POA: Insufficient documentation

## 2019-10-25 DIAGNOSIS — Z923 Personal history of irradiation: Secondary | ICD-10-CM | POA: Insufficient documentation

## 2019-10-25 DIAGNOSIS — C50212 Malignant neoplasm of upper-inner quadrant of left female breast: Secondary | ICD-10-CM

## 2019-10-25 DIAGNOSIS — Z79810 Long term (current) use of selective estrogen receptor modulators (SERMs): Secondary | ICD-10-CM | POA: Diagnosis not present

## 2019-10-25 DIAGNOSIS — Z170 Estrogen receptor positive status [ER+]: Secondary | ICD-10-CM

## 2019-10-25 LAB — CBC WITH DIFFERENTIAL (CANCER CENTER ONLY)
Abs Immature Granulocytes: 0.04 10*3/uL (ref 0.00–0.07)
Basophils Absolute: 0.1 10*3/uL (ref 0.0–0.1)
Basophils Relative: 1 %
Eosinophils Absolute: 0.1 10*3/uL (ref 0.0–0.5)
Eosinophils Relative: 2 %
HCT: 42.1 % (ref 36.0–46.0)
Hemoglobin: 13.6 g/dL (ref 12.0–15.0)
Immature Granulocytes: 1 %
Lymphocytes Relative: 28 %
Lymphs Abs: 2 10*3/uL (ref 0.7–4.0)
MCH: 30.9 pg (ref 26.0–34.0)
MCHC: 32.3 g/dL (ref 30.0–36.0)
MCV: 95.7 fL (ref 80.0–100.0)
Monocytes Absolute: 0.7 10*3/uL (ref 0.1–1.0)
Monocytes Relative: 10 %
Neutro Abs: 4.1 10*3/uL (ref 1.7–7.7)
Neutrophils Relative %: 58 %
Platelet Count: 189 10*3/uL (ref 150–400)
RBC: 4.4 MIL/uL (ref 3.87–5.11)
RDW: 15.9 % — ABNORMAL HIGH (ref 11.5–15.5)
WBC Count: 7 10*3/uL (ref 4.0–10.5)
nRBC: 0 % (ref 0.0–0.2)

## 2019-10-25 LAB — CMP (CANCER CENTER ONLY)
ALT: 14 U/L (ref 0–44)
AST: 12 U/L — ABNORMAL LOW (ref 15–41)
Albumin: 3.5 g/dL (ref 3.5–5.0)
Alkaline Phosphatase: 72 U/L (ref 38–126)
Anion gap: 8 (ref 5–15)
BUN: 12 mg/dL (ref 6–20)
CO2: 27 mmol/L (ref 22–32)
Calcium: 9.3 mg/dL (ref 8.9–10.3)
Chloride: 105 mmol/L (ref 98–111)
Creatinine: 0.71 mg/dL (ref 0.44–1.00)
GFR, Estimated: >60 mL/min (ref >60)
Glucose, Bld: 91 mg/dL (ref 70–99)
Potassium: 4 mmol/L (ref 3.5–5.1)
Sodium: 140 mmol/L (ref 135–145)
Total Bilirubin: 0.3 mg/dL (ref 0.3–1.2)
Total Protein: 7 g/dL (ref 6.5–8.1)

## 2019-10-25 LAB — IRON AND TIBC
Iron: 114 ug/dL (ref 41–142)
Saturation Ratios: 38 % (ref 21–57)
TIBC: 301 ug/dL (ref 236–444)
UIBC: 187 ug/dL (ref 120–384)

## 2019-10-25 LAB — FERRITIN: Ferritin: 182 ng/mL (ref 11–307)

## 2019-10-25 NOTE — Assessment & Plan Note (Addendum)
07/05/2018:Screening mammogram detected bilateral breast masses. Diagnostic mammogram and US showed cysts in the right breast and a 1.2cm left breast mass at the 9 o'clock position. 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 Stage Ia Brief course of neoadjuvant tamoxifen therapy started 07/07/2018 08/04/2018: Left lumpectomy:Left lumpectomy Caitlin Graham): IDC with intermediate grade DCIS, 1.2cm, grade 1, clear margins, with 3 axillary lymph nodes negative for carcinoma ER 100%, PR 60%, Ki-67 50%  Oncotype DX score: 32, distant recurrence at 9 years with hormone therapy alone: 20%  Treatment plan: 1.Adjuvant chemotherapy with dose dense Adriamycin and Cytoxan x4 followed by Taxol x12 completed 02/14/2019 2.radiation therapy at [facility] started March 2021 3.Adjuvant antiestrogen therapy with tamoxifen since she has not had menstrual cycle since chemotherapy ----------------------------------------------------------------------------------------------------------------------------- Current treatment: Tamoxifen 20 mg daily resumed 04/11/2019 (patient took tamoxifen prior to chemo) Tamoxifen toxicities:  Breast cancer surveillance: 1.  Mammogram 10/17/2019: Benign breast density category B 2. 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  Lab review:  Return to clinic in 3 months with labs done ahead of time and follow-up

## 2019-11-18 ENCOUNTER — Ambulatory Visit: Payer: BC Managed Care – PPO

## 2019-12-16 ENCOUNTER — Ambulatory Visit: Payer: BC Managed Care – PPO

## 2020-01-13 ENCOUNTER — Ambulatory Visit: Payer: BC Managed Care – PPO

## 2020-01-23 ENCOUNTER — Telehealth: Payer: Self-pay | Admitting: Hematology and Oncology

## 2020-01-23 NOTE — Assessment & Plan Note (Deleted)
07/05/2018:Screening mammogram detected bilateral breast masses. Diagnostic mammogram and US showed cysts in the right breast and a 1.2cm left breast mass at the 9 o'clock position. 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 Stage Ia Brief course of neoadjuvant tamoxifen therapy started 07/07/2018 08/04/2018: Left lumpectomy:Left lumpectomy Caitlin Graham): IDC with intermediate grade DCIS, 1.2cm, grade 1, clear margins, with 3 axillary lymph nodes negative for carcinoma ER 100%, PR 60%, Ki-67 50%  Oncotype DX score: 32, distant recurrence at 9 years with hormone therapy alone: 20%  Treatment plan: 1.Adjuvant chemotherapy with dose dense Adriamycin and Cytoxan x4 followed by Taxol x12completed 02/14/2019 2.radiation therapyat [facility] started March 2021 3.Adjuvant antiestrogen therapywith tamoxifen ----------------------------------------------------------------------------------------------------------------------------- Current treatment: Tamoxifen 20 mg daily resumed 04/11/2019 (patient took tamoxifen prior to chemo) Tamoxifen toxicities: 1.  Moderate hot flashes 2. mood swings 3.  Weight gain  Breast cancer surveillance: 1.  Mammogram 10/17/2019: Benign breast density category B 2. 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 Recheck labs

## 2020-01-23 NOTE — Telephone Encounter (Signed)
Changed 1/18 appt time, called and spoke with pt, confirmed new appt times

## 2020-01-23 NOTE — Progress Notes (Signed)
Patient Care Team: Charyl Bigger, MD as PCP - General (Obstetrics and Gynecology) [name], MD as Consulting Physician (Hematology and Oncology) [name], MD as Consulting Physician (General Surgery) [name], MD as Attending Physician (Radiation Oncology)  DIAGNOSIS:    ICD-10-CM   1. Iron deficiency anemia, unspecified iron deficiency anemia type  D50.9 Ferritin    Iron and TIBC  2. Malignant neoplasm of upper-inner quadrant of left breast in female, estrogen receptor positive ([place])  C50.212 CBC with Differential ([facility] Only)   Z17.0 Ferritin    Iron and TIBC    Vitamin B12    SUMMARY OF ONCOLOGIC HISTORY: Oncology History  Malignant neoplasm of upper-inner quadrant of left breast in female, estrogen receptor positive ([place])  07/05/2018 Initial Diagnosis   Screening mammogram detected bilateral breast masses. Diagnostic mammogram and US showed cysts in the right breast and a 1.2cm left breast mass at the 9 o'clock position. 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    07/07/2018 Cancer Staging   Staging form: Breast, AJCC 8th Edition - Clinical stage from 07/07/2018: Stage IA (cT1c, cN0, cM0, G2, ER+, PR+, HER2-)   07/18/2018 Genetic Testing   Negative testing. No pathogenic variants identified on the [facility] Common Hereditary Cancers Panel. The Common Hereditary Cancers Panel offered by [facility] includes sequencing and/or deletion duplication testing of the following 47 genes: APC, ATM, AXIN2, BARD1, BMPR1A, BRCA1, BRCA2, BRIP1, CDH1, CDKN2A (p14ARF), CDKN2A (p16INK4a), CKD4, CHEK2, CTNNA1, DICER1, EPCAM (Deletion/duplication testing only), GREM1 (promoter region deletion/duplication testing only), KIT, MEN1, MLH1, MSH2, MSH3, MSH6, MUTYH, NBN, NF1, NHTL1, PALB2, PDGFRA, PMS2, POLD1, POLE, PTEN, RAD50, RAD51C, RAD51D, SDHB, SDHC, SDHD, SMAD4, SMARCA4. STK11, TP53, TSC1, TSC2, and VHL.  The following genes were evaluated for  sequence changes only: SDHA and HOXB13 c.251G>A variant only. The report date is 07/18/2018.   08/04/2018 Surgery   Left lumpectomy [name]) [phone]): IDC with intermediate grade DCIS, 1.2cm, grade 1, clear margins, with 3 axillary lymph nodes negative for carcinoma ER 100%, PR 60%, Ki-67 50%   08/04/2018 Oncotype testing   The Oncotype DX score was 32 predicting a risk of outside the breast recurrence over the next 9 years of 20% if the patient's only systemic therapy is tamoxifen for 5 years.    09/07/2018 - 02/14/2019 Chemotherapy   dexamethasone (DECADRON) 4 MG tablet, 1 of 1 cycle, Start date: 08/25/2018, End date: 11/01/2018  DOXOrubicin (ADRIAMYCIN) chemo injection 146 mg, 60 mg/m2 = 146 mg, Intravenous,  Once, 4 of 4 cycles. Administration: 146 mg (09/07/2018), 146 mg (09/20/2018), 146 mg (10/04/2018), 146 mg (10/18/2018)  palonosetron (ALOXI) injection 0.25 mg, 0.25 mg, Intravenous,  Once, 4 of 4 cycles. Administration: 0.25 mg (09/07/2018), 0.25 mg (09/20/2018), 0.25 mg (10/04/2018), 0.25 mg (10/18/2018)  pegfilgrastim-jmdb (FULPHILA) injection 6 mg, 6 mg, Subcutaneous,  Once, 4 of 4 cycles. Administration: 6 mg (09/09/2018), 6 mg (09/22/2018), 6 mg (10/06/2018), 6 mg (10/20/2018)  cyclophosphamide (CYTOXAN) 1,460 mg in sodium chloride 0.9 % 250 mL chemo infusion, 600 mg/m2 = 1,460 mg, Intravenous,  Once, 4 of 4 cycles. Administration: 1,460 mg (09/07/2018), 1,460 mg (09/20/2018), 1,460 mg (10/04/2018), 1,460 mg (10/18/2018)  PACLitaxel (TAXOL) 198 mg in sodium chloride 0.9 % 250 mL chemo infusion (</= $RemoveBefor'80mg'oOkIUzQfdALz$ /m2), 80 mg/m2 = 198 mg, Intravenous,  Once, 12 of 12 cycles. Dose modification: 65 mg/m2 (original dose 80 mg/m2, Cycle 9, Reason: Shalayna Ornstein Judgment, Comment: leukopenia), 50 mg/m2 (original dose 80 mg/m2, Cycle 14, Reason: Dose  not tolerated). Administration: 198 mg (11/01/2018), 198 mg (12/06/2018), 198 mg (12/13/2018), 156 mg (12/20/2018), 156 mg (12/27/2018), 156 mg (01/03/2019), 156 mg (01/10/2019), 156  mg (01/17/2019), 156 mg (01/24/2019), 120 mg (01/31/2019), 120 mg (02/07/2019), 120 mg (02/14/2019)  fosaprepitant (EMEND) 150 mg  dexamethasone (DECADRON) 12 mg in sodium chloride 0.9 % 145 mL IVPB, , Intravenous,  Once, 4 of 4 cycles. Administration:  (09/07/2018),  (09/20/2018),  (10/04/2018),  (10/18/2018)   03/2019 -  Radiation Therapy   Radiation at [facility]   04/2019 -  Anti-estrogen oral therapy   Tamoxifen     CHIEF COMPLIANT: Follow-up of left breast cancer on tamoxifen  INTERVAL HISTORY: Caitlin Graham is a 55 y.o. with above-mentioned history of left breast cancer who underwent a lumpectomy,adjuvant chemotherapy, radiation, and is currently on antiestrogen therapy with tamoxifen. She presents to the clinic todayfor follow-up.  Her major complaint today is continued problems with weight issues.  She is trying hard but has not lost any weight.  She has not started an exercise regimen.  She is tolerating tamoxifen reasonably well.  She does not have any complaints of fatigue or shortness of breath exertion.  ALLERGIES:  is allergic to aspirin, acetaminophen-codeine, ibuprofen, hydrocodone, oxycodone, and phenergan [promethazine].  MEDICATIONS:  Current Outpatient Medications  Medication Sig Dispense Refill  . acetaminophen (TYLENOL) 325 MG tablet Take 650 mg by mouth every 6 (six) hours as needed.    . clindamycin (CLINDAGEL) 1 % gel Apply topically 2 (two) times daily. 30 g 1  . cyanocobalamin (,VITAMIN B-12,) 1000 MCG/ML injection Inject 1 mL (1,000 mcg total) into the muscle every 30 (thirty) days. 1 mL 12  . ergocalciferol (VITAMIN D2) 1.25 MG (50000 UT) capsule Take 1 capsule (50,000 Units total) by mouth once a week. 12 capsule 0  . SYRINGE/NEEDLE, DISP, 1 ML 23G X 1" 1 ML MISC 1 Syringe by Does not apply route every 30 (thirty) days. Patient to use with administration of Vitamin B12 25 each 0  . tamoxifen (NOLVADEX) 20 MG tablet Take 1 tablet (20 mg total) by mouth daily. 90 tablet 3  .  traMADol (ULTRAM) 50 MG tablet Take 1 tablet (50 mg total) by mouth every 6 (six) hours as needed. 30 tablet 0   No current facility-administered medications for this visit.    PHYSICAL EXAMINATION: ECOG PERFORMANCE STATUS: 1 - Symptomatic but completely ambulatory  Vitals:   01/24/20 1437  BP: (!) 124/59  Pulse: 83  Resp: 16  Temp: (!) 97.3 F (36.3 C)  SpO2: 99%   Filed Weights   01/24/20 1437  Weight: 277 lb 4.8 oz (125.8 kg)    BREAST: No palpable masses or nodules in either right or left breasts. No palpable axillary supraclavicular or infraclavicular adenopathy no breast tenderness or nipple discharge. (exam performed in the presence of a chaperone)  LABORATORY DATA:  I have reviewed the data as listed CMP Latest Ref Rng & Units 10/25/2019 08/15/2019 02/14/2019  Glucose 70 - 99 mg/dL 91 94 73  BUN 6 - 20 mg/dL $Remove'12 14 15  'WcEmzhG$ Creatinine 0.44 - 1.00 mg/dL 0.71 0.90 0.69  Sodium 135 - 145 mmol/L 140 141 143  Potassium 3.5 - 5.1 mmol/L 4.0 4.2 4.2  Chloride 98 - 111 mmol/L 105 109 109  CO2 22 - 32 mmol/L $RemoveB'27 25 25  'ChPPGlNN$ Calcium 8.9 - 10.3 mg/dL 9.3 9.3 8.7(L)  Total Protein 6.5 - 8.1 g/dL 7.0 7.1 6.3(L)  Total Bilirubin 0.3 - 1.2 mg/dL 0.3 0.4 0.4  Alkaline Phos 38 - 126 U/L 72 73 80  AST 15 - 41 U/L 12(L) 10(L) 12(L)  ALT 0 - 44 U/L $Remo'14 12 18    'CLUlM$ Lab Results  Component Value Date   WBC 8.2 01/24/2020   HGB 13.7 01/24/2020   HCT 41.7 01/24/2020   MCV 98.8 01/24/2020   PLT 175 01/24/2020   NEUTROABS 4.7 01/24/2020    ASSESSMENT & PLAN:  Malignant neoplasm of upper-inner quadrant of left breast in female, estrogen receptor positive ([place]) 07/05/2018:Screening mammogram detected bilateral breast masses. Diagnostic mammogram and US showed cysts in the right breast and a 1.2cm left breast mass at the 9 o'clock position. 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 Stage Ia Brief course of neoadjuvant tamoxifen therapy started 07/07/2018 08/04/2018:  Left lumpectomy:Left lumpectomy [name]): IDC with intermediate grade DCIS, 1.2cm, grade 1, clear margins, with 3 axillary lymph nodes negative for carcinoma ER 100%, PR 60%, Ki-67 50%  Oncotype DX score: 32, distant recurrence at 9 years with hormone therapy alone: 20%  Treatment plan: 1.Adjuvant chemotherapy with dose dense Adriamycin and Cytoxan x4 followed by Taxol x12completed 02/14/2019 2.radiation therapyat [facility] started March 2021 3.Adjuvant antiestrogen therapywith tamoxifen ----------------------------------------------------------------------------------------------------------------------------- Current treatment: Tamoxifen 20 mg daily resumed 04/11/2019 (patient took tamoxifen prior to chemo) Tamoxifen toxicities: 1.  Moderate hot flashes 2. mood swings 3.  Weight gain: I discussed with her about different diets.  I instructed her that diets will work but she needs to maintain her activity levels and watch her diet continuously.  She was thinking of doing intermittent fasting as well as modified Atkins diet.  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 Her levels appear to be in good shape today.  Iron saturation of 44%  Return to clinic in 6 months with labs and follow-up   Orders Placed This Encounter  Procedures  . CBC with Differential (Cancer Center Only)    Standing Status:   Future    Standing Expiration Date:   01/23/2021  . Ferritin    Standing Status:   Future    Standing Expiration Date:   01/23/2021  . Iron and TIBC    Standing Status:   Future     Standing Expiration Date:   01/23/2021  . Vitamin B12    Standing Status:   Future    Standing Expiration Date:   01/23/2021   The patient has a good understanding of the overall plan. she agrees with it. she will call with any problems that may develop before the next visit here.  Total time spent: 20 mins including face to face time and time spent for planning, charting and coordination of care  [name], MD 01/24/2020  I, Cloyde Reams Dorshimer, am acting as scribe for Dr. [name].  I have reviewed the above documentation for accuracy and completeness, and I agree with the above.

## 2020-01-24 ENCOUNTER — Other Ambulatory Visit: Payer: Self-pay

## 2020-01-24 ENCOUNTER — Inpatient Hospital Stay: Payer: BC Managed Care – PPO

## 2020-01-24 ENCOUNTER — Inpatient Hospital Stay: Payer: BC Managed Care – PPO | Admitting: Hematology and Oncology

## 2020-01-24 ENCOUNTER — Inpatient Hospital Stay: Payer: BC Managed Care – PPO | Attending: Adult Health

## 2020-01-24 VITALS — BP 124/59 | HR 83 | Temp 97.3°F | Resp 16 | Ht 65.5 in | Wt 277.3 lb

## 2020-01-24 DIAGNOSIS — Z79810 Long term (current) use of selective estrogen receptor modulators (SERMs): Secondary | ICD-10-CM | POA: Diagnosis not present

## 2020-01-24 DIAGNOSIS — C50212 Malignant neoplasm of upper-inner quadrant of left female breast: Secondary | ICD-10-CM | POA: Insufficient documentation

## 2020-01-24 DIAGNOSIS — E538 Deficiency of other specified B group vitamins: Secondary | ICD-10-CM | POA: Insufficient documentation

## 2020-01-24 DIAGNOSIS — D509 Iron deficiency anemia, unspecified: Secondary | ICD-10-CM

## 2020-01-24 DIAGNOSIS — Z170 Estrogen receptor positive status [ER+]: Secondary | ICD-10-CM

## 2020-01-24 LAB — IRON AND TIBC
Iron: 125 ug/dL (ref 41–142)
Saturation Ratios: 44 % (ref 21–57)
TIBC: 283 ug/dL (ref 236–444)
UIBC: 158 ug/dL (ref 120–384)

## 2020-01-24 LAB — CBC WITH DIFFERENTIAL (CANCER CENTER ONLY)
Abs Immature Granulocytes: 0.07 10*3/uL (ref 0.00–0.07)
Basophils Absolute: 0 10*3/uL (ref 0.0–0.1)
Basophils Relative: 1 %
Eosinophils Absolute: 0.1 10*3/uL (ref 0.0–0.5)
Eosinophils Relative: 2 %
HCT: 41.7 % (ref 36.0–46.0)
Hemoglobin: 13.7 g/dL (ref 12.0–15.0)
Immature Granulocytes: 1 %
Lymphocytes Relative: 31 %
Lymphs Abs: 2.6 10*3/uL (ref 0.7–4.0)
MCH: 32.5 pg (ref 26.0–34.0)
MCHC: 32.9 g/dL (ref 30.0–36.0)
MCV: 98.8 fL (ref 80.0–100.0)
Monocytes Absolute: 0.7 10*3/uL (ref 0.1–1.0)
Monocytes Relative: 8 %
Neutro Abs: 4.7 10*3/uL (ref 1.7–7.7)
Neutrophils Relative %: 57 %
Platelet Count: 175 10*3/uL (ref 150–400)
RBC: 4.22 MIL/uL (ref 3.87–5.11)
RDW: 13.2 % (ref 11.5–15.5)
WBC Count: 8.2 10*3/uL (ref 4.0–10.5)
nRBC: 0 % (ref 0.0–0.2)

## 2020-01-24 NOTE — Assessment & Plan Note (Signed)
07/05/2018:Screening mammogram detected bilateral breast masses. Diagnostic mammogram and US showed cysts in the right breast and a 1.2cm left breast mass at the 9 o'clock position. 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 Stage Ia Brief course of neoadjuvant tamoxifen therapy started 07/07/2018 08/04/2018: Left lumpectomy:Left lumpectomy [name]): IDC with intermediate grade DCIS, 1.2cm, grade 1, clear margins, with 3 axillary lymph nodes negative for carcinoma ER 100%, PR 60%, Ki-67 50%  Oncotype DX score: 32, distant recurrence at 9 years with hormone therapy alone: 20%  Treatment plan: 1.Adjuvant chemotherapy with dose dense Adriamycin and Cytoxan x4 followed by Taxol x12completed 02/14/2019 2.radiation therapyat [facility] started March 2021 3.Adjuvant antiestrogen therapywith tamoxifen ----------------------------------------------------------------------------------------------------------------------------- Current treatment: Tamoxifen 20 mg daily resumed 04/11/2019 (patient took tamoxifen prior to chemo) Tamoxifen toxicities: 1.  Moderate hot flashes 2. mood swings 3.  Weight gain  Breast cancer surveillance: 1.  Mammogram 10/17/2019: Benign breast density category B 2. 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 Recheck labs

## 2020-01-25 LAB — FERRITIN: Ferritin: 154 ng/mL (ref 11–307)

## 2020-06-09 IMAGING — CR DG CHEST 1V PORT
1 series · 1 of 1 positions shown · non-contrast
Comparison: None.

CLINICAL DATA: Patient status post Port-A-Cath placement.

EXAM:
PORTABLE CHEST 1 VIEW

[chest ap]
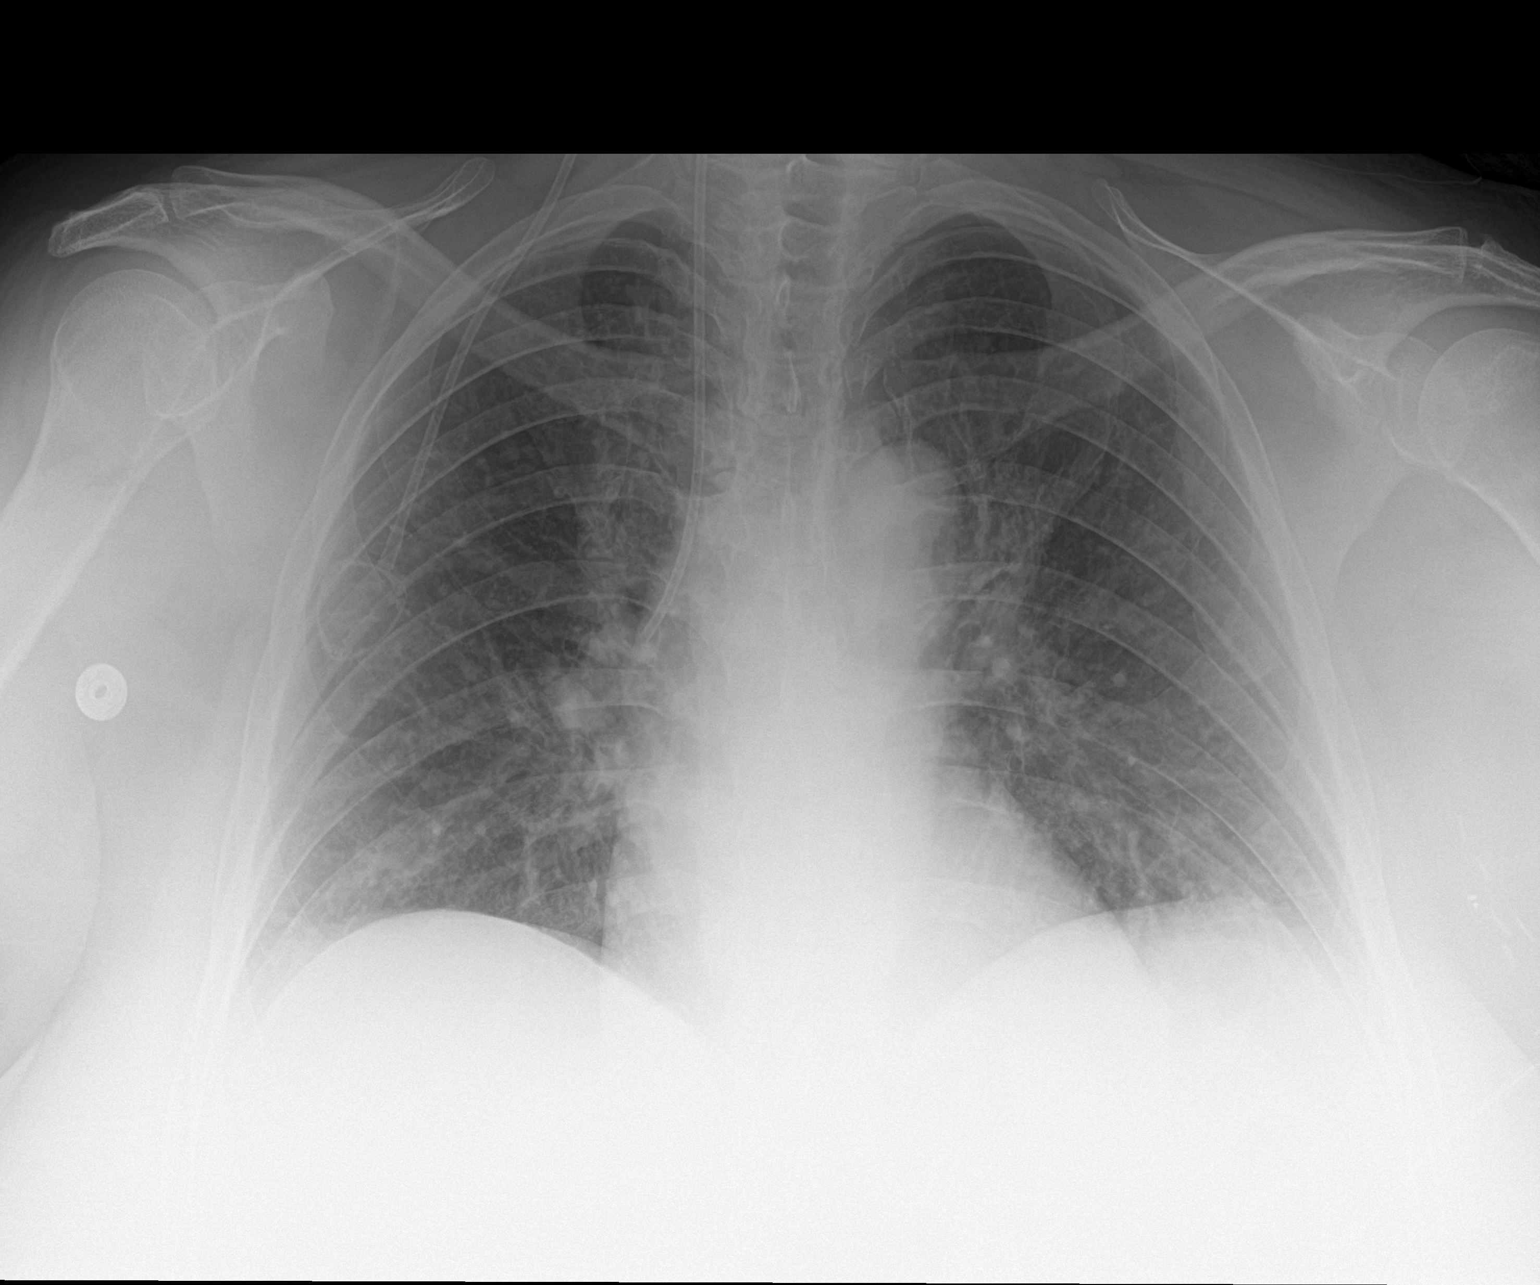

[1 of 1 positions shown; findings below may reference images not displayed]

FINDINGS: Right IJ approach Port-A-Cath tip projects in the mid to lower
superior vena cava. No pneumothorax. Lung volumes are low. Heart
size is normal. No acute or focal bony abnormality.
IMPRESSION: Port-A-Cath tip projects in the mid to lower superior vena cava. No
pneumothorax.

## 2020-06-25 ENCOUNTER — Other Ambulatory Visit: Payer: Self-pay | Admitting: Hematology and Oncology

## 2020-10-30 ENCOUNTER — Other Ambulatory Visit: Payer: Self-pay | Admitting: Hematology and Oncology

## 2020-10-30 MED ORDER — TAMOXIFEN CITRATE 20 MG PO TABS: 20.0000 mg | ORAL_TABLET | Freq: Every day | ORAL | 0 refills | Status: DC

## 2020-11-13 ENCOUNTER — Other Ambulatory Visit: Payer: Self-pay | Admitting: Adult Health

## 2020-11-13 ENCOUNTER — Encounter: Payer: Self-pay | Admitting: Hematology and Oncology

## 2020-11-13 DIAGNOSIS — Z9889 Other specified postprocedural states: Secondary | ICD-10-CM

## 2021-01-16 ENCOUNTER — Ambulatory Visit
Admission: RE | Admit: 2021-01-16 | Discharge: 2021-01-16 | Disposition: A | Discharge status: Home / Self Care | Payer: BC Managed Care – PPO | Source: Ambulatory Visit | Attending: Adult Health | Admitting: Adult Health

## 2021-01-16 DIAGNOSIS — Z9889 Other specified postprocedural states: Secondary | ICD-10-CM

## 2021-01-23 ENCOUNTER — Inpatient Hospital Stay: Payer: BC Managed Care – PPO | Admitting: Hematology and Oncology

## 2021-01-23 ENCOUNTER — Inpatient Hospital Stay: Payer: BC Managed Care – PPO

## 2021-01-25 ENCOUNTER — Other Ambulatory Visit: Payer: Self-pay | Admitting: *Deleted

## 2021-01-25 DIAGNOSIS — D509 Iron deficiency anemia, unspecified: Secondary | ICD-10-CM

## 2021-01-26 NOTE — Progress Notes (Signed)
You can use the table if you like  Patient Care Team: Murrell Redden Earlyne Iba, MD as PCP - General (Obstetrics and Gynecology) [name], MD as Consulting Physician (Hematology and Oncology) [name], MD as Consulting Physician (General Surgery) [name], MD as Attending Physician (Radiation Oncology)  DIAGNOSIS:    ICD-10-CM   1. Malignant neoplasm of upper-inner quadrant of left breast in female, estrogen receptor positive ([place])  C50.212    Z17.0       SUMMARY OF ONCOLOGIC HISTORY: Oncology History  Malignant neoplasm of upper-inner quadrant of left breast in female, estrogen receptor positive ([place])  07/05/2018 Initial Diagnosis   Screening mammogram detected bilateral breast masses. Diagnostic mammogram and US showed cysts in the right breast and a 1.2cm left breast mass at the 9 o'clock position. 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    07/07/2018 Cancer Staging   Staging form: Breast, AJCC 8th Edition - Clinical stage from 07/07/2018: Stage IA (cT1c, cN0, cM0, G2, ER+, PR+, HER2-)   07/18/2018 Genetic Testing   Negative testing. No pathogenic variants identified on the [facility] Common Hereditary Cancers Panel. The Common Hereditary Cancers Panel offered by [facility] includes sequencing and/or deletion duplication testing of the following 47 genes: APC, ATM, AXIN2, BARD1, BMPR1A, BRCA1, BRCA2, BRIP1, CDH1, CDKN2A (p14ARF), CDKN2A (p16INK4a), CKD4, CHEK2, CTNNA1, DICER1, EPCAM (Deletion/duplication testing only), GREM1 (promoter region deletion/duplication testing only), KIT, MEN1, MLH1, MSH2, MSH3, MSH6, MUTYH, NBN, NF1, NHTL1, PALB2, PDGFRA, PMS2, POLD1, POLE, PTEN, RAD50, RAD51C, RAD51D, SDHB, SDHC, SDHD, SMAD4, SMARCA4. STK11, TP53, TSC1, TSC2, and VHL.  The following genes were evaluated for sequence changes only: SDHA and HOXB13 c.251G>A variant only. The report date is 07/18/2018.   08/04/2018 Surgery   Left lumpectomy [name])  [phone]): IDC with intermediate grade DCIS, 1.2cm, grade 1, clear margins, with 3 axillary lymph nodes negative for carcinoma ER 100%, PR 60%, Ki-67 50%   08/04/2018 Oncotype testing   The Oncotype DX score was 32 predicting a risk of outside the breast recurrence over the next 9 years of 20% if the patient's only systemic therapy is tamoxifen for 5 years.    09/07/2018 - 02/14/2019 Chemotherapy   dexamethasone (DECADRON) 4 MG tablet, 1 of 1 cycle, Start date: 08/25/2018, End date: 11/01/2018  DOXOrubicin (ADRIAMYCIN) chemo injection 146 mg, 60 mg/m2 = 146 mg, Intravenous,  Once, 4 of 4 cycles. Administration: 146 mg (09/07/2018), 146 mg (09/20/2018), 146 mg (10/04/2018), 146 mg (10/18/2018)  palonosetron (ALOXI) injection 0.25 mg, 0.25 mg, Intravenous,  Once, 4 of 4 cycles. Administration: 0.25 mg (09/07/2018), 0.25 mg (09/20/2018), 0.25 mg (10/04/2018), 0.25 mg (10/18/2018)  pegfilgrastim-jmdb (FULPHILA) injection 6 mg, 6 mg, Subcutaneous,  Once, 4 of 4 cycles. Administration: 6 mg (09/09/2018), 6 mg (09/22/2018), 6 mg (10/06/2018), 6 mg (10/20/2018)  cyclophosphamide (CYTOXAN) 1,460 mg in sodium chloride 0.9 % 250 mL chemo infusion, 600 mg/m2 = 1,460 mg, Intravenous,  Once, 4 of 4 cycles. Administration: 1,460 mg (09/07/2018), 1,460 mg (09/20/2018), 1,460 mg (10/04/2018), 1,460 mg (10/18/2018)  PACLitaxel (TAXOL) 198 mg in sodium chloride 0.9 % 250 mL chemo infusion (</= 25m/m2), 80 mg/m2 = 198 mg, Intravenous,  Once, 12 of 12 cycles. Dose modification: 65 mg/m2 (original dose 80 mg/m2, Cycle 9, Reason: Provider Judgment, Comment: leukopenia), 50 mg/m2 (original dose 80 mg/m2, Cycle 14, Reason: Dose not tolerated). Administration: 198 mg (11/01/2018), 198 mg (12/06/2018), 198 mg (12/13/2018), 156 mg (12/20/2018), 156 mg (12/27/2018), 156 mg (01/03/2019), 156 mg (01/10/2019), 156  mg (01/17/2019), 156 mg (01/24/2019), 120 mg (01/31/2019), 120 mg (02/07/2019), 120 mg (02/14/2019)  fosaprepitant (EMEND) 150  mg  dexamethasone (DECADRON) 12 mg in sodium chloride 0.9 % 145 mL IVPB, , Intravenous,  Once, 4 of 4 cycles. Administration:  (09/07/2018),  (09/20/2018),  (10/04/2018),  (10/18/2018)   03/2019 -  Radiation Therapy   Radiation at [facility]   04/2019 -  Anti-estrogen oral therapy   Tamoxifen     CHIEF COMPLIANT: Follow-up of left breast cancer on tamoxifen and iron deficiency and B12 deficiency anemias  INTERVAL HISTORY: Caitlin Graham is a 56 y.o. with above-mentioned history of left breast cancer who underwent a lumpectomy, adjuvant chemotherapy, radiation, and is currently on antiestrogen therapy with tamoxifen. Mammogram on 01/16/2021 showed no evidence of malignancy bilaterally. She presents to the clinic today for follow-up.  She has not been doing B12 injections at home because her husband does not want to do it and she cannot do it herself.  She has not had a B12 shot in over 6 months.  She had occasional light dizziness but otherwise doing quite well.  Denies any lumps or nodules in the breast.  Her major complaint today is muscle cramps especially at nighttime she is unable to sleep because of the pain.  ALLERGIES:  is allergic to aspirin, acetaminophen-codeine, ibuprofen, hydrocodone, oxycodone, and phenergan [promethazine].  MEDICATIONS:  Current Outpatient Medications  Medication Sig Dispense Refill   acetaminophen (TYLENOL) 325 MG tablet Take 650 mg by mouth every 6 (six) hours as needed.     clindamycin (CLINDAGEL) 1 % gel Apply topically 2 (two) times daily. 30 g 1   cyanocobalamin (,VITAMIN B-12,) 1000 MCG/ML injection Inject 1 mL (1,000 mcg total) into the muscle every 30 (thirty) days. 1 mL 12   ergocalciferol (VITAMIN D2) 1.25 MG (50000 UT) capsule Take 1 capsule (50,000 Units total) by mouth once a week. 12 capsule 0   SYRINGE/NEEDLE, DISP, 1 ML 23G X 1" 1 ML MISC 1 Syringe by Does not apply route every 30 (thirty) days. Patient to use with administration of Vitamin B12 25 each 0    tamoxifen (NOLVADEX) 20 MG tablet Take 1 tablet (20 mg total) by mouth daily. 90 tablet 0   traMADol (ULTRAM) 50 MG tablet Take 1 tablet (50 mg total) by mouth every 6 (six) hours as needed. 30 tablet 0   No current facility-administered medications for this visit.    PHYSICAL EXAMINATION: ECOG PERFORMANCE STATUS: 1 - Symptomatic but completely ambulatory  Vitals:   01/28/21 0833  BP: 122/61  Pulse: 75  Resp: 18  Temp: (!) 97.2 F (36.2 C)  SpO2: 99%   Filed Weights   01/28/21 0833  Weight: 279 lb 12.8 oz (126.9 kg)    BREAST: No palpable masses or nodules in either right or left breasts. No palpable axillary supraclavicular or infraclavicular adenopathy no breast tenderness or nipple discharge. (exam performed in the presence of a chaperone)  LABORATORY DATA:  I have reviewed the data as listed CMP Latest Ref Rng & Units 10/25/2019 08/15/2019 02/14/2019  Glucose 70 - 99 mg/dL 91 94 73  BUN 6 - 20 mg/dL _0 Creatinine 0.44 - 1.00 mg/dL 0.71 0.90 0.69  Sodium 135 - 145 mmol/L 140 141 143  Potassium 3.5 - 5.1 mmol/L 4.0 4.2 4.2  Chloride 98 - 111 mmol/L 105 109 109  CO2 22 - 32 mmol/L _1 Calcium 8.9 - 10.3 mg/dL 9.3 9.3 8.7(L)  Total Protein  6.5 - 8.1 g/dL 7.0 7.1 6.3(L)  Total Bilirubin 0.3 - 1.2 mg/dL 0.3 0.4 0.4  Alkaline Phos 38 - 126 U/L 72 73 80  AST 15 - 41 U/L 12(L) 10(L) 12(L)  ALT 0 - 44 U/L _0 Lab Results  Component Value Date   WBC 7.3 01/28/2021   HGB 13.0 01/28/2021   HCT 39.7 01/28/2021   MCV 98.3 01/28/2021   PLT 186 01/28/2021   NEUTROABS 4.3 01/28/2021    ASSESSMENT & PLAN:  Malignant neoplasm of upper-inner quadrant of left breast in female, estrogen receptor positive ([place]) 07/05/2018:Screening mammogram detected bilateral breast masses. Diagnostic mammogram and US showed cysts in the right breast and a 1.2cm left breast mass at the 9 o'clock position. 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  Stage Ia Brief course of neoadjuvant tamoxifen therapy started 07/07/2018 08/04/2018: Left lumpectomy:Left lumpectomy [name]): IDC with intermediate grade DCIS, 1.2cm, grade 1, clear margins, with 3 axillary lymph nodes negative for carcinoma ER 100%, PR 60%, Ki-67 50%   Oncotype DX score: 32, distant recurrence at 9 years with hormone therapy alone: 20%   Treatment plan: 1.  Adjuvant chemotherapy with dose dense Adriamycin and Cytoxan x4 followed by Taxol x12 completed 02/14/2019 2.  radiation [facility] at [facility] started March 2021 3.  Adjuvant antiestrogen therapy with tamoxifen ----------------------------------------------------------------------------------------------------------------------------- Current treatment: Tamoxifen 20 mg daily resumed 04/11/2019 (patient took tamoxifen prior to chemo) Tamoxifen toxicities: 1.  Moderate hot flashes 2. mood swings 3.  Toenails: The big toenail is not growing properly. 4.  Muscle cramps especially at night: I instructed her to take tamoxifen at bedtime.  Weight issues: She is doing everything that she can think of but has not been losing weight.  She will join [employer].  I recommended that we should consider weight loss medication like Tirzapatide.   Breast cancer surveillance:  Mammogram 01/16/2021: Benign breast density category B Breast exam 01/28/2021: Benign     B12 deficiency: Patient has prescription for B12 but she has not been taking it. 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 Awaiting  iron studies from today   Return to clinic in 1 yr with labs and follow-up    No orders of the defined types were placed in this encounter.  The patient has a good understanding of the overall plan. she agrees with it. she will call with any problems that may develop before the next visit here.  Total time spent: 20 mins including face to face time and time spent for planning, charting and coordination of care  Rulon Eisenmenger, MD, MPH 01/28/2021  I, Thana Ates, am acting as scribe for Dr. [name].  I have reviewed the above documentation for accuracy and completeness, and I agree with the above.

## 2021-01-28 ENCOUNTER — Other Ambulatory Visit: Payer: Self-pay

## 2021-01-28 ENCOUNTER — Inpatient Hospital Stay: Payer: BC Managed Care – PPO | Admitting: Hematology and Oncology

## 2021-01-28 ENCOUNTER — Inpatient Hospital Stay: Payer: BC Managed Care – PPO | Attending: Hematology and Oncology

## 2021-01-28 DIAGNOSIS — D509 Iron deficiency anemia, unspecified: Secondary | ICD-10-CM | POA: Diagnosis not present

## 2021-01-28 DIAGNOSIS — C50212 Malignant neoplasm of upper-inner quadrant of left female breast: Secondary | ICD-10-CM | POA: Diagnosis present

## 2021-01-28 DIAGNOSIS — Z170 Estrogen receptor positive status [ER+]: Secondary | ICD-10-CM | POA: Insufficient documentation

## 2021-01-28 DIAGNOSIS — E538 Deficiency of other specified B group vitamins: Secondary | ICD-10-CM | POA: Diagnosis not present

## 2021-01-28 DIAGNOSIS — Z79810 Long term (current) use of selective estrogen receptor modulators (SERMs): Secondary | ICD-10-CM | POA: Diagnosis not present

## 2021-01-28 LAB — VITAMIN B12: Vitamin B-12: 194 pg/mL (ref 180–914)

## 2021-01-28 LAB — IRON AND IRON BINDING CAPACITY (CC-WL,HP ONLY)
Iron: 126 ug/dL (ref 28–170)
Saturation Ratios: 39 % — ABNORMAL HIGH (ref 10.4–31.8)
TIBC: 323 ug/dL (ref 250–450)
UIBC: 197 ug/dL (ref 148–442)

## 2021-01-28 LAB — FERRITIN: Ferritin: 119 ng/mL (ref 11–307)

## 2021-01-28 LAB — CBC WITH DIFFERENTIAL (CANCER CENTER ONLY)
Abs Immature Granulocytes: 0.08 10*3/uL — ABNORMAL HIGH (ref 0.00–0.07)
Basophils Absolute: 0.1 10*3/uL (ref 0.0–0.1)
Basophils Relative: 1 %
Eosinophils Absolute: 0.1 10*3/uL (ref 0.0–0.5)
Eosinophils Relative: 2 %
HCT: 39.7 % (ref 36.0–46.0)
Hemoglobin: 13 g/dL (ref 12.0–15.0)
Immature Granulocytes: 1 %
Lymphocytes Relative: 29 %
Lymphs Abs: 2.1 10*3/uL (ref 0.7–4.0)
MCH: 32.2 pg (ref 26.0–34.0)
MCHC: 32.7 g/dL (ref 30.0–36.0)
MCV: 98.3 fL (ref 80.0–100.0)
Monocytes Absolute: 0.6 10*3/uL (ref 0.1–1.0)
Monocytes Relative: 9 %
Neutro Abs: 4.3 10*3/uL (ref 1.7–7.7)
Neutrophils Relative %: 58 %
Platelet Count: 186 10*3/uL (ref 150–400)
RBC: 4.04 MIL/uL (ref 3.87–5.11)
RDW: 13 % (ref 11.5–15.5)
WBC Count: 7.3 10*3/uL (ref 4.0–10.5)
nRBC: 0 % (ref 0.0–0.2)

## 2021-01-28 MED ORDER — TRAMADOL HCL 50 MG PO TABS: 50.0000 mg | ORAL_TABLET | Freq: Four times a day (QID) | ORAL | 0 refills | Status: DC | PRN

## 2021-01-28 NOTE — Assessment & Plan Note (Signed)
07/05/2018:Screening mammogram detected bilateral breast masses. Diagnostic mammogram and US showed cysts in the right breast and a 1.2cm left breast mass at the 9 o'clock position. 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 Stage Ia Brief course of neoadjuvant tamoxifen therapy started 07/07/2018 08/04/2018: Left lumpectomy:Left lumpectomy [name]): IDC with intermediate grade DCIS, 1.2cm, grade 1, clear margins, with 3 axillary lymph nodes negative for carcinoma ER 100%, PR 60%, Ki-67 50%  Oncotype DX score: 32, distant recurrence at 9 years with hormone therapy alone: 20%  Treatment plan: 1.Adjuvant chemotherapy with dose dense Adriamycin and Cytoxan x4 followed by Taxol x12completed 02/14/2019 2.radiation therapyat [facility] started March 2021 3.Adjuvant antiestrogen therapywith tamoxifen ----------------------------------------------------------------------------------------------------------------------------- Current treatment: Tamoxifen 20 mg daily resumed 04/11/2019 (patient took tamoxifen prior to chemo) Tamoxifen toxicities: 1.Moderate hot flashes 2.mood swings 3.Weight gain: I discussed with her about different diets.  I instructed her that diets will work but she needs to maintain her activity levels and watch her diet continuously.  She was thinking of doing intermittent fasting as well as modified Atkins diet.  Breast cancer surveillance: Mammogram 01/16/2021: Benign breast density category B   B12 deficiency: Patient receiving B12 injections at home Iron deficiency: 10/25/2019: Hemoglobin 13.6, B12 151, iron saturation 38%, TIBC 301, ferritin of 182: Received IV iron in September 2021 01/24/2020: Hemoglobin 13.7, MCV 98.8, RDW 13.2, iron saturation 44%  BothB12 and iron deficiency arerelated to her bariatric surgery causing malabsorption of iron and B12. Awaiting labs from today  Return to clinic in 6 months with labs and  follow-up

## 2021-03-18 ENCOUNTER — Other Ambulatory Visit: Payer: Self-pay | Admitting: Hematology and Oncology

## 2021-09-02 ENCOUNTER — Encounter: Payer: Self-pay | Admitting: Hematology and Oncology

## 2022-01-28 ENCOUNTER — Other Ambulatory Visit: Payer: Self-pay | Admitting: *Deleted

## 2022-01-28 ENCOUNTER — Inpatient Hospital Stay: Payer: BC Managed Care – PPO | Admitting: Hematology and Oncology

## 2022-01-28 ENCOUNTER — Inpatient Hospital Stay: Payer: BC Managed Care – PPO | Attending: Hematology and Oncology

## 2022-01-28 VITALS — BP 129/52 | HR 62 | Temp 97.3°F | Resp 17 | Wt 280.3 lb

## 2022-01-28 DIAGNOSIS — C50212 Malignant neoplasm of upper-inner quadrant of left female breast: Secondary | ICD-10-CM

## 2022-01-28 DIAGNOSIS — Z79810 Long term (current) use of selective estrogen receptor modulators (SERMs): Secondary | ICD-10-CM | POA: Insufficient documentation

## 2022-01-28 DIAGNOSIS — Z170 Estrogen receptor positive status [ER+]: Secondary | ICD-10-CM | POA: Insufficient documentation

## 2022-01-28 DIAGNOSIS — E538 Deficiency of other specified B group vitamins: Secondary | ICD-10-CM | POA: Insufficient documentation

## 2022-01-28 LAB — CBC WITH DIFFERENTIAL (CANCER CENTER ONLY)
Abs Immature Granulocytes: 0.04 10*3/uL (ref 0.00–0.07)
Basophils Absolute: 0.1 10*3/uL (ref 0.0–0.1)
Basophils Relative: 1 %
Eosinophils Absolute: 0.2 10*3/uL (ref 0.0–0.5)
Eosinophils Relative: 2 %
HCT: 40 % (ref 36.0–46.0)
Hemoglobin: 13.5 g/dL (ref 12.0–15.0)
Immature Granulocytes: 1 %
Lymphocytes Relative: 28 %
Lymphs Abs: 2.1 10*3/uL (ref 0.7–4.0)
MCH: 32.9 pg (ref 26.0–34.0)
MCHC: 33.8 g/dL (ref 30.0–36.0)
MCV: 97.6 fL (ref 80.0–100.0)
Monocytes Absolute: 0.6 10*3/uL (ref 0.1–1.0)
Monocytes Relative: 9 %
Neutro Abs: 4.5 10*3/uL (ref 1.7–7.7)
Neutrophils Relative %: 59 %
Platelet Count: 186 10*3/uL (ref 150–400)
RBC: 4.1 MIL/uL (ref 3.87–5.11)
RDW: 12.9 % (ref 11.5–15.5)
WBC Count: 7.5 10*3/uL (ref 4.0–10.5)
nRBC: 0 % (ref 0.0–0.2)

## 2022-01-28 LAB — CMP (CANCER CENTER ONLY)
ALT: 18 U/L (ref 0–44)
AST: 17 U/L (ref 15–41)
Albumin: 3.5 g/dL (ref 3.5–5.0)
Alkaline Phosphatase: 63 U/L (ref 38–126)
Anion gap: 5 (ref 5–15)
BUN: 14 mg/dL (ref 6–20)
CO2: 27 mmol/L (ref 22–32)
Calcium: 9 mg/dL (ref 8.9–10.3)
Chloride: 110 mmol/L (ref 98–111)
Creatinine: 0.65 mg/dL (ref 0.44–1.00)
GFR, Estimated: >60 mL/min (ref >60)
Glucose, Bld: 93 mg/dL (ref 70–99)
Potassium: 4.3 mmol/L (ref 3.5–5.1)
Sodium: 142 mmol/L (ref 135–145)
Total Bilirubin: 0.4 mg/dL (ref 0.3–1.2)
Total Protein: 5.9 g/dL — ABNORMAL LOW (ref 6.5–8.1)

## 2022-01-28 LAB — IRON AND IRON BINDING CAPACITY (CC-WL,HP ONLY)
Iron: 97 ug/dL (ref 28–170)
Saturation Ratios: 31 % (ref 10.4–31.8)
TIBC: 311 ug/dL (ref 250–450)
UIBC: 214 ug/dL (ref 148–442)

## 2022-01-28 LAB — FERRITIN: Ferritin: 39 ng/mL (ref 11–307)

## 2022-01-28 LAB — VITAMIN B12: Vitamin B-12: 277 pg/mL (ref 180–914)

## 2022-01-28 MED ORDER — ANASTROZOLE 1 MG PO TABS: 1.0000 mg | ORAL_TABLET | Freq: Every day | ORAL | 3 refills | Status: DC

## 2022-01-28 NOTE — Assessment & Plan Note (Signed)
07/05/2018:Screening mammogram detected bilateral breast masses. Diagnostic mammogram and US showed cysts in the right breast and a 1.2cm left breast mass at the 9 o'clock position. 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  Stage Ia Brief course of neoadjuvant tamoxifen therapy started 07/07/2018 08/04/2018: Left lumpectomy:Left lumpectomy [name]): IDC with intermediate grade DCIS, 1.2cm, grade 1, clear margins, with 3 axillary lymph nodes negative for carcinoma ER 100%, PR 60%, Ki-67 50%   Oncotype DX score: 32, distant recurrence at 9 years with hormone therapy alone: 20%   Treatment plan: 1.  Adjuvant chemotherapy with dose dense Adriamycin and Cytoxan x4 followed by Taxol x12 completed 02/14/2019 2.  radiation therapy at [facility] started March 2021 3.  Adjuvant antiestrogen therapy with tamoxifen resumed 04/11/2019 ----------------------------------------------------------------------------------------------------------------------------- Current treatment: Tamoxifen 20 mg daily resumed 04/11/2019 (patient took tamoxifen prior to chemo) Tamoxifen toxicities: 1.  Moderate hot flashes 2. mood swings 3.  Toenails: The big toenail is not growing properly. 4.  Muscle cramps especially at night: I instructed her to take tamoxifen at bedtime.   Weight issues: She is doing everything that she can think of but has not been losing weight.  She will join [employer].  I recommended that we should consider weight loss medication like Tirzapatide.   Breast cancer surveillance:  Mammogram 01/16/2021: Benign breast density category B Breast exam 01/28/2021: Benign     B12 deficiency: Patient has prescription for B12 but she has not been taking it. Iron deficiency:  10/25/2019: Hemoglobin 13.6, B12 151, iron saturation 38%, TIBC 301, ferritin of 182: Received IV iron in September 2021 01/24/2020: Hemoglobin 13.7, MCV 98.8, RDW 13.2, iron saturation 44% 01/28/2021: Hemoglobin  13, MCV 98.3, iron studies are pending   both B12 and iron deficiency are related to her bariatric surgery causing malabsorption of iron and B12: Patient has not been doing B12 injections because of her husband not doing the shots and she cannot do it herself. Awaiting iron studies from today   Return to clinic in 1 yr with labs and follow-up

## 2022-01-28 NOTE — Progress Notes (Signed)
Patient Care Team: Charyl Bigger, MD as PCP - General (Obstetrics and Gynecology) [name], MD as Consulting Physician (Hematology and Oncology) [name], MD as Consulting Physician (General Surgery) [name], MD as Attending Physician (Radiation Oncology)  DIAGNOSIS:  Encounter Diagnosis  Name Primary?   Malignant neoplasm of upper-inner quadrant of left breast in female, estrogen receptor positive ([place]) Yes    SUMMARY OF ONCOLOGIC HISTORY: Oncology History  Malignant neoplasm of upper-inner quadrant of left breast in female, estrogen receptor positive ([place])  07/05/2018 Initial Diagnosis   Screening mammogram detected bilateral breast masses. Diagnostic mammogram and US showed cysts in the right breast and a 1.2cm left breast mass at the 9 o'clock position. 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    07/07/2018 Cancer Staging   Staging form: Breast, AJCC 8th Edition - Clinical stage from 07/07/2018: Stage IA (cT1c, cN0, cM0, G2, ER+, PR+, HER2-)   07/18/2018 Genetic Testing   Negative testing. No pathogenic variants identified on the [facility] Common Hereditary Cancers Panel. The Common Hereditary Cancers Panel offered by [facility] includes sequencing and/or deletion duplication testing of the following 47 genes: APC, ATM, AXIN2, BARD1, BMPR1A, BRCA1, BRCA2, BRIP1, CDH1, CDKN2A (p14ARF), CDKN2A (p16INK4a), CKD4, CHEK2, CTNNA1, DICER1, EPCAM (Deletion/duplication testing only), GREM1 (promoter region deletion/duplication testing only), KIT, MEN1, MLH1, MSH2, MSH3, MSH6, MUTYH, NBN, NF1, NHTL1, PALB2, PDGFRA, PMS2, POLD1, POLE, PTEN, RAD50, RAD51C, RAD51D, SDHB, SDHC, SDHD, SMAD4, SMARCA4. STK11, TP53, TSC1, TSC2, and VHL.  The following genes were evaluated for sequence changes only: SDHA and HOXB13 c.251G>A variant only. The report date is 07/18/2018.   08/04/2018 Surgery   Left lumpectomy [name]) [phone]): IDC with intermediate grade  DCIS, 1.2cm, grade 1, clear margins, with 3 axillary lymph nodes negative for carcinoma ER 100%, PR 60%, Ki-67 50%   08/04/2018 Oncotype testing   The Oncotype DX score was 32 predicting a risk of outside the breast recurrence over the next 9 years of 20% if the patient's only systemic therapy is tamoxifen for 5 years.    09/07/2018 - 02/14/2019 Chemotherapy   dexamethasone (DECADRON) 4 MG tablet, 1 of 1 cycle, Start date: 08/25/2018, End date: 11/01/2018  DOXOrubicin (ADRIAMYCIN) chemo injection 146 mg, 60 mg/m2 = 146 mg, Intravenous,  Once, 4 of 4 cycles. Administration: 146 mg (09/07/2018), 146 mg (09/20/2018), 146 mg (10/04/2018), 146 mg (10/18/2018)  palonosetron (ALOXI) injection 0.25 mg, 0.25 mg, Intravenous,  Once, 4 of 4 cycles. Administration: 0.25 mg (09/07/2018), 0.25 mg (09/20/2018), 0.25 mg (10/04/2018), 0.25 mg (10/18/2018)  pegfilgrastim-jmdb (FULPHILA) injection 6 mg, 6 mg, Subcutaneous,  Once, 4 of 4 cycles. Administration: 6 mg (09/09/2018), 6 mg (09/22/2018), 6 mg (10/06/2018), 6 mg (10/20/2018)  cyclophosphamide (CYTOXAN) 1,460 mg in sodium chloride 0.9 % 250 mL chemo infusion, 600 mg/m2 = 1,460 mg, Intravenous,  Once, 4 of 4 cycles. Administration: 1,460 mg (09/07/2018), 1,460 mg (09/20/2018), 1,460 mg (10/04/2018), 1,460 mg (10/18/2018)  PACLitaxel (TAXOL) 198 mg in sodium chloride 0.9 % 250 mL chemo infusion (</= '80mg'$ /m2), 80 mg/m2 = 198 mg, Intravenous,  Once, 12 of 12 cycles. Dose modification: 65 mg/m2 (original dose 80 mg/m2, Cycle 9, Reason: Provider Judgment, Comment: leukopenia), 50 mg/m2 (original dose 80 mg/m2, Cycle 14, Reason: Dose not tolerated). Administration: 198 mg (11/01/2018), 198 mg (12/06/2018), 198 mg (12/13/2018), 156 mg (12/20/2018), 156 mg (12/27/2018), 156 mg (01/03/2019), 156 mg (01/10/2019), 156 mg (01/17/2019), 156 mg (01/24/2019), 120 mg (01/31/2019), 120 mg (02/07/2019), 120 mg (02/14/2019)  fosaprepitant (EMEND) 150 mg  dexamethasone (DECADRON) 12 mg in sodium chloride 0.9  % 145 mL IVPB, , Intravenous,  Once, 4 of 4 cycles. Administration:  (09/07/2018),  (09/20/2018),  (10/04/2018),  (10/18/2018)   03/2019 -  Radiation Therapy   Radiation at [facility]   04/2019 -  Anti-estrogen oral therapy   Tamoxifen     CHIEF COMPLIANT: Follow-up of left breast cancer on tamoxifen and iron deficiency and B12 deficiency anemias   INTERVAL HISTORY: Caitlin Graham is a 57 y.o. with above-mentioned history of left breast cancer who underwent a lumpectomy, adjuvant chemotherapy, radiation, and is currently on antiestrogen therapy with tamoxifen. Mammogram on 01/16/2021 showed no evidence of malignancy bilaterally. She presents to the clinic today for follow-up. She states that she has some fatigue. She complains of numbness in her feet, she states mainly in her left foot. She says she haven't had a cycle since she started chemo. She had severe hot flashes from taking the tamoxifen.   ALLERGIES:  is allergic to aspirin, acetaminophen-codeine, ibuprofen, hydrocodone, oxycodone, and promethazine.  MEDICATIONS:  Current Outpatient Medications  Medication Sig Dispense Refill   anastrozole (ARIMIDEX) 1 MG tablet Take 1 tablet (1 mg total) by mouth daily. 90 tablet 3   acetaminophen (TYLENOL) 325 MG tablet Take 650 mg by mouth every 6 (six) hours as needed.     cyanocobalamin (,VITAMIN B-12,) 1000 MCG/ML injection Inject 1 mL (1,000 mcg total) into the muscle every 30 (thirty) days. 1 mL 12   ergocalciferol (VITAMIN D2) 1.25 MG (50000 UT) capsule Take 1 capsule (50,000 Units total) by mouth once a week. 12 capsule 0   SYRINGE/NEEDLE, DISP, 1 ML 23G X 1" 1 ML MISC 1 Syringe by Does not apply route every 30 (thirty) days. Patient to use with administration of Vitamin B12 25 each 0   traMADol (ULTRAM) 50 MG tablet Take 1 tablet (50 mg total) by mouth every 6 (six) hours as needed. 30 tablet 0   No current facility-administered medications for this visit.    PHYSICAL EXAMINATION: ECOG PERFORMANCE  STATUS: 1 - Symptomatic but completely ambulatory  Vitals:   01/28/22 0822  BP: (!) 129/52  Pulse: 62  Resp: 17  Temp: (!) 97.3 F (36.3 C)  SpO2: 98%   Filed Weights   01/28/22 0822  Weight: 280 lb 5 oz (127.1 kg)    BREAST: No palpable masses or nodules in either right or left breasts. No palpable axillary supraclavicular or infraclavicular adenopathy no breast tenderness or nipple discharge. (exam performed in the presence of a chaperone)  LABORATORY DATA:  I have reviewed the data as listed    Latest Ref Rng & Units 10/25/2019   10:28 AM 08/15/2019    2:12 PM 02/14/2019    9:05 AM  CMP  Glucose 70 - 99 mg/dL 91  94  73   BUN 6 - 20 mg/dL '12  14  15   '$ Creatinine 0.44 - 1.00 mg/dL 0.71  0.90  0.69   Sodium 135 - 145 mmol/L 140  141  143   Potassium 3.5 - 5.1 mmol/L 4.0  4.2  4.2   Chloride 98 - 111 mmol/L 105  109  109   CO2 22 - 32 mmol/L '27  25  25   '$ Calcium 8.9 - 10.3 mg/dL 9.3  9.3  8.7   Total Protein 6.5 - 8.1 g/dL 7.0  7.1  6.3   Total Bilirubin 0.3 - 1.2 mg/dL 0.3  0.4  0.4  Alkaline Phos 38 - 126 U/L 72  73  80   AST 15 - 41 U/L '12  10  12   '$ ALT 0 - 44 U/L '14  12  18     '$ Lab Results  Component Value Date   WBC 7.5 01/28/2022   HGB 13.5 01/28/2022   HCT 40.0 01/28/2022   MCV 97.6 01/28/2022   PLT 186 01/28/2022   NEUTROABS 4.5 01/28/2022    ASSESSMENT & PLAN:  Malignant neoplasm of upper-inner quadrant of left breast in female, estrogen receptor positive ([place]) 07/05/2018:Screening mammogram detected bilateral breast masses. Diagnostic mammogram and US showed cysts in the right breast and a 1.2cm left breast mass at the 9 o'clock position. 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  Stage Ia Brief course of neoadjuvant tamoxifen therapy started 07/07/2018 08/04/2018: Left lumpectomy:Left lumpectomy [name]): IDC with intermediate grade DCIS, 1.2cm, grade 1, clear margins, with 3 axillary lymph nodes negative for carcinoma ER  100%, PR 60%, Ki-67 50%   Oncotype DX score: 32, distant recurrence at 9 years with hormone therapy alone: 20%   Treatment plan: 1.  Adjuvant chemotherapy with dose dense Adriamycin and Cytoxan x4 followed by Taxol x12 completed 02/14/2019 2.  radiation therapy at [facility] started March 2021 3.  Adjuvant antiestrogen therapy with tamoxifen resumed 04/11/2019 ----------------------------------------------------------------------------------------------------------------------------- Current treatment: Tamoxifen 20 mg daily resumed 04/11/2019 (patient took tamoxifen prior to chemo) Switching to anastrozole 1 mg daily starting 01/28/2022 because she has not had cycles in 3 years.  Tamoxifen toxicities: 1.  Moderate hot flashes 2. mood swings 3.  Toenails: Toenail fungus: Getting treatment with the podiatrist.     Weight issues: She is very anxious about taking weight loss injections.  She is going to see if her bypass surgery can be revised..   Breast cancer surveillance:  Mammogram 01/16/2021: Benign breast density category B Breast exam 01/28/2021: Benign     B12 deficiency: Patient has prescription for B12 but she has not been taking it. Iron deficiency:  10/25/2019: Hemoglobin 13.6, B12 151, iron saturation 38%, TIBC 301, ferritin of 182: Received IV iron in September 2021 01/24/2020: Hemoglobin 13.7, MCV 98.8, RDW 13.2, iron saturation 44% 01/28/2021: Hemoglobin 13, MCV 98.3 01/28/2022: Hemoglobin 13.5, B12 and iron studies are pending.  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 Awaiting iron studies from today   Telephone visit in 3 months to assess tolerance to anastrozole therapy. Return to clinic in 1 yr with labs and follow-up   Orders Placed This Encounter  Procedures   CMP ([place]  only)    Standing Status:   Future    Number of Occurrences:   1    Standing Expiration Date:   01/29/2023   The patient has a good understanding of the overall plan. she agrees with it. she will call with any problems that may develop before the next visit here. Total time spent: 30 mins including face to face time and time spent for planning, charting and co-ordination of care   Caitlin Ohara, MD 01/28/22    I Gardiner Coins am acting as a Education administrator for [employer]  I have reviewed the above documentation for accuracy and completeness, and I agree with the above.

## 2022-02-17 ENCOUNTER — Other Ambulatory Visit: Payer: Self-pay | Admitting: Hematology and Oncology

## 2022-02-21 ENCOUNTER — Telehealth: Payer: Self-pay | Admitting: *Deleted

## 2022-02-21 ENCOUNTER — Other Ambulatory Visit: Payer: Self-pay | Admitting: Hematology and Oncology

## 2022-02-21 MED ORDER — TRAMADOL HCL 50 MG PO TABS: 50.0000 mg | ORAL_TABLET | Freq: Four times a day (QID) | ORAL | 0 refills | Status: DC | PRN

## 2022-02-21 NOTE — Telephone Encounter (Signed)
Received call from pt with complaint of severe joint pain and muscle spasms while on Anastrozole.  RN educated pt to stop Anastrozole x2 weeks to see if symptoms resolve.  Pt requesting to continue at this time stating she has only been on the Anastrozole x2 weeks and would like to see if it improved. Pt requesting refill for Tramadol PRN for joint paint.  RN will review with MD for refill.  Pt educated to contact the office if symptoms become worse, pt verbalized understanding.

## 2022-03-06 NOTE — Progress Notes (Signed)
HEMATOLOGY-ONCOLOGY TELEPHONE VISIT PROGRESS NOTE  I connected with our patient on 03/11/22 at  9:00 AM EST by telephone and verified that I am speaking with the correct person using two identifiers.  I discussed the limitations, risks, security and privacy concerns of performing an evaluation and management service by telephone and the availability of in person appointments.  I also discussed with the patient that there may be a patient responsible charge related to this service. The patient expressed understanding and agreed to proceed.   History of Present Illness:  Follow-up of left breast cancer on tamoxifen and iron deficiency and B12 deficiency anemias.  Oncology History  Malignant neoplasm of upper-inner quadrant of left breast in female, estrogen receptor positive ([place])  07/05/2018 Initial Diagnosis   Screening mammogram detected bilateral breast masses. Diagnostic mammogram and US showed cysts in the right breast and a 1.2cm left breast mass at the 9 o'clock position. 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    07/07/2018 Cancer Staging   Staging form: Breast, AJCC 8th Edition - Clinical stage from 07/07/2018: Stage IA (cT1c, cN0, cM0, G2, ER+, PR+, HER2-)   07/18/2018 Genetic Testing   Negative testing. No pathogenic variants identified on the [facility] Common Hereditary Cancers Panel. The Common Hereditary Cancers Panel offered by [facility] includes sequencing and/or deletion duplication testing of the following 47 genes: APC, ATM, AXIN2, BARD1, BMPR1A, BRCA1, BRCA2, BRIP1, CDH1, CDKN2A (p14ARF), CDKN2A (p16INK4a), CKD4, CHEK2, CTNNA1, DICER1, EPCAM (Deletion/duplication testing only), GREM1 (promoter region deletion/duplication testing only), KIT, MEN1, MLH1, MSH2, MSH3, MSH6, MUTYH, NBN, NF1, NHTL1, PALB2, PDGFRA, PMS2, POLD1, POLE, PTEN, RAD50, RAD51C, RAD51D, SDHB, SDHC, SDHD, SMAD4, SMARCA4. STK11, TP53, TSC1, TSC2, and VHL.  The following genes were  evaluated for sequence changes only: SDHA and HOXB13 c.251G>A variant only. The report date is 07/18/2018.   08/04/2018 Surgery   Left lumpectomy [name]) [phone]): IDC with intermediate grade DCIS, 1.2cm, grade 1, clear margins, with 3 axillary lymph nodes negative for carcinoma ER 100%, PR 60%, Ki-67 50%   08/04/2018 Oncotype testing   The Oncotype DX score was 32 predicting a risk of outside the breast recurrence over the next 9 years of 20% if the patient's only systemic therapy is tamoxifen for 5 years.    09/07/2018 - 02/14/2019 Chemotherapy   dexamethasone (DECADRON) 4 MG tablet, 1 of 1 cycle, Start date: 08/25/2018, End date: 11/01/2018  DOXOrubicin (ADRIAMYCIN) chemo injection 146 mg, 60 mg/m2 = 146 mg, Intravenous,  Once, 4 of 4 cycles. Administration: 146 mg (09/07/2018), 146 mg (09/20/2018), 146 mg (10/04/2018), 146 mg (10/18/2018)  palonosetron (ALOXI) injection 0.25 mg, 0.25 mg, Intravenous,  Once, 4 of 4 cycles. Administration: 0.25 mg (09/07/2018), 0.25 mg (09/20/2018), 0.25 mg (10/04/2018), 0.25 mg (10/18/2018)  pegfilgrastim-jmdb (FULPHILA) injection 6 mg, 6 mg, Subcutaneous,  Once, 4 of 4 cycles. Administration: 6 mg (09/09/2018), 6 mg (09/22/2018), 6 mg (10/06/2018), 6 mg (10/20/2018)  cyclophosphamide (CYTOXAN) 1,460 mg in sodium chloride 0.9 % 250 mL chemo infusion, 600 mg/m2 = 1,460 mg, Intravenous,  Once, 4 of 4 cycles. Administration: 1,460 mg (09/07/2018), 1,460 mg (09/20/2018), 1,460 mg (10/04/2018), 1,460 mg (10/18/2018)  PACLitaxel (TAXOL) 198 mg in sodium chloride 0.9 % 250 mL chemo infusion (</= '80mg'$ /m2), 80 mg/m2 = 198 mg, Intravenous,  Once, 12 of 12 cycles. Dose modification: 65 mg/m2 (original dose 80 mg/m2, Cycle 9, Reason: Provider Judgment, Comment: leukopenia), 50 mg/m2 (original dose 80 mg/m2, Cycle 14, Reason: Dose not tolerated). Administration: 198 mg (  11/01/2018), 198 mg (12/06/2018), 198 mg (12/13/2018), 156 mg (12/20/2018), 156 mg (12/27/2018), 156 mg (01/03/2019), 156 mg  (01/10/2019), 156 mg (01/17/2019), 156 mg (01/24/2019), 120 mg (01/31/2019), 120 mg (02/07/2019), 120 mg (02/14/2019)  fosaprepitant (EMEND) 150 mg  dexamethasone (DECADRON) 12 mg in sodium chloride 0.9 % 145 mL IVPB, , Intravenous,  Once, 4 of 4 cycles. Administration:  (09/07/2018),  (09/20/2018),  (10/04/2018),  (10/18/2018)   03/2019 -  Radiation Therapy   Radiation at [facility]   04/2019 -  Anti-estrogen oral therapy   Tamoxifen     REVIEW OF SYSTEMS:   Constitutional: Denies fevers, chills or abnormal weight loss All other systems were reviewed with the patient and are negative. Observations/Objective:     Assessment Plan:  Malignant neoplasm of upper-inner quadrant of left breast in female, estrogen receptor positive ([place]) 07/05/2018:Screening mammogram detected bilateral breast masses. Diagnostic mammogram and US showed cysts in the right breast and a 1.2cm left breast mass at the 9 o'clock position. 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  Stage Ia Brief course of neoadjuvant tamoxifen therapy started 07/07/2018 08/04/2018: Left lumpectomy:Left lumpectomy [name]): IDC with intermediate grade DCIS, 1.2cm, grade 1, clear margins, with 3 axillary lymph nodes negative for carcinoma ER 100%, PR 60%, Ki-67 50%   Oncotype DX score: 32, distant recurrence at 9 years with hormone therapy alone: 20%   Treatment plan: 1.  Adjuvant chemotherapy with dose dense Adriamycin and Cytoxan x4 followed by Taxol x12 completed 02/14/2019 2.  radiation therapy at [facility] started March 2021 3.  Adjuvant antiestrogen therapy with tamoxifen resumed 04/11/2019 ----------------------------------------------------------------------------------------------------------------------------- Current treatment: Tamoxifen 20 mg daily resumed 04/11/2019 (patient took tamoxifen prior to chemo) Switching to anastrozole 1 mg daily started 01/28/2022 discontinued 03/11/2022 switched to letrozole starting  03/26/2022   Anastrozole toxicities: 1.  Musculoskeletal aches and pains 2. Insomnia: Due to aches and pains 3.  Acneform rash: Sent a prescription for topical clindamycin gel   I recommend discontinuation of anastrozole and switching to letrozole starting 03/26/2022   Weight issues: She is very anxious about taking weight loss injections.  She is going to see if her bypass surgery can be revised..   Breast cancer surveillance:  Mammogram 01/16/2021: Benign breast density category B Breast exam 01/28/2021: Benign  Iron deficiency anemia 10/25/2019: Hemoglobin 13.6, B12 151, iron saturation 38%, TIBC 301, ferritin of 182: Received IV iron in September 2021 01/24/2020: Hemoglobin 13.7, MCV 98.8, RDW 13.2, iron saturation 44% 01/28/2021: Hemoglobin 13, MCV 98.3 01/28/2022: Hemoglobin 13.5, B12 277, iron saturation 31%, ferritin 39  Because of iron and B12 deficiency: Prior gastric bypass surgery    I discussed the assessment and treatment plan with the patient. The patient was provided an opportunity to ask questions and all were answered. The patient agreed with the plan and demonstrated an understanding of the instructions. The patient was advised to call back or seek an in-person evaluation if the symptoms worsen or if the condition fails to improve as anticipated.   I provided 12 minutes of non-face-to-face time during this encounter.  This includes time for charting and coordination of care   Harriette Ohara, MD  I Gardiner Coins am acting as a scribe for Dr.Vinay Gudena  I have reviewed the above documentation for accuracy and completeness, and I agree with the above.

## 2022-03-11 ENCOUNTER — Inpatient Hospital Stay: Payer: BC Managed Care – PPO | Attending: Hematology and Oncology | Admitting: Hematology and Oncology

## 2022-03-11 DIAGNOSIS — D509 Iron deficiency anemia, unspecified: Secondary | ICD-10-CM | POA: Diagnosis not present

## 2022-03-11 DIAGNOSIS — Z170 Estrogen receptor positive status [ER+]: Secondary | ICD-10-CM

## 2022-03-11 DIAGNOSIS — C50212 Malignant neoplasm of upper-inner quadrant of left female breast: Secondary | ICD-10-CM

## 2022-03-11 MED ORDER — LETROZOLE 2.5 MG PO TABS: 2.5000 mg | ORAL_TABLET | Freq: Every day | ORAL | 0 refills | Status: DC

## 2022-03-11 MED ORDER — CLINDAMYCIN PHOSPHATE 1 % EX GEL: Freq: Two times a day (BID) | CUTANEOUS | 1 refills | Status: AC

## 2022-03-11 NOTE — Assessment & Plan Note (Signed)
07/05/2018:Screening mammogram detected bilateral breast masses. Diagnostic mammogram and US showed cysts in the right breast and a 1.2cm left breast mass at the 9 o'clock position. 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  Stage Ia Brief course of neoadjuvant tamoxifen therapy started 07/07/2018 08/04/2018: Left lumpectomy:Left lumpectomy Caitlin Graham): IDC with intermediate grade DCIS, 1.2cm, grade 1, clear margins, with 3 axillary lymph nodes negative for carcinoma ER 100%, PR 60%, Ki-67 50%   Oncotype DX score: 32, distant recurrence at 9 years with hormone therapy alone: 20%   Treatment plan: 1.  Adjuvant chemotherapy with dose dense Adriamycin and Cytoxan x4 followed by Taxol x12 completed 02/14/2019 2.  radiation therapy at [facility] started March 2021 3.  Adjuvant antiestrogen therapy with tamoxifen resumed 04/11/2019 ----------------------------------------------------------------------------------------------------------------------------- Current treatment: Tamoxifen 20 mg daily resumed 04/11/2019 (patient took tamoxifen prior to chemo) Switching to anastrozole 1 mg daily starting 01/28/2022    Anastrozole toxicities: 1.  Musculoskeletal aches and pains     Weight issues: She is very anxious about taking weight loss injections.  She is going to see if her bypass surgery can be revised..   Breast cancer surveillance:  Mammogram 01/16/2021: Benign breast density category B Breast exam 01/28/2021: Benign

## 2022-03-11 NOTE — Assessment & Plan Note (Signed)
10/25/2019: Hemoglobin 13.6, B12 151, iron saturation 38%, TIBC 301, ferritin of 182: Received IV iron in September 2021 01/24/2020: Hemoglobin 13.7, MCV 98.8, RDW 13.2, iron saturation 44% 01/28/2021: Hemoglobin 13, MCV 98.3 01/28/2022: Hemoglobin 13.5, B12 277, iron saturation 31%, ferritin 39  Because of iron and B12 deficiency: Prior gastric bypass surgery

## 2022-03-12 ENCOUNTER — Telehealth: Payer: Self-pay | Admitting: Hematology and Oncology

## 2022-03-12 NOTE — Telephone Encounter (Signed)
Scheduled appointment per 3/5 los. Patient is aware of the made appointment.

## 2022-04-21 NOTE — Progress Notes (Signed)
HEMATOLOGY-ONCOLOGY TELEPHONE VISIT PROGRESS NOTE  I connected with our patient on 04/22/22 at  2:30 PM EDT by telephone and verified that I am speaking with the correct person using two identifiers.  I discussed the limitations, risks, security and privacy concerns of performing an evaluation and management service by telephone and the availability of in person appointments.  I also discussed with the patient that there may be a patient responsible charge related to this service. The patient expressed understanding and agreed to proceed.   History of Present Illness: She is tolerating letrozole extremely well without any problems to report.  She tells me that the hot flashes although they are they are much better.  Joint stiffness and achiness is also not severe.  She thinks she can handle this medication fairly well.  Oncology History  Malignant neoplasm of upper-inner quadrant of left breast in female, estrogen receptor positive  07/05/2018 Initial Diagnosis   Screening mammogram detected bilateral breast masses. Diagnostic mammogram and US showed cysts in the right breast and a 1.2cm left breast mass at the 9 o'clock position. 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    07/07/2018 Cancer Staging   Staging form: Breast, AJCC 8th Edition - Clinical stage from 07/07/2018: Stage IA (cT1c, cN0, cM0, G2, ER+, PR+, HER2-)   07/18/2018 Genetic Testing   Negative testing. No pathogenic variants identified on the [facility] Common Hereditary Cancers Panel. The Common Hereditary Cancers Panel offered by [facility] includes sequencing and/or deletion duplication testing of the following 47 genes: APC, ATM, AXIN2, BARD1, BMPR1A, BRCA1, BRCA2, BRIP1, CDH1, CDKN2A (p14ARF), CDKN2A (p16INK4a), CKD4, CHEK2, CTNNA1, DICER1, EPCAM (Deletion/duplication testing only), GREM1 (promoter region deletion/duplication testing only), KIT, MEN1, MLH1, MSH2, MSH3, MSH6, MUTYH, NBN, NF1, NHTL1,  PALB2, PDGFRA, PMS2, POLD1, POLE, PTEN, RAD50, RAD51C, RAD51D, SDHB, SDHC, SDHD, SMAD4, SMARCA4. STK11, TP53, TSC1, TSC2, and VHL.  The following genes were evaluated for sequence changes only: SDHA and HOXB13 c.251G>A variant only. The report date is 07/18/2018.   08/04/2018 Surgery   Left lumpectomy Carolynne Edouard) [phone]): IDC with intermediate grade DCIS, 1.2cm, grade 1, clear margins, with 3 axillary lymph nodes negative for carcinoma ER 100%, PR 60%, Ki-67 50%   08/04/2018 Oncotype testing   The Oncotype DX score was 32 predicting a risk of outside the breast recurrence over the next 9 years of 20% if the patient's only systemic therapy is tamoxifen for 5 years.    09/07/2018 - 02/14/2019 Chemotherapy   dexamethasone (DECADRON) 4 MG tablet, 1 of 1 cycle, Start date: 08/25/2018, End date: 11/01/2018  DOXOrubicin (ADRIAMYCIN) chemo injection 146 mg, 60 mg/m2 = 146 mg, Intravenous,  Once, 4 of 4 cycles. Administration: 146 mg (09/07/2018), 146 mg (09/20/2018), 146 mg (10/04/2018), 146 mg (10/18/2018)  palonosetron (ALOXI) injection 0.25 mg, 0.25 mg, Intravenous,  Once, 4 of 4 cycles. Administration: 0.25 mg (09/07/2018), 0.25 mg (09/20/2018), 0.25 mg (10/04/2018), 0.25 mg (10/18/2018)  pegfilgrastim-jmdb (FULPHILA) injection 6 mg, 6 mg, Subcutaneous,  Once, 4 of 4 cycles. Administration: 6 mg (09/09/2018), 6 mg (09/22/2018), 6 mg (10/06/2018), 6 mg (10/20/2018)  cyclophosphamide (CYTOXAN) 1,460 mg in sodium chloride 0.9 % 250 mL chemo infusion, 600 mg/m2 = 1,460 mg, Intravenous,  Once, 4 of 4 cycles. Administration: 1,460 mg (09/07/2018), 1,460 mg (09/20/2018), 1,460 mg (10/04/2018), 1,460 mg (10/18/2018)  PACLitaxel (TAXOL) 198 mg in sodium chloride 0.9 % 250 mL chemo infusion (</= 80mg /m2), 80 mg/m2 = 198 mg, Intravenous,  Once, 12 of 12 cycles. Dose  modification: 65 mg/m2 (original dose 80 mg/m2, Cycle 9, Reason: Provider Judgment, Comment: leukopenia), 50 mg/m2 (original dose 80 mg/m2, Cycle 14, Reason: Dose not  tolerated). Administration: 198 mg (11/01/2018), 198 mg (12/06/2018), 198 mg (12/13/2018), 156 mg (12/20/2018), 156 mg (12/27/2018), 156 mg (01/03/2019), 156 mg (01/10/2019), 156 mg (01/17/2019), 156 mg (01/24/2019), 120 mg (01/31/2019), 120 mg (02/07/2019), 120 mg (02/14/2019)  fosaprepitant (EMEND) 150 mg  dexamethasone (DECADRON) 12 mg in sodium chloride 0.9 % 145 mL IVPB, , Intravenous,  Once, 4 of 4 cycles. Administration:  (09/07/2018),  (09/20/2018),  (10/04/2018),  (10/18/2018)   03/2019 -  Radiation Therapy   Radiation at [facility]   04/2019 -  Anti-estrogen oral therapy   Tamoxifen     REVIEW OF SYSTEMS:   Constitutional: Denies fevers, chills or abnormal weight loss All other systems were reviewed with the patient and are negative. Observations/Objective:     Assessment Plan:  Malignant neoplasm of upper-inner quadrant of left breast in female, estrogen receptor positive (HCC) 07/05/2018:Screening mammogram detected bilateral breast masses. Diagnostic mammogram and US showed cysts in the right breast and a 1.2cm left breast mass at the 9 o'clock position. 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  Stage Ia Brief course of neoadjuvant tamoxifen therapy started 07/07/2018 08/04/2018: Left lumpectomy:Left lumpectomy Carolynne Edouard): IDC with intermediate grade DCIS, 1.2cm, grade 1, clear margins, with 3 axillary lymph nodes negative for carcinoma ER 100%, PR 60%, Ki-67 50%   Oncotype DX score: 32, distant recurrence at 9 years with hormone therapy alone: 20%   Treatment plan: 1.  Adjuvant chemotherapy with dose dense Adriamycin and Cytoxan x4 followed by Taxol x12 completed 02/14/2019 2.  radiation [facility] at [facility] started March 2021 3.  Adjuvant antiestrogen therapy with tamoxifen resumed 04/11/2019 ----------------------------------------------------------------------------------------------------------------------------- Current treatment: Tamoxifen 20 mg daily resumed  04/11/2019 (patient took tamoxifen prior to chemo) Switching to anastrozole 1 mg daily started 01/28/2022 discontinued 03/11/2022 switched to letrozole started 03/26/2022: She is tolerating letrozole extremely well.   Letrozole toxicities: 1.  Acneform rash: gone 2. Hot flashes: better 3.  Joint pains and stiffness: Significantly better   Weight issues: She is very anxious about taking weight loss injections.  She is going to see if her bypass surgery can be revised..   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 The patient was provided an opportunity to ask questions and all were answered. The patient agreed with the plan and demonstrated an understanding of the instructions. The patient was advised to call back or seek an in-person evaluation if the symptoms worsen or if the condition fails to improve as anticipated.   I provided 12 minutes of non-face-to-face time during this encounter.  This includes time for charting and coordination of care   Tamsen Meek, MD  I Janan Ridge am acting as a scribe for Dr.Vinay Gudena  I have reviewed the above documentation for accuracy and completeness, and I agree with the  above.

## 2022-04-22 ENCOUNTER — Telehealth: Payer: BC Managed Care – PPO | Admitting: Hematology and Oncology

## 2022-04-22 ENCOUNTER — Inpatient Hospital Stay: Payer: BC Managed Care – PPO | Attending: Hematology and Oncology | Admitting: Hematology and Oncology

## 2022-04-22 DIAGNOSIS — C50212 Malignant neoplasm of upper-inner quadrant of left female breast: Secondary | ICD-10-CM

## 2022-04-22 DIAGNOSIS — Z170 Estrogen receptor positive status [ER+]: Secondary | ICD-10-CM

## 2022-04-22 NOTE — Assessment & Plan Note (Signed)
07/05/2018:Screening mammogram detected bilateral breast masses. Diagnostic mammogram and US showed cysts in the right breast and a 1.2cm left breast mass at the 9 o'clock position. 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  Stage Ia Brief course of neoadjuvant tamoxifen therapy started 07/07/2018 08/04/2018: Left lumpectomy:Left lumpectomy Carolynne Edouard): IDC with intermediate grade DCIS, 1.2cm, grade 1, clear margins, with 3 axillary lymph nodes negative for carcinoma ER 100%, PR 60%, Ki-67 50%   Oncotype DX score: 32, distant recurrence at 9 years with hormone therapy alone: 20%   Treatment plan: 1.  Adjuvant chemotherapy with dose dense Adriamycin and Cytoxan x4 followed by Taxol x12 completed 02/14/2019 2.  radiation therapy at [facility] started March 2021 3.  Adjuvant antiestrogen therapy with tamoxifen resumed 04/11/2019 ----------------------------------------------------------------------------------------------------------------------------- Current treatment: Tamoxifen 20 mg daily resumed 04/11/2019 (patient took tamoxifen prior to chemo) Switching to anastrozole 1 mg daily started 01/28/2022 discontinued 03/11/2022 switched to letrozole starting 03/26/2022   Anastrozole toxicities: 1.  Musculoskeletal aches and pains 2. Insomnia: Due to aches and pains 3.  Acneform rash: Sent a prescription for topical clindamycin gel   I recommend discontinuation of anastrozole and switching to letrozole starting 03/26/2022   Weight issues: She is very anxious about taking weight loss injections.  She is going to see if her bypass surgery can be revised..   Breast cancer surveillance:  Mammogram 01/16/2022: Benign breast density category B Breast exam 04/22/2022: Benign   Iron deficiency anemia 10/25/2019: Hemoglobin 13.6, B12 151, iron saturation 38%, TIBC 301, ferritin of 182: Received IV iron in September 2021 01/24/2020: Hemoglobin 13.7, MCV 98.8, RDW 13.2, iron saturation  44% 01/28/2021: Hemoglobin 13, MCV 98.3 01/28/2022: Hemoglobin 13.5, B12 277, iron saturation 31%, ferritin 39   Cause of iron and B12 deficiency: Prior gastric bypass surgery

## 2022-05-14 ENCOUNTER — Other Ambulatory Visit: Payer: Self-pay | Admitting: Hematology and Oncology

## 2022-05-16 ENCOUNTER — Other Ambulatory Visit: Payer: Self-pay | Admitting: Hematology and Oncology

## 2022-05-16 ENCOUNTER — Other Ambulatory Visit: Payer: Self-pay | Admitting: *Deleted

## 2022-10-20 IMAGING — MG DIGITAL DIAGNOSTIC BILAT W/ TOMO W/ CAD
6 of 10 series · 6 of 26 positions shown · non-contrast
Comparison: Previous exam(s).

CLINICAL DATA: 55-year-old female status post malignant left
lumpectomy in 0404.

EXAM:
DIGITAL DIAGNOSTIC BILATERAL MAMMOGRAM WITH TOMOSYNTHESIS AND CAD
TECHNIQUE: Bilateral digital diagnostic mammography and breast tomosynthesis
was performed. The images were evaluated with computer-aided
detection.

[L CC (1 of 2)]
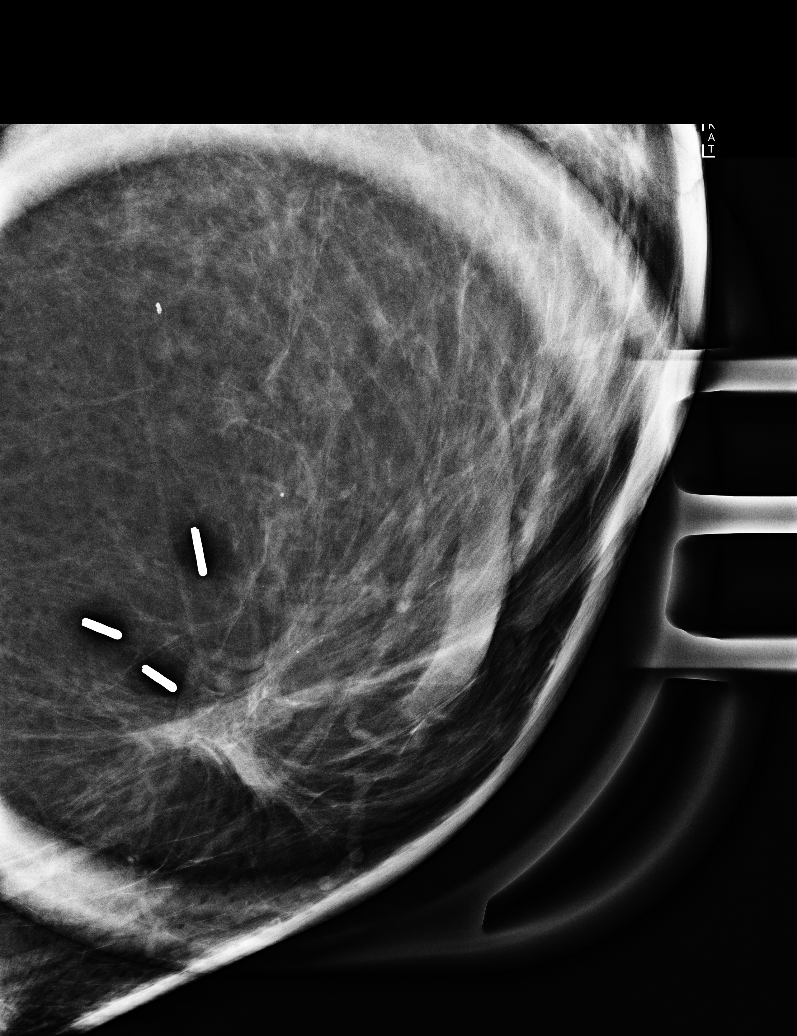

[L CC (2 of 2)]
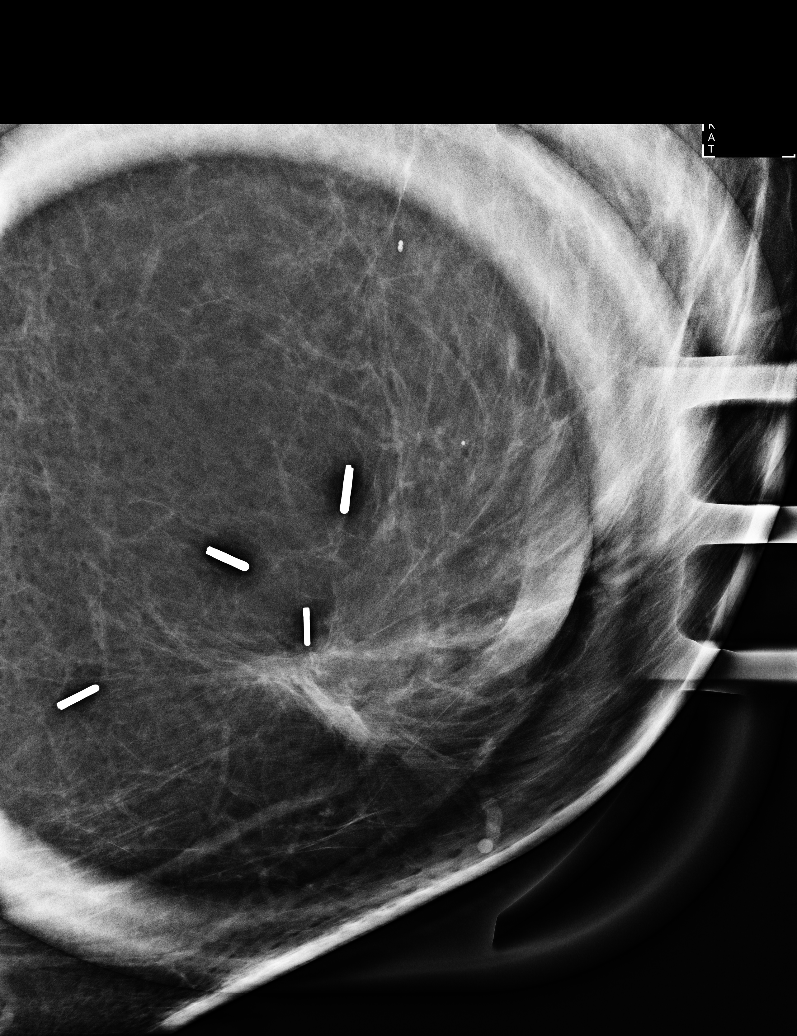

[R CC synth-2D]
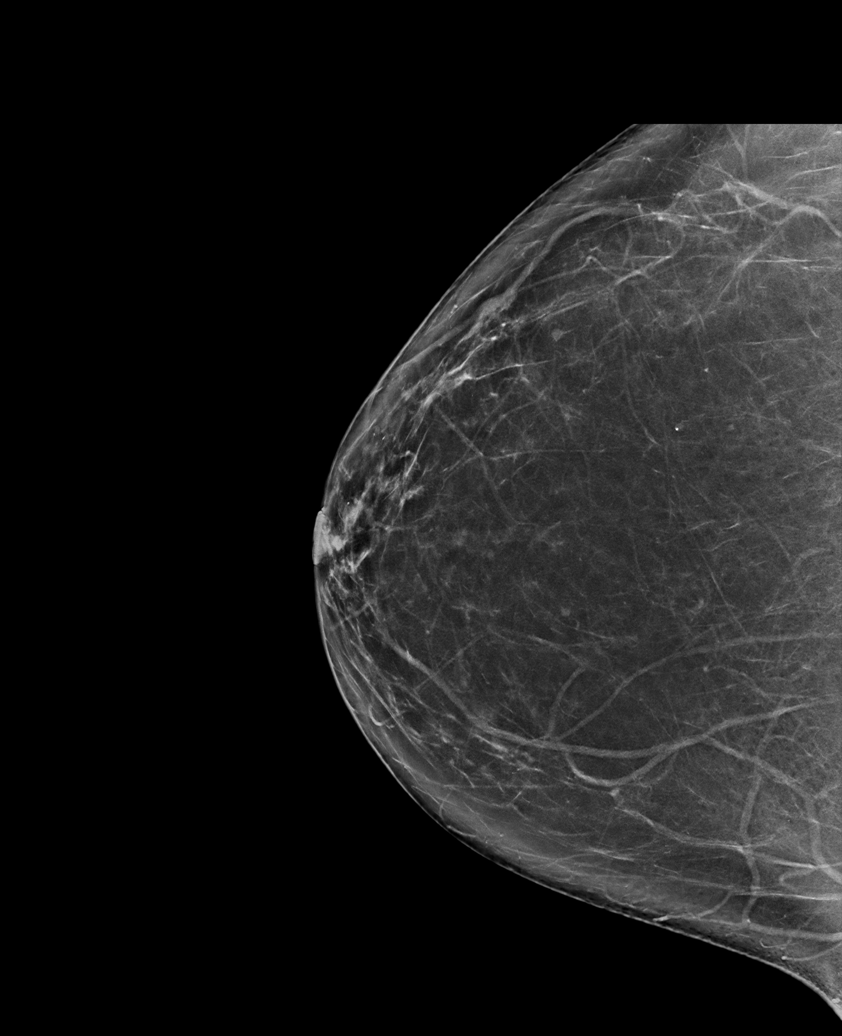

[L CC synth-2D]
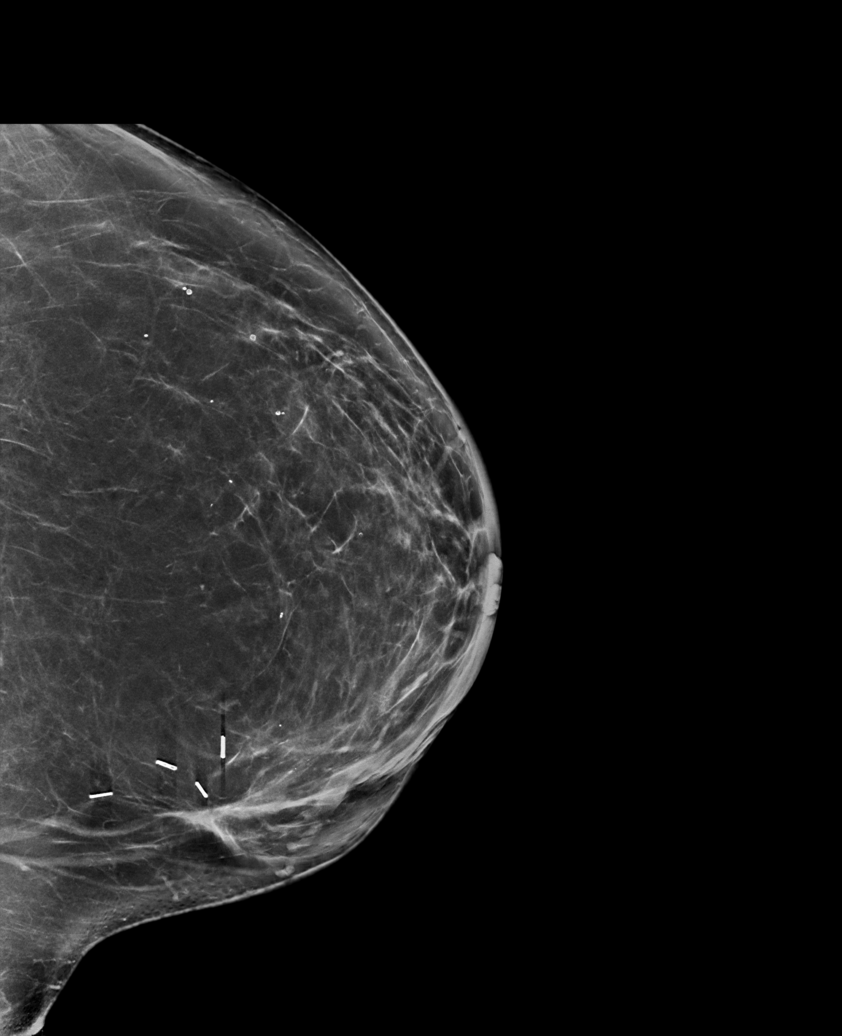

[L MLO synth-2D]
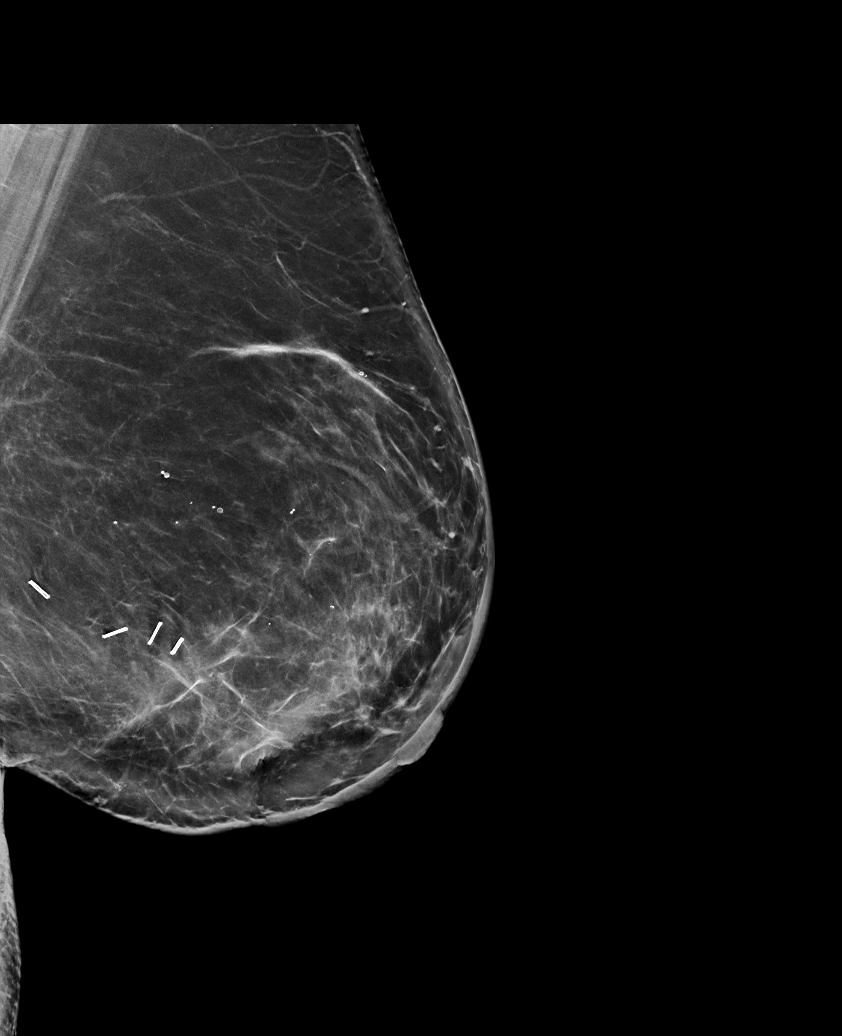

[R MLO synth-2D]
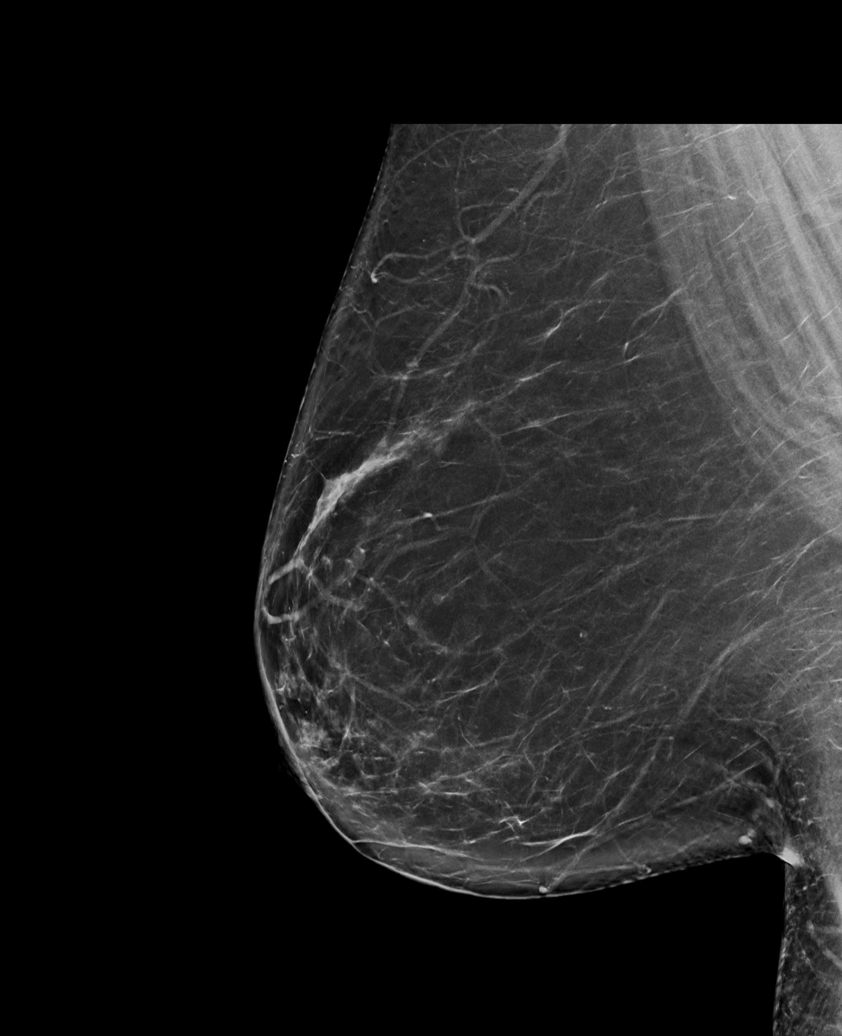

[6 of 26 positions shown; findings below may reference images not displayed]

ACR Breast Density Category b: There are scattered areas of
fibroglandular density.
FINDINGS: Stable post lumpectomy changes in the medial left breast. No new or
suspicious findings in either breast. The parenchymal pattern is
stable.
IMPRESSION: 1. No mammographic evidence of malignancy in either breast.
2. Stable left breast posttreatment changes.

RECOMMENDATION:
Per protocol, as the patient is now 2 or more years status post
lumpectomy, she may return to annual screening mammography in 1
year. However, given the history of breast cancer, the patient
remains eligible for annual diagnostic mammography if preferred.

I have discussed the findings and recommendations with the patient.
If applicable, a reminder letter will be sent to the patient
regarding the next appointment.

BI-RADS CATEGORY  2: Benign.

## 2022-10-21 NOTE — Telephone Encounter (Signed)
TC

## 2023-01-16 ENCOUNTER — Telehealth: Payer: Self-pay | Admitting: *Deleted

## 2023-01-16 NOTE — Telephone Encounter (Signed)
 Received call from pt stating her insurance plan has changed to [employer]ia [employer] this year and [facility] is not in network.  Pt requesting advice on next steps.  RN sent inbasket to Pa team for further assistance.

## 2023-01-28 ENCOUNTER — Other Ambulatory Visit: Payer: Self-pay | Admitting: *Deleted

## 2023-01-28 DIAGNOSIS — Z170 Estrogen receptor positive status [ER+]: Secondary | ICD-10-CM

## 2023-01-28 DIAGNOSIS — D509 Iron deficiency anemia, unspecified: Secondary | ICD-10-CM

## 2023-01-29 ENCOUNTER — Inpatient Hospital Stay: Payer: 59 | Attending: Hematology and Oncology | Admitting: Hematology and Oncology

## 2023-01-29 ENCOUNTER — Inpatient Hospital Stay: Payer: 59

## 2023-01-29 VITALS — BP 120/58 | HR 78 | Temp 97.6°F | Resp 18 | Ht 65.5 in | Wt 276.8 lb

## 2023-01-29 DIAGNOSIS — M25529 Pain in unspecified elbow: Secondary | ICD-10-CM | POA: Insufficient documentation

## 2023-01-29 DIAGNOSIS — R4189 Other symptoms and signs involving cognitive functions and awareness: Secondary | ICD-10-CM | POA: Diagnosis not present

## 2023-01-29 DIAGNOSIS — E538 Deficiency of other specified B group vitamins: Secondary | ICD-10-CM | POA: Diagnosis not present

## 2023-01-29 DIAGNOSIS — D509 Iron deficiency anemia, unspecified: Secondary | ICD-10-CM

## 2023-01-29 DIAGNOSIS — C50212 Malignant neoplasm of upper-inner quadrant of left female breast: Secondary | ICD-10-CM

## 2023-01-29 DIAGNOSIS — Z170 Estrogen receptor positive status [ER+]: Secondary | ICD-10-CM

## 2023-01-29 DIAGNOSIS — Z79811 Long term (current) use of aromatase inhibitors: Secondary | ICD-10-CM | POA: Diagnosis not present

## 2023-01-29 DIAGNOSIS — G8929 Other chronic pain: Secondary | ICD-10-CM | POA: Diagnosis not present

## 2023-01-29 LAB — CBC WITH DIFFERENTIAL (CANCER CENTER ONLY)
Abs Immature Granulocytes: 0.1 K/uL — ABNORMAL HIGH (ref 0.00–0.07)
Basophils Absolute: 0.1 K/uL (ref 0.0–0.1)
Basophils Relative: 1 %
Eosinophils Absolute: 0.2 K/uL (ref 0.0–0.5)
Eosinophils Relative: 2 %
HCT: 40.6 % (ref 36.0–46.0)
Hemoglobin: 13.8 g/dL (ref 12.0–15.0)
Immature Granulocytes: 1 %
Lymphocytes Relative: 28 %
Lymphs Abs: 2.3 K/uL (ref 0.7–4.0)
MCH: 31.7 pg (ref 26.0–34.0)
MCHC: 34 g/dL (ref 30.0–36.0)
MCV: 93.3 fL (ref 80.0–100.0)
Monocytes Absolute: 0.7 K/uL (ref 0.1–1.0)
Monocytes Relative: 9 %
Neutro Abs: 5 K/uL (ref 1.7–7.7)
Neutrophils Relative %: 59 %
Platelet Count: 220 K/uL (ref 150–400)
RBC: 4.35 MIL/uL (ref 3.87–5.11)
RDW: 13.2 % (ref 11.5–15.5)
WBC Count: 8.4 K/uL (ref 4.0–10.5)
nRBC: 0 % (ref 0.0–0.2)

## 2023-01-29 LAB — IRON AND IRON BINDING CAPACITY (CC-WL,HP ONLY)
Iron: 100 ug/dL (ref 28–170)
Saturation Ratios: 24 % (ref 10.4–31.8)
TIBC: 414 ug/dL (ref 250–450)
UIBC: 314 ug/dL (ref 148–442)

## 2023-01-29 LAB — FERRITIN: Ferritin: 12 ng/mL (ref 11–307)

## 2023-01-29 LAB — VITAMIN B12: Vitamin B-12: 178 pg/mL — ABNORMAL LOW (ref 180–914)

## 2023-01-29 MED ORDER — TRAMADOL HCL 50 MG PO TABS: 50.0000 mg | ORAL_TABLET | Freq: Four times a day (QID) | ORAL | 0 refills | Status: AC | PRN

## 2023-01-29 NOTE — Assessment & Plan Note (Signed)
07/05/2018:Screening mammogram detected bilateral breast masses. Diagnostic mammogram and US showed cysts in the right breast and a 1.2cm left breast mass at the 9 o'clock position. 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  Stage Ia Brief course of neoadjuvant tamoxifen therapy started 07/07/2018 08/04/2018: Left lumpectomy:Left lumpectomy Carolynne Edouard): IDC with intermediate grade DCIS, 1.2cm, grade 1, clear margins, with 3 axillary lymph nodes negative for carcinoma ER 100%, PR 60%, Ki-67 50%   Oncotype DX score: 32, distant recurrence at 9 years with hormone therapy alone: 20%   Treatment plan: 1.  Adjuvant chemotherapy with dose dense Adriamycin and Cytoxan x4 followed by Taxol x12 completed 02/14/2019 2.  radiation therapy at [facility] started March 2021 3.  Adjuvant antiestrogen therapy with tamoxifen resumed 04/11/2019 ----------------------------------------------------------------------------------------------------------------------------- Current treatment: Tamoxifen 20 mg daily resumed 04/11/2019 (patient took tamoxifen prior to chemo) Switching to anastrozole 1 mg daily started 01/28/2022 discontinued 03/11/2022 switched to letrozole started 03/26/2022: She is tolerating letrozole extremely well.   Letrozole toxicities: 1.  Acneform rash: gone 2. Hot flashes: better 3.  Joint pains and stiffness: Significantly better   Weight issues: She is very anxious about taking weight loss injections.  She is going to see if her bypass surgery can be revised..   Breast cancer surveillance:  Mammogram 01/16/2021: Benign breast density category B Breast exam 04/22/2022: Benign   Iron deficiency anemia 10/25/2019: Hemoglobin 13.6, B12 151, iron saturation 38%, TIBC 301, ferritin of 182: Received IV iron in September 2021 01/24/2020: Hemoglobin 13.7, MCV 98.8, RDW 13.2, iron saturation 44% 01/28/2021: Hemoglobin 13, MCV 98.3 01/28/2022: Hemoglobin 13.5, B12 277, iron saturation  31%, ferritin 39 01/29/2023:   Cause of iron and B12 deficiency: Prior gastric bypass surgery

## 2023-01-29 NOTE — Research (Signed)
[id number]: COGNITIVE TRAINING FOR CANCER RELATED COGNITIVE IMPAIRMENT IN BREAST CANCER SURVIVORS: A MULTI-CENTER RANDOMIZED DOUBLE- BLINDED CONTROLLED TRIAL   Met with patient to discuss this study. At this time she is unable to commit to the 4 hours/week due to commitments with her family. Plan made for me to follow up with her in May to discuss potential enrollment then.  Margret Chance Taqwa Deem, RN, BSN, [facility]  Her  [facility] Clinical Research Nurse [facility] Direct Dial [phone] 01/29/2023 10:04 AM

## 2023-01-29 NOTE — Progress Notes (Signed)
Hello I doing good to see you  Patient Care Team: Vick Frees, MD as PCP - General (Obstetrics and Gynecology) Serena Croissant, MD as Consulting Physician (Hematology and Oncology) Griselda Miner, MD as Consulting Physician (General Surgery) Lonie Peak, MD as Attending Physician (Radiation Oncology)  DIAGNOSIS:  Encounter Diagnosis  Name Primary?   Malignant neoplasm of upper-inner quadrant of left breast in female, estrogen receptor positive (HCC) Yes    SUMMARY OF ONCOLOGIC HISTORY: Oncology History  Malignant neoplasm of upper-inner quadrant of left breast in female, estrogen receptor positive (HCC)  07/05/2018 Initial Diagnosis   Screening mammogram detected bilateral breast masses. Diagnostic mammogram and US showed cysts in the right breast and a 1.2cm left breast mass at the 9 o'clock position. 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    07/07/2018 Cancer Staging   Staging form: Breast, AJCC 8th Edition - Clinical stage from 07/07/2018: Stage IA (cT1c, cN0, cM0, G2, ER+, PR+, HER2-)   07/18/2018 Genetic Testing   Negative testing. No pathogenic variants identified on the [facility] Common Hereditary Cancers Panel. The Common Hereditary Cancers Panel offered by [facility] includes sequencing and/or deletion duplication testing of the following 47 genes: APC, ATM, AXIN2, BARD1, BMPR1A, BRCA1, BRCA2, BRIP1, CDH1, CDKN2A (p14ARF), CDKN2A (p16INK4a), CKD4, CHEK2, CTNNA1, DICER1, EPCAM (Deletion/duplication testing only), GREM1 (promoter region deletion/duplication testing only), KIT, MEN1, MLH1, MSH2, MSH3, MSH6, MUTYH, NBN, NF1, NHTL1, PALB2, PDGFRA, PMS2, POLD1, POLE, PTEN, RAD50, RAD51C, RAD51D, SDHB, SDHC, SDHD, SMAD4, SMARCA4. STK11, TP53, TSC1, TSC2, and VHL.  The following genes were evaluated for sequence changes only: SDHA and HOXB13 c.251G>A variant only. The report date is 07/18/2018.   08/04/2018 Surgery   Left lumpectomy Carolynne Edouard) [phone]):  IDC with intermediate grade DCIS, 1.2cm, grade 1, clear margins, with 3 axillary lymph nodes negative for carcinoma ER 100%, PR 60%, Ki-67 50%   08/04/2018 Oncotype testing   The Oncotype DX score was 32 predicting a risk of outside the breast recurrence over the next 9 years of 20% if the patient's only systemic therapy is tamoxifen for 5 years.    09/07/2018 - 02/14/2019 Chemotherapy   dexamethasone (DECADRON) 4 MG tablet, 1 of 1 cycle, Start date: 08/25/2018, End date: 11/01/2018  DOXOrubicin (ADRIAMYCIN) chemo injection 146 mg, 60 mg/m2 = 146 mg, Intravenous,  Once, 4 of 4 cycles. Administration: 146 mg (09/07/2018), 146 mg (09/20/2018), 146 mg (10/04/2018), 146 mg (10/18/2018)  palonosetron (ALOXI) injection 0.25 mg, 0.25 mg, Intravenous,  Once, 4 of 4 cycles. Administration: 0.25 mg (09/07/2018), 0.25 mg (09/20/2018), 0.25 mg (10/04/2018), 0.25 mg (10/18/2018)  pegfilgrastim-jmdb (FULPHILA) injection 6 mg, 6 mg, Subcutaneous,  Once, 4 of 4 cycles. Administration: 6 mg (09/09/2018), 6 mg (09/22/2018), 6 mg (10/06/2018), 6 mg (10/20/2018)  cyclophosphamide (CYTOXAN) 1,460 mg in sodium chloride 0.9 % 250 mL chemo infusion, 600 mg/m2 = 1,460 mg, Intravenous,  Once, 4 of 4 cycles. Administration: 1,460 mg (09/07/2018), 1,460 mg (09/20/2018), 1,460 mg (10/04/2018), 1,460 mg (10/18/2018)  PACLitaxel (TAXOL) 198 mg in sodium chloride 0.9 % 250 mL chemo infusion (</= 80mg /m2), 80 mg/m2 = 198 mg, Intravenous,  Once, 12 of 12 cycles. Dose modification: 65 mg/m2 (original dose 80 mg/m2, Cycle 9, Reason: Provider Judgment, Comment: leukopenia), 50 mg/m2 (original dose 80 mg/m2, Cycle 14, Reason: Dose not tolerated). Administration: 198 mg (11/01/2018), 198 mg (12/06/2018), 198 mg (12/13/2018), 156 mg (12/20/2018), 156 mg (12/27/2018), 156 mg (01/03/2019), 156 mg (01/10/2019), 156 mg (01/17/2019), 156 mg (01/24/2019), 120 mg (01/31/2019),  120 mg (02/07/2019), 120 mg (02/14/2019)  fosaprepitant (EMEND) 150 mg  dexamethasone (DECADRON)  12 mg in sodium chloride 0.9 % 145 mL IVPB, , Intravenous,  Once, 4 of 4 cycles. Administration:  (09/07/2018),  (09/20/2018),  (10/04/2018),  (10/18/2018)   03/2019 -  Radiation Therapy   Radiation at [facility]   04/2019 -  Anti-estrogen oral therapy   Tamoxifen     CHIEF COMPLIANT: Follow-up on letrozole  HISTORY OF PRESENT ILLNESS:    History of Present Illness   The patient, with a history of cancer and anemia, presents with chronic fatigue and joint pain. The fatigue is reported to be constant and the patient describes feeling "tired all the time." The joint pain is described as inflammation in the elbows, knees, and hips. The patient reports difficulty standing up from a sitting position due to the pain. The patient also experiences muscle cramps, particularly on one side, which extend from under the breast into the back.  The patient is currently on Letrozole, which is suspected to be causing the joint pain and muscle stiffness. The patient reports taking Tylenol to manage the pain and has run out of Tramadol, which was previously prescribed for pain management. The patient also reports memory issues, which are causing frustration and emotional distress.  The patient has a history of anemia and is currently on iron supplements. The patient reports that the iron results have come back normal. The patient has not been taking B12 injections, which were previously prescribed, due to forgetfulness.  The patient also has a history of cancer and is on medication for the same. The patient reports being four years out from the initial diagnosis and is considered cancer-free. The patient is currently on a medication regimen, which is expected to last for seven years from the start of treatment.  The patient is overweight and has tried various weight loss methods without success. The patient reports stable weight over the past few years but expresses frustration with the inability to lose weight. The patient  has considered weight loss injections but has not pursued this due to cost and concerns about long-term effects.         ALLERGIES:  is allergic to aspirin, acetaminophen-codeine, ibuprofen, hydrocodone, oxycodone, and promethazine.  MEDICATIONS:  Current Outpatient Medications  Medication Sig Dispense Refill   acetaminophen (TYLENOL) 325 MG tablet Take 650 mg by mouth every 6 (six) hours as needed.     clindamycin (CLINDAGEL) 1 % gel Apply topically 2 (two) times daily. 30 g 1   cyanocobalamin (,VITAMIN B-12,) 1000 MCG/ML injection Inject 1 mL (1,000 mcg total) into the muscle every 30 (thirty) days. 1 mL 12   ergocalciferol (VITAMIN D2) 1.25 MG (50000 UT) capsule Take 1 capsule (50,000 Units total) by mouth once a week. 12 capsule 0   letrozole (FEMARA) 2.5 MG tablet Take 1 tablet by mouth once daily 90 tablet 3   SYRINGE/NEEDLE, DISP, 1 ML 23G X 1" 1 ML MISC 1 Syringe by Does not apply route every 30 (thirty) days. Patient to use with administration of Vitamin B12 25 each 0   traMADol (ULTRAM) 50 MG tablet Take 1 tablet (50 mg total) by mouth every 6 (six) hours as needed. 30 tablet 0   No current facility-administered medications for this visit.    PHYSICAL EXAMINATION: ECOG PERFORMANCE STATUS: 1 - Symptomatic but completely ambulatory  Vitals:   01/29/23 0820  BP: (!) 120/58  Pulse: 78  Resp: 18  Temp: 97.6 F (36.4  C)  SpO2: 99%   Filed Weights   01/29/23 0820  Weight: 276 lb 12.8 oz (125.6 kg)    Physical Exam   MEASUREMENTS: WT- 276 BREAST: Normal examination of the breast tissue with no abnormalities detected.      (exam performed in the presence of a chaperone)  LABORATORY DATA:  I have reviewed the data as listed    Latest Ref Rng & Units 01/28/2022    7:55 AM 10/25/2019   10:28 AM 08/15/2019    2:12 PM  CMP  Glucose 70 - 99 mg/dL 93  91  94   BUN 6 - 20 mg/dL 14  12  14    Creatinine 0.44 - 1.00 mg/dL 1.61  0.96  0.45   Sodium 135 - 145 mmol/L 142  140   141   Potassium 3.5 - 5.1 mmol/L 4.3  4.0  4.2   Chloride 98 - 111 mmol/L 110  105  109   CO2 22 - 32 mmol/L 27  27  25    Calcium 8.9 - 10.3 mg/dL 9.0  9.3  9.3   Total Protein 6.5 - 8.1 g/dL 5.9  7.0  7.1   Total Bilirubin 0.3 - 1.2 mg/dL 0.4  0.3  0.4   Alkaline Phos 38 - 126 U/L 63  72  73   AST 15 - 41 U/L 17  12  10    ALT 0 - 44 U/L 18  14  12      Lab Results  Component Value Date   WBC 8.4 01/29/2023   HGB 13.8 01/29/2023   HCT 40.6 01/29/2023   MCV 93.3 01/29/2023   PLT 220 01/29/2023   NEUTROABS 5.0 01/29/2023    ASSESSMENT & PLAN:  Malignant neoplasm of upper-inner quadrant of left breast in female, estrogen receptor positive (HCC) 07/05/2018:Screening mammogram detected bilateral breast masses. Diagnostic mammogram and US showed cysts in the right breast and a 1.2cm left breast mass at the 9 o'clock position. 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  Stage Ia Brief course of neoadjuvant tamoxifen therapy started 07/07/2018 08/04/2018: Left lumpectomy:Left lumpectomy Carolynne Edouard): IDC with intermediate grade DCIS, 1.2cm, grade 1, clear margins, with 3 axillary lymph nodes negative for carcinoma ER 100%, PR 60%, Ki-67 50%   Oncotype DX score: 32, distant recurrence at 9 years with hormone therapy alone: 20%   Treatment plan: 1.  Adjuvant chemotherapy with dose dense Adriamycin and Cytoxan x4 followed by Taxol x12 completed 02/14/2019 2.  radiation therapy at [facility] started March 2021 3.  Adjuvant antiestrogen therapy with tamoxifen resumed 04/11/2019 ----------------------------------------------------------------------------------------------------------------------------- Current treatment: Tamoxifen 20 mg daily resumed 04/11/2019 (patient took tamoxifen prior to chemo) Switching to anastrozole 1 mg daily started 01/28/2022 discontinued 03/11/2022 switched to letrozole started 03/26/2022: She is tolerating letrozole extremely well.   Letrozole  toxicities: 1.  Acneform rash: gone 2. Hot flashes: better 3.  Joint pains and stiffness: Significantly better   Weight issues: She is very anxious about taking weight loss injections.  She is going to see if her bypass surgery can be revised..   Breast cancer surveillance:  Mammogram 01/16/2021: Benign breast density category B Breast exam 04/22/2022: Benign   Iron deficiency anemia 10/25/2019: Hemoglobin 13.6, B12 151, iron saturation 38%, TIBC 301, ferritin of 182: Received IV iron in September 2021 01/24/2020: Hemoglobin 13.7, MCV 98.8, RDW 13.2, iron saturation 44% 01/28/2021: Hemoglobin 13, MCV 98.3 01/28/2022: Hemoglobin 13.5, B12 277, iron saturation 31%, ferritin 39 01/29/2023:  Cause of iron and B12 deficiency: Prior gastric bypass surgery  ------------------------------------- Assessment and Plan    Breast Cancer Survivor on Aromatase Inhibitor Reports significant joint pain and stiffness, possibly related to Metrozolus. Previous trial of Anastrozole was poorly tolerated due to insomnia. -Discontinue Metrozolus for two weeks to assess if symptoms improve. -Consider alternative aromatase inhibitors if symptoms improve off Metrozolus.  Anemia Stable with hemoglobin of 13.8. Iron studies pending. -Continue current management and await pending lab results.  Vitamin B12 Deficiency Non-compliant with B12 injections, possibly contributing to reported fatigue. -Encourage resumption of B12 injections to potentially improve energy levels.  Chronic Pain Reports using Tylenol frequently for joint pain and has run out of Tramadol. -Provide prescription for Tramadol for use as needed for severe pain.  Weight Management Stable weight over the past few years, but she expresses frustration with inability to lose weight. -Discuss potential future options for weight loss, including weight loss injections if they become more affordable and accessible.  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 -Refer to clinical trial investigating the use of game theory to improve memory function post-chemotherapy.  Follow-up Plan to call her in 2-3 weeks to assess response to discontinuation of Metrozolus.          Orders Placed This Encounter  Procedures   CBC with Differential (Cancer Center Only)    Standing Status:   Future    Expiration Date:   01/29/2024   Ferritin    Standing Status:   Future    Expiration Date:   01/29/2024   Iron and Iron Binding Capacity ([facility] only)    Standing Status:   Future    Expiration Date:   01/29/2024   Vitamin B12    Standing Status:   Future    Expiration Date:   01/29/2024   CMP (Cancer Center only)    Standing Status:   Future    Expiration Date:   01/29/2024   The patient has a good understanding of the overall plan. she agrees with it. she will call with any problems that may develop before the next visit here. Total time spent: 30 mins including face to face time and time spent for planning, charting and co-ordination of care   Tamsen Meek, MD 01/29/23

## 2023-02-19 ENCOUNTER — Inpatient Hospital Stay: Payer: 59 | Attending: Hematology and Oncology | Admitting: Hematology and Oncology

## 2023-02-19 DIAGNOSIS — C50212 Malignant neoplasm of upper-inner quadrant of left female breast: Secondary | ICD-10-CM

## 2023-02-19 DIAGNOSIS — Z170 Estrogen receptor positive status [ER+]: Secondary | ICD-10-CM

## 2023-02-19 MED ORDER — EXEMESTANE 25 MG PO TABS: 25.0000 mg | ORAL_TABLET | Freq: Every day | ORAL | 3 refills | Status: AC

## 2023-02-19 NOTE — Assessment & Plan Note (Addendum)
07/05/2018:Screening mammogram detected bilateral breast masses. Diagnostic mammogram and US showed cysts in the right breast and a 1.2cm left breast mass at the 9 o'clock position. 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  Stage Ia Brief course of neoadjuvant tamoxifen therapy started 07/07/2018 08/04/2018: Left lumpectomy:Left lumpectomy Carolynne Edouard): IDC with intermediate grade DCIS, 1.2cm, grade 1, clear margins, with 3 axillary lymph nodes negative for carcinoma ER 100%, PR 60%, Ki-67 50%   Oncotype DX score: 32, distant recurrence at 9 years with hormone therapy alone: 20%   Treatment plan: 1.  Adjuvant chemotherapy with dose dense Adriamycin and Cytoxan x4 followed by Taxol x12 completed 02/14/2019 2.  radiation therapy at [facility] started March 2021 3.  Adjuvant antiestrogen therapy with tamoxifen resumed 04/11/2019 ----------------------------------------------------------------------------------------------------------------------------- Current treatment: Tamoxifen 20 mg daily resumed 04/11/2019 (patient took tamoxifen prior to chemo) Switching to anastrozole 1 mg daily started 01/28/2022 discontinued 03/11/2022 switched to letrozole started 03/26/2022:    Letrozole toxicities: 1.  Hot flashes: better 2.  Joint pains and stiffness: Discontinue letrozole   Weight issues: She is very anxious about taking weight loss injections.  She is going to see if her bypass surgery can be revised..   Breast cancer surveillance:  Mammogram 01/16/2021: Benign breast density category B, patient will need annual mammograms Cognitive impairment: Refer to the clinical trial for memory loss

## 2023-02-19 NOTE — Progress Notes (Signed)
HEMATOLOGY-ONCOLOGY TELEPHONE VISIT PROGRESS NOTE  I connected with our patient on 02/19/23 at  2:15 PM EST by telephone and verified that I am speaking with the correct person using two identifiers.  I discussed the limitations, risks, security and privacy concerns of performing an evaluation and management service by telephone and the availability of in person appointments.  I also discussed with the patient that there may be a patient responsible charge related to this service. The patient expressed understanding and agreed to proceed.   History of Present Illness:    History of Present Illness   Caitlin Graham is a 58 year old female who presents with persistent illness symptoms despite negative flu and COVID tests.  She stopped letrozole 2 weeks ago and appears to be improving with less joint aches and pains.  She has been experiencing a general sense of being unwell without fever, amidst a viral illness circulating in schools. Despite testing negative for both flu and COVID-19, her symptoms persist.  She has a history of joint pain and body aches, which she associates with her previous use of anastrozole. She stopped taking anastrozole two weeks ago and notes some improvement in her symptoms, particularly the reduction of body aches at night, although she still experiences some joint pain. Her medication history includes trials of tamoxifen, letrozole, and anastrozole since April 2021, with discontinuation of letrozole and anastrozole due to side effects.     Oncology History  Malignant neoplasm of upper-inner quadrant of left breast in female, estrogen receptor positive (HCC)  07/05/2018 Initial Diagnosis   Screening mammogram detected bilateral breast masses. Diagnostic mammogram and US showed cysts in the right breast and a 1.2cm left breast mass at the 9 o'clock position. 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    07/07/2018 Cancer Staging   Staging  form: Breast, AJCC 8th Edition - Clinical stage from 07/07/2018: Stage IA (cT1c, cN0, cM0, G2, ER+, PR+, HER2-)   07/18/2018 Genetic Testing   Negative testing. No pathogenic variants identified on the [facility] Common Hereditary Cancers Panel. The Common Hereditary Cancers Panel offered by [facility] includes sequencing and/or deletion duplication testing of the following 47 genes: APC, ATM, AXIN2, BARD1, BMPR1A, BRCA1, BRCA2, BRIP1, CDH1, CDKN2A (p14ARF), CDKN2A (p16INK4a), CKD4, CHEK2, CTNNA1, DICER1, EPCAM (Deletion/duplication testing only), GREM1 (promoter region deletion/duplication testing only), KIT, MEN1, MLH1, MSH2, MSH3, MSH6, MUTYH, NBN, NF1, NHTL1, PALB2, PDGFRA, PMS2, POLD1, POLE, PTEN, RAD50, RAD51C, RAD51D, SDHB, SDHC, SDHD, SMAD4, SMARCA4. STK11, TP53, TSC1, TSC2, and VHL.  The following genes were evaluated for sequence changes only: SDHA and HOXB13 c.251G>A variant only. The report date is 07/18/2018.   08/04/2018 Surgery   Left lumpectomy Carolynne Edouard) [phone]): IDC with intermediate grade DCIS, 1.2cm, grade 1, clear margins, with 3 axillary lymph nodes negative for carcinoma ER 100%, PR 60%, Ki-67 50%   08/04/2018 Oncotype testing   The Oncotype DX score was 32 predicting a risk of outside the breast recurrence over the next 9 years of 20% if the patient's only systemic therapy is tamoxifen for 5 years.    09/07/2018 - 02/14/2019 Chemotherapy   dexamethasone (DECADRON) 4 MG tablet, 1 of 1 cycle, Start date: 08/25/2018, End date: 11/01/2018  DOXOrubicin (ADRIAMYCIN) chemo injection 146 mg, 60 mg/m2 = 146 mg, Intravenous,  Once, 4 of 4 cycles. Administration: 146 mg (09/07/2018), 146 mg (09/20/2018), 146 mg (10/04/2018), 146 mg (10/18/2018)  palonosetron (ALOXI) injection 0.25 mg, 0.25 mg, Intravenous,  Once, 4 of 4 cycles.  Administration: 0.25 mg (09/07/2018), 0.25 mg (09/20/2018), 0.25 mg (10/04/2018), 0.25 mg (10/18/2018)  pegfilgrastim-jmdb (FULPHILA) injection 6 mg, 6 mg, Subcutaneous,  Once, 4 of 4  cycles. Administration: 6 mg (09/09/2018), 6 mg (09/22/2018), 6 mg (10/06/2018), 6 mg (10/20/2018)  cyclophosphamide (CYTOXAN) 1,460 mg in sodium chloride 0.9 % 250 mL chemo infusion, 600 mg/m2 = 1,460 mg, Intravenous,  Once, 4 of 4 cycles. Administration: 1,460 mg (09/07/2018), 1,460 mg (09/20/2018), 1,460 mg (10/04/2018), 1,460 mg (10/18/2018)  PACLitaxel (TAXOL) 198 mg in sodium chloride 0.9 % 250 mL chemo infusion (</= 80mg /m2), 80 mg/m2 = 198 mg, Intravenous,  Once, 12 of 12 cycles. Dose modification: 65 mg/m2 (original dose 80 mg/m2, Cycle 9, Reason: Provider Judgment, Comment: leukopenia), 50 mg/m2 (original dose 80 mg/m2, Cycle 14, Reason: Dose not tolerated). Administration: 198 mg (11/01/2018), 198 mg (12/06/2018), 198 mg (12/13/2018), 156 mg (12/20/2018), 156 mg (12/27/2018), 156 mg (01/03/2019), 156 mg (01/10/2019), 156 mg (01/17/2019), 156 mg (01/24/2019), 120 mg (01/31/2019), 120 mg (02/07/2019), 120 mg (02/14/2019)  fosaprepitant (EMEND) 150 mg  dexamethasone (DECADRON) 12 mg in sodium chloride 0.9 % 145 mL IVPB, , Intravenous,  Once, 4 of 4 cycles. Administration:  (09/07/2018),  (09/20/2018),  (10/04/2018),  (10/18/2018)   03/2019 -  Radiation Therapy   Radiation at [facility]   04/2019 -  Anti-estrogen oral therapy   Tamoxifen     REVIEW OF SYSTEMS:   Constitutional: Denies fevers, chills or abnormal weight loss All other systems were reviewed with the patient and are negative. Observations/Objective:     Assessment Plan:  Malignant neoplasm of upper-inner quadrant of left breast in female, estrogen receptor positive (HCC) 07/05/2018:Screening mammogram detected bilateral breast masses. Diagnostic mammogram and US showed cysts in the right breast and a 1.2cm left breast mass at the 9 o'clock position. 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  Stage Ia Brief course of neoadjuvant tamoxifen therapy started 07/07/2018 08/04/2018: Left lumpectomy:Left lumpectomy  Carolynne Edouard): IDC with intermediate grade DCIS, 1.2cm, grade 1, clear margins, with 3 axillary lymph nodes negative for carcinoma ER 100%, PR 60%, Ki-67 50%   Oncotype DX score: 32, distant recurrence at 9 years with hormone therapy alone: 20%   Treatment plan: 1.  Adjuvant chemotherapy with dose dense Adriamycin and Cytoxan x4 followed by Taxol x12 completed 02/14/2019 2.  radiation therapy at [facility] started March 2021 3.  Adjuvant antiestrogen therapy with tamoxifen resumed 04/11/2019 ----------------------------------------------------------------------------------------------------------------------------- Current treatment: Tamoxifen 20 mg daily resumed 04/11/2019 (patient took tamoxifen prior to chemo) Switching to anastrozole 1 mg daily started 01/28/2022 discontinued 03/11/2022 switched to letrozole started 03/26/2022: Discontinued 02/03/2023, switched to exemestane 02/19/2023   Once she stopped letrozole she felt that the joint pains and symptoms have improved.  Unfortunately she caught a viral illness and still not feeling well.   Weight issues: She is very anxious about taking weight loss injections.  She is going to see if her bypass surgery can be revised..   Breast cancer surveillance:  Mammogram 01/16/2021: Benign breast density category B, patient will need annual mammograms Cognitive impairment: Patient did not want to do the memory loss study because of the time commitment. She will call us with how she is tolerating the exemestane.  We will see her in person in 1 year.   I discussed the assessment and treatment plan with the patient. The patient was provided an opportunity to ask questions and all were answered. The patient agreed with the plan and demonstrated an understanding of  the instructions. The patient was advised to call back or seek an in-person evaluation if the symptoms worsen or if the condition fails to improve as anticipated.   I provided 20 minutes of non-face-to-face time  during this encounter.  This includes time for charting and coordination of care   Tamsen Meek, MD

## 2023-02-20 ENCOUNTER — Telehealth: Payer: Self-pay | Admitting: Hematology and Oncology

## 2023-02-20 NOTE — Telephone Encounter (Signed)
Scheduled appointment per 2/13 los. Patient is aware of the made appointment.

## 2023-02-24 ENCOUNTER — Telehealth: Payer: Self-pay

## 2023-02-24 NOTE — Telephone Encounter (Signed)
DCP-001: Use of a Clinical Trial Screening Tool to Address Cancer Health Disparities in the [facility] [facility] Research Program [facility])  Called patient to discuss study. She in unable to discuss now and will call me back.  Margret Chance Nekeisha Aure, RN, BSN, [facility] She  Her  Hers Clinical Research Nurse [facility] Direct Dial [phone] 02/24/2023 11:51 AM

## 2023-03-03 ENCOUNTER — Telehealth: Payer: Self-pay

## 2023-03-03 NOTE — Telephone Encounter (Signed)
 DCP-001: Use of a Clinical Trial Screening Tool to Address Cancer Health Disparities in the [facility] Oncology Research Program [place])  Spoke with patient. Requested she call me back when she can discuss DCP; she verbalized understanding and stated she would call me back.  Margret Chance Deborh Pense, RN, BSN, [facility] She  Her  Hers Clinical Research Nurse [facility] Direct Dial [phone] 03/03/2023 3:16 PM

## 2024-02-01 ENCOUNTER — Inpatient Hospital Stay: Payer: 59

## 2024-02-01 ENCOUNTER — Inpatient Hospital Stay: Payer: 59 | Admitting: Hematology and Oncology

## 2024-02-22 ENCOUNTER — Ambulatory Visit: Payer: 59 | Admitting: Hematology and Oncology

## 2024-02-22 ENCOUNTER — Inpatient Hospital Stay
